# Patient Record
Sex: Female | Born: 1985 | Race: Black or African American | Hispanic: No | Marital: Married | Smoking: Current every day smoker
Health system: Southern US, Community
[De-identification: ages and names within clinical notes are randomized; demographics above are authoritative.]

## PROBLEM LIST (undated history)

## (undated) ENCOUNTER — Inpatient Hospital Stay (HOSPITAL_COMMUNITY): Payer: Self-pay

## (undated) ENCOUNTER — Emergency Department (HOSPITAL_COMMUNITY): Discharge status: Absent without leave | Payer: Medicaid Other

## (undated) DIAGNOSIS — F32A Depression, unspecified: Secondary | ICD-10-CM

## (undated) DIAGNOSIS — A64 Unspecified sexually transmitted disease: Secondary | ICD-10-CM

## (undated) DIAGNOSIS — F329 Major depressive disorder, single episode, unspecified: Secondary | ICD-10-CM

## (undated) DIAGNOSIS — Z8619 Personal history of other infectious and parasitic diseases: Secondary | ICD-10-CM

## (undated) DIAGNOSIS — N926 Irregular menstruation, unspecified: Principal | ICD-10-CM

## (undated) DIAGNOSIS — F419 Anxiety disorder, unspecified: Secondary | ICD-10-CM

## (undated) DIAGNOSIS — B009 Herpesviral infection, unspecified: Secondary | ICD-10-CM

## (undated) DIAGNOSIS — I10 Essential (primary) hypertension: Secondary | ICD-10-CM

## (undated) DIAGNOSIS — R350 Frequency of micturition: Secondary | ICD-10-CM

## (undated) DIAGNOSIS — Z3490 Encounter for supervision of normal pregnancy, unspecified, unspecified trimester: Secondary | ICD-10-CM

## (undated) HISTORY — DX: Unspecified sexually transmitted disease: A64

## (undated) HISTORY — PX: NO PAST SURGERIES: SHX2092

## (undated) HISTORY — DX: Irregular menstruation, unspecified: N92.6

## (undated) HISTORY — DX: Frequency of micturition: R35.0

## (undated) HISTORY — DX: Herpesviral infection, unspecified: B00.9

## (undated) HISTORY — DX: Personal history of other infectious and parasitic diseases: Z86.19

---
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
## 2007-08-23 ENCOUNTER — Other Ambulatory Visit: Admission: RE | Admit: 2007-08-23 | Discharge: 2007-08-23 | Payer: Self-pay | Admitting: Obstetrics and Gynecology

## 2008-01-22 ENCOUNTER — Inpatient Hospital Stay (HOSPITAL_COMMUNITY): Admission: AD | Admit: 2008-01-22 | Discharge: 2008-01-22 | Payer: Self-pay | Admitting: Obstetrics & Gynecology

## 2008-01-22 ENCOUNTER — Other Ambulatory Visit: Payer: Self-pay | Admitting: Emergency Medicine

## 2008-01-22 ENCOUNTER — Ambulatory Visit: Payer: Self-pay | Admitting: *Deleted

## 2008-02-01 ENCOUNTER — Inpatient Hospital Stay (HOSPITAL_COMMUNITY): Admission: AD | Admit: 2008-02-01 | Discharge: 2008-02-03 | Payer: Self-pay | Admitting: Obstetrics and Gynecology

## 2008-02-01 ENCOUNTER — Ambulatory Visit: Payer: Self-pay | Admitting: *Deleted

## 2008-08-31 ENCOUNTER — Other Ambulatory Visit: Admission: RE | Admit: 2008-08-31 | Discharge: 2008-08-31 | Payer: Self-pay | Admitting: Obstetrics and Gynecology

## 2009-07-25 ENCOUNTER — Inpatient Hospital Stay (HOSPITAL_COMMUNITY): Admission: AD | Admit: 2009-07-25 | Discharge: 2009-07-27 | Payer: Self-pay | Admitting: Obstetrics and Gynecology

## 2009-12-15 ENCOUNTER — Other Ambulatory Visit: Admission: RE | Admit: 2009-12-15 | Discharge: 2009-12-15 | Payer: Self-pay | Admitting: Obstetrics and Gynecology

## 2010-11-07 ENCOUNTER — Emergency Department (HOSPITAL_COMMUNITY): Admission: EM | Admit: 2010-11-07 | Discharge: 2010-11-07 | Payer: Self-pay | Admitting: Emergency Medicine

## 2011-01-07 ENCOUNTER — Encounter: Payer: Self-pay | Admitting: *Deleted

## 2011-02-28 LAB — URINALYSIS, ROUTINE W REFLEX MICROSCOPIC
Bilirubin Urine: NEGATIVE
Glucose, UA: NEGATIVE mg/dL
Hgb urine dipstick: NEGATIVE
Protein, ur: 30 mg/dL — AB
Urobilinogen, UA: 1 mg/dL (ref 0.0–1.0)

## 2011-02-28 LAB — URINE MICROSCOPIC-ADD ON

## 2011-03-14 ENCOUNTER — Emergency Department (HOSPITAL_COMMUNITY)
Admission: EM | Admit: 2011-03-14 | Discharge: 2011-03-14 | Disposition: A | Discharge status: Home / Self Care | Payer: Medicaid Other | Attending: Emergency Medicine | Admitting: Emergency Medicine

## 2011-03-14 DIAGNOSIS — N946 Dysmenorrhea, unspecified: Secondary | ICD-10-CM | POA: Insufficient documentation

## 2011-03-14 LAB — URINE MICROSCOPIC-ADD ON

## 2011-03-14 LAB — URINALYSIS, ROUTINE W REFLEX MICROSCOPIC
Bilirubin Urine: NEGATIVE
Nitrite: NEGATIVE
Specific Gravity, Urine: 1.025 (ref 1.005–1.030)
Urobilinogen, UA: 0.2 mg/dL (ref 0.0–1.0)
pH: 7 (ref 5.0–8.0)

## 2011-03-25 LAB — CBC
MCHC: 32.5 g/dL (ref 30.0–36.0)
MCV: 80.8 fL (ref 78.0–100.0)
MCV: 81.6 fL (ref 78.0–100.0)
Platelets: 196 10*3/uL (ref 150–400)
Platelets: 230 10*3/uL (ref 150–400)
RDW: 14.8 % (ref 11.5–15.5)
WBC: 17.2 10*3/uL — ABNORMAL HIGH (ref 4.0–10.5)

## 2011-03-25 LAB — COMPREHENSIVE METABOLIC PANEL
ALT: 10 U/L (ref 0–35)
AST: 27 U/L (ref 0–37)
Albumin: 2.1 g/dL — ABNORMAL LOW (ref 3.5–5.2)
Albumin: 2.6 g/dL — ABNORMAL LOW (ref 3.5–5.2)
Alkaline Phosphatase: 159 U/L — ABNORMAL HIGH (ref 39–117)
Alkaline Phosphatase: 195 U/L — ABNORMAL HIGH (ref 39–117)
BUN: 1 mg/dL — ABNORMAL LOW (ref 6–23)
CO2: 19 mEq/L (ref 19–32)
CO2: 20 mEq/L (ref 19–32)
Calcium: 8.5 mg/dL (ref 8.4–10.5)
Chloride: 109 mEq/L (ref 96–112)
Chloride: 110 mEq/L (ref 96–112)
Creatinine, Ser: 0.41 mg/dL (ref 0.4–1.2)
Creatinine, Ser: 0.54 mg/dL (ref 0.4–1.2)
GFR calc Af Amer: >60 mL/min (ref >60)
GFR calc Af Amer: >60 mL/min (ref >60)
GFR calc non Af Amer: >60 mL/min (ref >60)
GFR calc non Af Amer: >60 mL/min (ref >60)
Glucose, Bld: 109 mg/dL — ABNORMAL HIGH (ref 70–99)
Potassium: 3.3 mEq/L — ABNORMAL LOW (ref 3.5–5.1)
Sodium: 138 mEq/L (ref 135–145)
Total Bilirubin: 0.6 mg/dL (ref 0.3–1.2)

## 2011-03-25 LAB — URINALYSIS, DIPSTICK ONLY
Nitrite: NEGATIVE
Protein, ur: NEGATIVE mg/dL
Specific Gravity, Urine: 1.01 (ref 1.005–1.030)
Urobilinogen, UA: 0.2 mg/dL (ref 0.0–1.0)

## 2011-03-25 LAB — RPR: RPR Ser Ql: NONREACTIVE

## 2011-03-25 LAB — URIC ACID: Uric Acid, Serum: 6.7 mg/dL (ref 2.4–7.0)

## 2011-03-25 LAB — LACTATE DEHYDROGENASE: LDH: 168 U/L (ref 94–250)

## 2011-05-30 ENCOUNTER — Emergency Department (HOSPITAL_COMMUNITY)
Admission: EM | Admit: 2011-05-30 | Discharge: 2011-05-30 | Disposition: A | Discharge status: Home / Self Care | Payer: Medicaid Other | Attending: Emergency Medicine | Admitting: Emergency Medicine

## 2011-05-30 ENCOUNTER — Emergency Department (HOSPITAL_COMMUNITY): Payer: Medicaid Other

## 2011-05-30 DIAGNOSIS — M79609 Pain in unspecified limb: Secondary | ICD-10-CM | POA: Insufficient documentation

## 2011-05-30 DIAGNOSIS — Z79899 Other long term (current) drug therapy: Secondary | ICD-10-CM | POA: Insufficient documentation

## 2011-05-30 DIAGNOSIS — I10 Essential (primary) hypertension: Secondary | ICD-10-CM | POA: Insufficient documentation

## 2011-09-08 LAB — CBC
HCT: 26.9 — ABNORMAL LOW
Hemoglobin: 9 — ABNORMAL LOW
MCHC: 33.8
MCV: 81
Platelets: 251
Platelets: 260
RDW: 13.8
WBC: 10.3
WBC: 11.9 — ABNORMAL HIGH

## 2011-09-08 LAB — COMPREHENSIVE METABOLIC PANEL
AST: 17
Albumin: 2.3 — ABNORMAL LOW
Albumin: 2.5 — ABNORMAL LOW
Alkaline Phosphatase: 99
BUN: 5 — ABNORMAL LOW
Calcium: 8.5
Chloride: 103
Creatinine, Ser: 0.46
GFR calc Af Amer: >60
Potassium: 3.6
Total Bilirubin: 0.7
Total Protein: 6.7

## 2011-09-08 LAB — URINE CULTURE

## 2011-09-08 LAB — URINALYSIS, ROUTINE W REFLEX MICROSCOPIC
Bilirubin Urine: NEGATIVE
Bilirubin Urine: NEGATIVE
Glucose, UA: NEGATIVE
Ketones, ur: NEGATIVE
Protein, ur: 100 — AB
Specific Gravity, Urine: 1.02
pH: 7

## 2011-09-08 LAB — URINE MICROSCOPIC-ADD ON

## 2011-09-08 LAB — RAPID URINE DRUG SCREEN, HOSP PERFORMED
Amphetamines: NOT DETECTED
Barbiturates: NOT DETECTED
Benzodiazepines: NOT DETECTED
Opiates: NOT DETECTED

## 2011-11-13 ENCOUNTER — Other Ambulatory Visit (HOSPITAL_COMMUNITY)
Admission: RE | Admit: 2011-11-13 | Discharge: 2011-11-13 | Disposition: A | Discharge status: Home / Self Care | Payer: Medicaid Other | Source: Ambulatory Visit | Attending: Obstetrics and Gynecology | Admitting: Obstetrics and Gynecology

## 2011-11-13 ENCOUNTER — Other Ambulatory Visit: Payer: Self-pay | Admitting: Adult Health

## 2011-11-13 DIAGNOSIS — Z113 Encounter for screening for infections with a predominantly sexual mode of transmission: Secondary | ICD-10-CM | POA: Insufficient documentation

## 2011-11-13 DIAGNOSIS — Z01419 Encounter for gynecological examination (general) (routine) without abnormal findings: Secondary | ICD-10-CM | POA: Insufficient documentation

## 2011-11-13 LAB — OB RESULTS CONSOLE GC/CHLAMYDIA: Gonorrhea: NEGATIVE

## 2011-11-13 LAB — OB RESULTS CONSOLE RUBELLA ANTIBODY, IGM: Rubella: IMMUNE

## 2011-11-13 LAB — OB RESULTS CONSOLE ANTIBODY SCREEN: Antibody Screen: NEGATIVE

## 2011-11-13 LAB — OB RESULTS CONSOLE ABO/RH

## 2012-02-15 ENCOUNTER — Emergency Department (HOSPITAL_COMMUNITY): Payer: Medicaid Other

## 2012-02-15 ENCOUNTER — Encounter (HOSPITAL_COMMUNITY): Payer: Self-pay

## 2012-02-15 ENCOUNTER — Emergency Department (HOSPITAL_COMMUNITY)
Admission: EM | Admit: 2012-02-15 | Discharge: 2012-02-15 | Disposition: A | Discharge status: Home / Self Care | Payer: Medicaid Other | Attending: Emergency Medicine | Admitting: Emergency Medicine

## 2012-02-15 DIAGNOSIS — F17200 Nicotine dependence, unspecified, uncomplicated: Secondary | ICD-10-CM | POA: Insufficient documentation

## 2012-02-15 DIAGNOSIS — O99891 Other specified diseases and conditions complicating pregnancy: Secondary | ICD-10-CM | POA: Insufficient documentation

## 2012-02-15 DIAGNOSIS — R109 Unspecified abdominal pain: Secondary | ICD-10-CM | POA: Insufficient documentation

## 2012-02-15 LAB — DIFFERENTIAL
Basophils Absolute: 0 10*3/uL (ref 0.0–0.1)
Basophils Relative: 0 % (ref 0–1)
Eosinophils Absolute: 0.2 10*3/uL (ref 0.0–0.7)
Eosinophils Relative: 2 % (ref 0–5)
Monocytes Absolute: 0.6 10*3/uL (ref 0.1–1.0)
Monocytes Relative: 5 % (ref 3–12)

## 2012-02-15 LAB — URINE MICROSCOPIC-ADD ON

## 2012-02-15 LAB — URINALYSIS, ROUTINE W REFLEX MICROSCOPIC
Bilirubin Urine: NEGATIVE
Hgb urine dipstick: NEGATIVE
Protein, ur: 30 mg/dL — AB
Urobilinogen, UA: 1 mg/dL (ref 0.0–1.0)

## 2012-02-15 LAB — CBC
HCT: 32.9 % — ABNORMAL LOW (ref 36.0–46.0)
Hemoglobin: 11 g/dL — ABNORMAL LOW (ref 12.0–15.0)
MCH: 28.4 pg (ref 26.0–34.0)
MCHC: 33.4 g/dL (ref 30.0–36.0)
RDW: 13.4 % (ref 11.5–15.5)

## 2012-02-15 LAB — COMPREHENSIVE METABOLIC PANEL
Albumin: 2.8 g/dL — ABNORMAL LOW (ref 3.5–5.2)
BUN: 8 mg/dL (ref 6–23)
Calcium: 9.4 mg/dL (ref 8.4–10.5)
Creatinine, Ser: 0.43 mg/dL — ABNORMAL LOW (ref 0.50–1.10)
Total Bilirubin: 0.3 mg/dL (ref 0.3–1.2)
Total Protein: 7.1 g/dL (ref 6.0–8.3)

## 2012-02-15 NOTE — ED Notes (Signed)
Pt reports lower abd pain that began this a.m.  Pt denies any vaginal bleeding or discharge.  Pt reports the pains are sharp and her lower abd.  This is pt's 3rd pregnancy.

## 2012-02-15 NOTE — ED Notes (Signed)
[facility], they requested for [place] to put her on the toco monitor to see if she is contracting.

## 2012-02-15 NOTE — ED Provider Notes (Signed)
History     CSN: [id number]  Arrival date & time 02/15/12  1453   First MD Initiated Contact with Patient 02/15/12 [phone]      Chief Complaint  Patient presents with  . Abdominal Pain    (Consider location/radiation/quality/duration/timing/severity/associated sxs/prior treatment) HPI Comments: Patient is 5 months pregnant, started with sharp pains in the lower abd this morning.  Went away, them came back this afternoon.  No bowel or bladder complaints.    Patient is a 26 y.o. female presenting with cramps. The history is provided by the patient.  Abdominal Cramping The primary symptoms of the illness include abdominal pain. The primary symptoms of the illness do not include fever, nausea, vomiting, dysuria, vaginal discharge or vaginal bleeding. The problem has been gradually worsening.  The patient states that she believes she is currently pregnant. The patient has not had a change in bowel habit. Symptoms associated with the illness do not include chills.    History reviewed. No pertinent past medical history.  History reviewed. No pertinent past surgical history.  History reviewed. No pertinent family history.  History  Substance Use Topics  . Smoking status: Current Everyday Smoker    Types: Cigarettes  . Smokeless tobacco: Not on file  . Alcohol Use: No    OB History    Grav Para Term Preterm Abortions TAB SAB Ect Mult Living   3 2 2  0 0 0 0 0 0 2      Review of Systems  Constitutional: Negative for fever and chills.  Gastrointestinal: Positive for abdominal pain. Negative for nausea and vomiting.  Genitourinary: Negative for dysuria, vaginal bleeding and vaginal discharge.  All other systems reviewed and are negative.    Allergies  Motrin  Home Medications  No current outpatient prescriptions on file.  BP 115/78  Pulse 102  Temp(Src) 98.1 F (36.7 C) (Oral)  Resp 17  Ht 5\' 2"  (1.575 m)  Wt 171 lb 8 oz (77.792 kg)  BMI 31.37 kg/m2  SpO2  100%  Physical Exam  Nursing note and vitals reviewed. Constitutional: She is oriented to person, place, and time. She appears well-developed and well-nourished. No distress.  HENT:  Head: Normocephalic and atraumatic.  Neck: Normal range of motion. Neck supple.  Cardiovascular: Normal rate and regular rhythm.  Exam reveals no gallop and no friction rub.   No murmur heard. Pulmonary/Chest: Effort normal and breath sounds normal. No respiratory distress. She has no wheezes.  Abdominal: Soft. Bowel sounds are normal.       There is mild ttp suprapubically.  No rebound or guarding.  The fundal height is consistent with gestational age.  Musculoskeletal: Normal range of motion.  Neurological: She is alert and oriented to person, place, and time.  Skin: Skin is warm and dry. She is not diaphoretic.    ED Course  Procedures (including critical care time)   Labs Reviewed  URINALYSIS, ROUTINE W REFLEX MICROSCOPIC   No results found.   No diagnosis found.    MDM  The patient was monitored and was not contracting.  The urine showed slight protein but was okay otherwise.  The blood pressure is normal.  The ultrasound showed no complications.  Will discharge to home, to follow up prn.        Geoffery Lyons, MD 02/15/12 1758

## 2012-02-15 NOTE — Discharge Instructions (Signed)
Abdominal Pain During Pregnancy Abdominal discomfort is common in pregnancy. Most of the time, it does not cause harm. There are many causes of abdominal pain. Some causes are more serious than others. Some of the causes of abdominal pain in pregnancy are easily diagnosed. Occasionally, the diagnosis takes time to understand. Other times, the cause is not determined. Abdominal pain can be a sign that something is very wrong with the pregnancy, or the pain may have nothing to do with the pregnancy at all. For this reason, always tell your caregiver if you have any abdominal discomfort. CAUSES Common and harmless causes of abdominal pain include:  Constipation.   Excess gas and bloating.   Round ligament pain. This is pain that is felt in the folds of the groin.   The position the baby or placenta is in.   Baby kicks.   Braxton-Hicks contractions. These are mild contractions that do not cause cervical dilation.  Serious causes of abdominal pain include:  Ectopic pregnancy. This happens when a fertilized egg implants outside of the uterus.   Miscarriage.   Preterm labor. This is when labor starts at less than 37 weeks of pregnancy.   Placental abruption. This is when the placenta partially or completely separates from the uterus.   Preeclampsia. This is often associated with high blood pressure and has been referred to as "toxemia in pregnancy."   Uterine or amniotic fluid infections.  Causes unrelated to pregnancy include:  Urinary tract infection.   Gallbladder stones or inflammation.   Hepatitis or other liver illness.   Intestinal problems, stomach flu, food poisoning, or ulcer.   Appendicitis.   Kidney (renal) stones.   Kidney infection (pylonephritis).  HOME CARE INSTRUCTIONS  For mild pain:  Do not have sexual intercourse or put anything in your vagina until your symptoms go away completely.   Get plenty of rest until your pain improves. If your pain does not  improve in 1 hour, call your caregiver.   Drink clear fluids if you feel nauseous. Avoid solid food as long as you are uncomfortable or nauseous.   Only take medicine as directed by your caregiver.   Keep all follow-up appointments with your caregiver.  SEEK IMMEDIATE MEDICAL CARE IF:  You are bleeding, leaking fluid, or passing tissue from the vagina.   You have increasing pain or cramping.   You have persistent vomiting.   You have painful or bloody urination.   You have a fever.   You notice a decrease in your baby's movements.   You have extreme weakness or feel faint.   You have shortness of breath, with or without abdominal pain.   You develop a severe headache with abdominal pain.   You have abnormal vaginal discharge with abdominal pain.   You have persistent diarrhea.   You have abdominal pain that continues even after rest, or gets worse.  MAKE SURE YOU:   Understand these instructions.   Will watch your condition.   Will get help right away if you are not doing well or get worse.  Document Released: 12/04/2005 Document Revised: 08/16/2011 Document Reviewed: 06/30/2011 Exit[employer] Patient Information 2012 [employer]. 

## 2012-02-15 NOTE — Progress Notes (Addendum)
Called by [facility] to advise of Stephanie Cook arrival.  Upon apprisel of [facility], informed [facility] RN that Doppler tones should be assessed but EFM will likely not be an effective means of monitoring.  Requested that toco be applied, as the pt's presenting complaint is abdominal pain.  Will monitor remotely.

## 2012-02-15 NOTE — ED Notes (Signed)
Spoke with Joy at [place].  She states that she doesn't see any reason to keep pt on the monitor.  Notified edp

## 2012-02-15 NOTE — ED Notes (Signed)
Complain of pain in pelvic area that started around 1000 this morning. Denies other symptoms

## 2012-06-21 ENCOUNTER — Telehealth (HOSPITAL_COMMUNITY): Payer: Self-pay | Admitting: *Deleted

## 2012-06-21 ENCOUNTER — Encounter (HOSPITAL_COMMUNITY): Payer: Self-pay | Admitting: *Deleted

## 2012-06-21 NOTE — Telephone Encounter (Signed)
Preadmission screen  

## 2012-12-18 NOTE — L&D Delivery Note (Signed)
Delivery Note At 7:49 AM a viable female was delivered via Vaginal, Spontaneous Delivery (Presentation: Occiput Anterior).  APGAR: 9, 9; weight pending .  Placenta status: spontaneous, intact.  Cord: 3 vessels with the following complications: None.  Cord pH: not sent.  Parents bonding with infant.  Anesthesia: Epidural  Episiotomy: None Lacerations: None Suture Repair: N/A Est. Blood Loss (mL):   Mom to postpartum.  Baby to Couplet care / Skin to Skin.  [name] 10/26/2013, 8:33 AM  I attended this delivery with student No difficulty with shoulders. Baby vigorous. Agree with note. Aviva Signs, CNM

## 2013-01-04 ENCOUNTER — Encounter (HOSPITAL_COMMUNITY): Payer: Self-pay

## 2013-01-04 ENCOUNTER — Emergency Department (HOSPITAL_COMMUNITY)
Admission: EM | Admit: 2013-01-04 | Discharge: 2013-01-04 | Disposition: A | Discharge status: Home / Self Care | Payer: Medicaid Other | Attending: Emergency Medicine | Admitting: Emergency Medicine

## 2013-01-04 DIAGNOSIS — R197 Diarrhea, unspecified: Secondary | ICD-10-CM | POA: Insufficient documentation

## 2013-01-04 DIAGNOSIS — I10 Essential (primary) hypertension: Secondary | ICD-10-CM | POA: Insufficient documentation

## 2013-01-04 DIAGNOSIS — Z331 Pregnant state, incidental: Secondary | ICD-10-CM

## 2013-01-04 DIAGNOSIS — F17200 Nicotine dependence, unspecified, uncomplicated: Secondary | ICD-10-CM | POA: Insufficient documentation

## 2013-01-04 DIAGNOSIS — R112 Nausea with vomiting, unspecified: Secondary | ICD-10-CM

## 2013-01-04 HISTORY — DX: Essential (primary) hypertension: I10

## 2013-01-04 LAB — URINALYSIS, ROUTINE W REFLEX MICROSCOPIC
Bilirubin Urine: NEGATIVE
Ketones, ur: NEGATIVE mg/dL
Nitrite: NEGATIVE
Specific Gravity, Urine: >1.03 — ABNORMAL HIGH (ref 1.005–1.030)
Urobilinogen, UA: 0.2 mg/dL (ref 0.0–1.0)

## 2013-01-04 MED ORDER — ONDANSETRON HCL 4 MG PO TABS: 4.0000 mg | ORAL_TABLET | Freq: Three times a day (TID) | ORAL | Status: DC | PRN

## 2013-01-04 MED ORDER — PRENATAL COMPLETE 14-0.4 MG PO TABS: 1.0000 | ORAL_TABLET | Freq: Every day | ORAL | Status: DC

## 2013-01-04 MED ORDER — ONDANSETRON HCL 4 MG/2ML IJ SOLN: 4.0000 mg | INTRAMUSCULAR | Status: DC | PRN

## 2013-01-04 MED ORDER — SODIUM CHLORIDE 0.9 % IV BOLUS (SEPSIS): 500.0000 mL | Freq: Once | INTRAVENOUS | Status: DC

## 2013-01-04 MED ORDER — SODIUM CHLORIDE 0.9 % IV SOLN: INTRAVENOUS | Status: DC

## 2013-01-04 NOTE — ED Notes (Signed)
Sprite given for oral trial.  

## 2013-01-04 NOTE — ED Notes (Signed)
Pt stated prior to d/c that she took a pregnancy test this am and came here to make sure. Nad. Held sprite down fine

## 2013-01-04 NOTE — ED Notes (Signed)
abdominal pain and vomiting

## 2013-01-04 NOTE — ED Provider Notes (Signed)
History     CSN: [id number]  Arrival date & time 01/04/13  1433   First MD Initiated Contact with Patient 01/04/13 1444      Chief Complaint  Patient presents with  . Abdominal Pain    HPI Pt was seen at 1445.  Per pt, c/o gradual onset and persistence of multiple intermittent episodes of N/V/D that began this morning.   Describes the 3 diarrhea stools she had today as "watery."  Has been associated with generalized "cramping" abd pain. Denies CP/SOB, no back pain, no fevers, no black or blood in stools or emesis, no vaginal bleeding/discharge, no dysuria.     Past Medical History  Diagnosis Date  . Hypertension     History reviewed. No pertinent past surgical history.   History  Substance Use Topics  . Smoking status: Current Every Day Smoker    Types: Cigarettes  . Smokeless tobacco: Not on file  . Alcohol Use: No    OB History    Grav Para Term Preterm Abortions TAB SAB Ect Mult Living   3 2 2  0 0 0 0 0 0 2      Review of Systems ROS: Statement: All systems negative except as marked or noted in the HPI; Constitutional: Negative for fever and chills. ; ; Eyes: Negative for eye pain, redness and discharge. ; ; ENMT: Negative for ear pain, hoarseness, nasal congestion, sinus pressure and sore throat. ; ; Cardiovascular: Negative for chest pain, palpitations, diaphoresis, dyspnea and peripheral edema. ; ; Respiratory: Negative for cough, wheezing and stridor. ; ; Gastrointestinal: +N/V/D, abd pain. Negative for blood in stool, hematemesis, jaundice and rectal bleeding. . ; ; Genitourinary: Negative for dysuria, flank pain and hematuria. ; ; GYN:  No vaginal bleeding, no vaginal discharge, no vulvar pain.;; Musculoskeletal: Negative for back pain and neck pain. Negative for swelling and trauma.; ; Skin: Negative for pruritus, rash, abrasions, blisters, bruising and skin lesion.; ; Neuro: Negative for headache, lightheadedness and neck stiffness. Negative for weakness, altered  level of consciousness , altered mental status, extremity weakness, paresthesias, involuntary movement, seizure and syncope.       Allergies  Motrin  Home Medications  No current outpatient prescriptions on file.  BP 129/92  Pulse 88  Temp 97.4 F (36.3 C) (Oral)  Resp 20  Ht 5\' 2"  (1.575 m)  Wt 149 lb (67.586 kg)  BMI 27.25 kg/m2  SpO2 100%  Physical Exam 1450: Physical examination:  Nursing notes reviewed; Vital signs and O2 SAT reviewed;  Constitutional: Well developed, Well nourished, Well hydrated, In no acute distress; Head:  Normocephalic, atraumatic; Eyes: EOMI, PERRL, No scleral icterus; ENMT: Mouth and pharynx normal, Mucous membranes moist; Neck: Supple, Full range of motion, No lymphadenopathy; Cardiovascular: Regular rate and rhythm, No murmur, rub, or gallop; Respiratory: Breath sounds clear & equal bilaterally, No rales, rhonchi, wheezes.  Speaking full sentences with ease, Normal respiratory effort/excursion; Chest: Nontender, Movement normal; Abdomen: Soft. Nontender. Nondistended, Normal bowel sounds; Genitourinary: No CVA tenderness; Extremities: Pulses normal, No tenderness, No edema, No calf edema or asymmetry.; Neuro: AA&Ox3, Major CN grossly intact.  Speech clear. Climbs on and off stretcher by herself without difficulty. Gait steady. No gross focal motor or sensory deficits in extremities.; Skin: Color normal, Warm, Dry.   ED Course  Procedures      MDM  MDM Reviewed: nursing note, vitals and previous chart Interpretation: labs     Results for orders placed during the hospital encounter of 01/04/13  URINALYSIS,  ROUTINE W REFLEX MICROSCOPIC      Component Value Range   Color, Urine YELLOW  YELLOW   APPearance CLEAR  CLEAR   Specific Gravity, Urine >1.030 (*) 1.005 - 1.030   pH 6.0  5.0 - 8.0   Glucose, UA NEGATIVE  NEGATIVE mg/dL   Hgb urine dipstick NEGATIVE  NEGATIVE   Bilirubin Urine NEGATIVE  NEGATIVE   Ketones, ur NEGATIVE  NEGATIVE mg/dL     Protein, ur NEGATIVE  NEGATIVE mg/dL   Urobilinogen, UA 0.2  0.0 - 1.0 mg/dL   Nitrite NEGATIVE  NEGATIVE   Leukocytes, UA NEGATIVE  NEGATIVE  PREGNANCY, URINE      Component Value Range   Preg Test, Ur POSITIVE (*) NEGATIVE     1520:  Pt refuses blood work, IVF, zofran. Now states to myself and ED RN that she came to the ED today because she "just wanted a pregnancy test."  States her LMP was the 1st week of December (last month), and she took a home pregnancy test this morning that "was positive." Came to the ED today only to confirm her findings. Denies abd and pelvic pain as her reason for coming to the ED. Continues to refuse labs, IVF, anti-emetic, stating to myself and ED RN that she was just here for a pregnancy test.  Abd continues benign on re-exam.  Has been playing on her handheld electronic device, watching TV and laughing/talking with significant other at bedside without distress.  Has tol PO well while in the ED without N/V.  Wants to go home now.  Dx and testing d/w pt and significant other.  Questions answered.  Verb understanding, agreeable to d/c home with outpt f/u.            Laray Anger, DO 01/06/13 1302

## 2013-01-09 ENCOUNTER — Encounter (HOSPITAL_COMMUNITY): Payer: Self-pay | Admitting: Emergency Medicine

## 2013-01-09 ENCOUNTER — Emergency Department (HOSPITAL_COMMUNITY)
Admission: EM | Admit: 2013-01-09 | Discharge: 2013-01-09 | Disposition: A | Discharge status: Home / Self Care | Payer: Medicaid Other | Attending: Emergency Medicine | Admitting: Emergency Medicine

## 2013-01-09 DIAGNOSIS — O209 Hemorrhage in early pregnancy, unspecified: Secondary | ICD-10-CM | POA: Insufficient documentation

## 2013-01-09 DIAGNOSIS — R109 Unspecified abdominal pain: Secondary | ICD-10-CM | POA: Insufficient documentation

## 2013-01-09 DIAGNOSIS — O9989 Other specified diseases and conditions complicating pregnancy, childbirth and the puerperium: Secondary | ICD-10-CM | POA: Insufficient documentation

## 2013-01-09 DIAGNOSIS — H9209 Otalgia, unspecified ear: Secondary | ICD-10-CM | POA: Insufficient documentation

## 2013-01-09 DIAGNOSIS — I10 Essential (primary) hypertension: Secondary | ICD-10-CM | POA: Insufficient documentation

## 2013-01-09 DIAGNOSIS — R350 Frequency of micturition: Secondary | ICD-10-CM | POA: Insufficient documentation

## 2013-01-09 DIAGNOSIS — N949 Unspecified condition associated with female genital organs and menstrual cycle: Secondary | ICD-10-CM | POA: Insufficient documentation

## 2013-01-09 DIAGNOSIS — R197 Diarrhea, unspecified: Secondary | ICD-10-CM | POA: Insufficient documentation

## 2013-01-09 DIAGNOSIS — O99330 Smoking (tobacco) complicating pregnancy, unspecified trimester: Secondary | ICD-10-CM | POA: Insufficient documentation

## 2013-01-09 LAB — WET PREP, GENITAL: WBC, Wet Prep HPF POC: NONE SEEN

## 2013-01-09 LAB — HCG, QUANTITATIVE, PREGNANCY: hCG, Beta Chain, Quant, S: 755 m[IU]/mL — ABNORMAL HIGH (ref <5)

## 2013-01-09 NOTE — ED Provider Notes (Signed)
History     CSN: [id number]  Arrival date & time 01/09/13  2136   None     Chief Complaint  Patient presents with  . Vaginal Bleeding  . Abdominal Pain   HPI Stephanie Cook is a 27 y.o. G4 P3 @ approximately 7 weeks 3 days gestation who presents to the ED with vaginal bleeding. The bleeding started today. She describes the bleeding as lighter than a period. LMP 11/17/12. Last pap smear more than one year ago with Dr. Emelda Fear and was normal. Vaginal deliver x 3. Two full term and one delivery at [redacted] weeks gestation. Current sex partner x 8 months. Hx of trichomonas. No birth control. Last sexual intercourse 2 hours prior to arrival to the ED and bleeding started after that. Mild cramping after intercourse.The history was provided by the patient.   Past Medical History  Diagnosis Date  . Hypertension     History reviewed. No pertinent past surgical history.  History reviewed. No pertinent family history.  History  Substance Use Topics  . Smoking status: Current Every Day Smoker    Types: Cigarettes  . Smokeless tobacco: Not on file  . Alcohol Use: No    OB History    Grav Para Term Preterm Abortions TAB SAB Ect Mult Living   4 2 2  0 0 0 0 0 0 2      Review of Systems  Constitutional: Negative for fever, chills, diaphoresis and fatigue.  HENT: Positive for ear pain. Negative for congestion, sore throat, facial swelling, neck pain, neck stiffness, dental problem and sinus pressure.   Eyes: Negative for photophobia, pain and discharge.  Respiratory: Negative for cough, chest tightness and wheezing.   Gastrointestinal: Positive for abdominal pain (cramping) and diarrhea. Negative for nausea, vomiting, constipation and abdominal distention.  Genitourinary: Positive for frequency, vaginal bleeding and pelvic pain. Negative for dysuria, flank pain, vaginal discharge, difficulty urinating and vaginal pain.  Musculoskeletal: Negative for myalgias, back pain and gait problem.    Skin: Negative for color change and rash.  Neurological: Negative for dizziness, speech difficulty, weakness, light-headedness, numbness and headaches.  Psychiatric/Behavioral: Negative for confusion and agitation. The patient is not nervous/anxious.     Allergies  Motrin  Home Medications   Current Outpatient Rx  Name  Route  Sig  Dispense  Refill  . ONDANSETRON HCL 4 MG PO TABS   Oral   Take 1 tablet (4 mg total) by mouth every 8 (eight) hours as needed for nausea.   6 tablet   0   . PRENATAL COMPLETE 14-0.4 MG PO TABS   Oral   Take 1 tablet by mouth daily.   30 each   0     BP 150/105  Pulse 102  Temp 98.4 F (36.9 C) (Oral)  Resp 20  Ht 5\' 2"  (1.575 m)  Wt 164 lb (74.39 kg)  BMI 30.00 kg/m2  SpO2 100%  LMP 11/17/2012  Physical Exam  Nursing note and vitals reviewed. Constitutional: She is oriented to person, place, and time. She appears well-developed and well-nourished.  HENT:  Head: Normocephalic and atraumatic.  Eyes: EOM are normal. Pupils are equal, round, and reactive to light.  Neck: Neck supple.  Cardiovascular: Normal rate and regular rhythm.   Pulmonary/Chest: No respiratory distress. She has no wheezes.  Abdominal: Soft. There is no tenderness.  Genitourinary:       External genitalia without lesions. Small blood vaginal vault. Cervix long, closed, no CMT, no adnexal tenderness. Uterus  approximately 8 week size.  Musculoskeletal: Normal range of motion. She exhibits no edema.  Neurological: She is alert and oriented to person, place, and time. No cranial nerve deficit.  Skin: Skin is warm and dry.  Psychiatric: She has a normal mood and affect. Her behavior is normal. Judgment and thought content normal.   Results for orders placed during the hospital encounter of 01/09/13 (from the past 24 hour(s))  HCG, QUANTITATIVE, PREGNANCY     Status: Abnormal   Collection Time   01/09/13  9:52 PM      Component Value Range   hCG, Beta Chain, Quant, S  755 (*) <5 mIU/mL  ABO/RH     Status: Normal   Collection Time   01/09/13  9:52 PM      Component Value Range   ABO/RH(D) B POS    WET PREP, GENITAL     Status: Abnormal   Collection Time   01/09/13 10:45 PM      Component Value Range   Yeast Wet Prep HPF POC NONE SEEN  NONE SEEN   Trich, Wet Prep NONE SEEN  NONE SEEN   Clue Cells Wet Prep HPF POC FEW (*) NONE SEEN   WBC, Wet Prep HPF POC NONE SEEN  NONE SEEN    Procedures  Assessment: 27 y.o. female with vaginal bleeding in early pregnancy   Postcoital bleeding  Plan:  Consult with Dr. Debroah Loop @ [facility]    Follow up with Dr. Emelda Fear in the office Friday (call for appointment) will need repeat    Bhcg   Return here sooner for heavy bleeding, severe pain or other problems.    [place], [place] 01/09/13 [phone]

## 2013-01-09 NOTE — ED Notes (Signed)
Pt alert & oriented x4, stable gait. Patient given discharge instructions, paperwork & prescription(s). Patient  instructed to stop at the registration desk to finish any additional paperwork. Patient verbalized understanding. Pt left department w/ no further questions. 

## 2013-01-09 NOTE — ED Notes (Signed)
Pt c/o abd pain with vaginal bleeding that started tonight. Pt states she is pregnant and her last cycle was 11/17/2013

## 2013-01-10 NOTE — ED Provider Notes (Signed)
Medical screening examination/treatment/procedure(s) were performed by non-physician practitioner and as supervising physician I was immediately available for consultation/collaboration.   Dione Booze, MD 01/10/13 [phone]

## 2013-01-11 LAB — GC/CHLAMYDIA PROBE AMP: GC Probe RNA: NEGATIVE

## 2013-01-31 ENCOUNTER — Encounter (HOSPITAL_COMMUNITY): Payer: Self-pay | Admitting: *Deleted

## 2013-01-31 ENCOUNTER — Emergency Department (HOSPITAL_COMMUNITY)
Admission: EM | Admit: 2013-01-31 | Discharge: 2013-01-31 | Disposition: A | Discharge status: Home / Self Care | Payer: Medicaid Other | Attending: Emergency Medicine | Admitting: Emergency Medicine

## 2013-01-31 DIAGNOSIS — Z8742 Personal history of other diseases of the female genital tract: Secondary | ICD-10-CM | POA: Insufficient documentation

## 2013-01-31 DIAGNOSIS — R197 Diarrhea, unspecified: Secondary | ICD-10-CM | POA: Insufficient documentation

## 2013-01-31 DIAGNOSIS — R109 Unspecified abdominal pain: Secondary | ICD-10-CM | POA: Insufficient documentation

## 2013-01-31 DIAGNOSIS — N949 Unspecified condition associated with female genital organs and menstrual cycle: Secondary | ICD-10-CM | POA: Insufficient documentation

## 2013-01-31 DIAGNOSIS — R102 Pelvic and perineal pain: Secondary | ICD-10-CM

## 2013-01-31 DIAGNOSIS — I10 Essential (primary) hypertension: Secondary | ICD-10-CM | POA: Insufficient documentation

## 2013-01-31 DIAGNOSIS — R112 Nausea with vomiting, unspecified: Secondary | ICD-10-CM

## 2013-01-31 DIAGNOSIS — F17200 Nicotine dependence, unspecified, uncomplicated: Secondary | ICD-10-CM | POA: Insufficient documentation

## 2013-01-31 LAB — URINALYSIS, ROUTINE W REFLEX MICROSCOPIC
Ketones, ur: NEGATIVE mg/dL
Leukocytes, UA: NEGATIVE
Nitrite: NEGATIVE
Protein, ur: NEGATIVE mg/dL

## 2013-01-31 MED ORDER — HYDROCODONE-ACETAMINOPHEN 5-325 MG PO TABS
2.0000 | ORAL_TABLET | Freq: Once | ORAL | Status: AC
  Administered 2013-01-31: 2 via ORAL
  Filled 2013-01-31: qty 2

## 2013-01-31 MED ORDER — ONDANSETRON 8 MG PO TBDP
8.0000 mg | ORAL_TABLET | Freq: Once | ORAL | Status: AC
  Administered 2013-01-31: 8 mg via ORAL
  Filled 2013-01-31: qty 1

## 2013-01-31 MED ORDER — PROMETHAZINE HCL 25 MG PO TABS: 25.0000 mg | ORAL_TABLET | Freq: Four times a day (QID) | ORAL | Status: DC | PRN

## 2013-01-31 NOTE — ED Provider Notes (Signed)
History     CSN: [id number]  Arrival date & time 01/31/13  1630   First MD Initiated Contact with Patient 01/31/13 1825      Chief Complaint  Patient presents with  . Pelvic Pain  . Abdominal Pain    (Consider location/radiation/quality/duration/timing/severity/associated sxs/prior treatment) HPI Comments: 27 year old female who presents with lower abdominal pain, nausea vomiting and diarrhea. She states that her boyfriend has similar symptoms. She has had 3 episodes of vomiting today, 2 bowel movements which she states were voluminous and watery. There has been no blood in her stools, at this time her pain has improved significantly spontaneously and is currently 6/10 in intensity. The pain is located in the suprapubic region, it does not radiate and is not associated with dysuria, swelling, rashes, cough, fever. According to the medical record the patient had a spontaneous miscarriage in the last 4 weeks. She states that she saw her OB/GYN before and after the miscarriage and states that everything was fine for the last 3 weeks without pain, vaginal bleeding or any other complaints.  Patient is a 27 y.o. female presenting with pelvic pain and abdominal pain. The history is provided by the patient.  Pelvic Pain Associated symptoms include abdominal pain.  Abdominal Pain   Past Medical History  Diagnosis Date  . Hypertension     History reviewed. No pertinent past surgical history.  History reviewed. No pertinent family history.  History  Substance Use Topics  . Smoking status: Current Every Day Smoker    Types: Cigarettes  . Smokeless tobacco: Not on file  . Alcohol Use: No    OB History   Grav Para Term Preterm Abortions TAB SAB Ect Mult Living   4 2 2  0 0 0 0 0 0 2      Review of Systems  Gastrointestinal: Positive for abdominal pain.  Genitourinary: Positive for pelvic pain.  All other systems reviewed and are negative.    Allergies  Motrin  Home  Medications   Current Outpatient Rx  Name  Route  Sig  Dispense  Refill  . promethazine (PHENERGAN) 25 MG tablet   Oral   Take 1 tablet (25 mg total) by mouth every 6 (six) hours as needed for nausea.   12 tablet   0     BP 137/99  Pulse 74  Temp(Src) 98.2 F (36.8 C) (Oral)  Resp 18  Ht 5\' 2"  (1.575 m)  Wt 164 lb (74.39 kg)  BMI 29.99 kg/m2  SpO2 100%  LMP 12/22/2012  Breastfeeding? Unknown  Physical Exam  Nursing note and vitals reviewed. Constitutional: She appears well-developed and well-nourished. No distress.  HENT:  Head: Normocephalic and atraumatic.  Mouth/Throat: Oropharynx is clear and moist. No oropharyngeal exudate.  Eyes: Conjunctivae and EOM are normal. Pupils are equal, round, and reactive to light. Right eye exhibits no discharge. Left eye exhibits no discharge. No scleral icterus.  Neck: Normal range of motion. Neck supple. No JVD present. No thyromegaly present.  Cardiovascular: Normal rate, regular rhythm, normal heart sounds and intact distal pulses.  Exam reveals no gallop and no friction rub.   No murmur heard. Pulmonary/Chest: Effort normal and breath sounds normal. No respiratory distress. She has no wheezes. She has no rales.  Abdominal: Soft. Bowel sounds are normal. She exhibits no distension and no mass. There is tenderness ( Mild suprapubic tenderness, no guarding, no masses, no pain at McBurney's point, no right upper quadrant tenderness).  No peritoneal signs  Genitourinary:  No  CVA tenderness  Musculoskeletal: Normal range of motion. She exhibits no edema and no tenderness.  Lymphadenopathy:    She has no cervical adenopathy.  Neurological: She is alert. Coordination normal.  Skin: Skin is warm and dry. No rash noted. No erythema.  Psychiatric: She has a normal mood and affect. Her behavior is normal.    ED Course  Procedures (including critical care time)  Labs Reviewed  URINALYSIS, ROUTINE W REFLEX MICROSCOPIC   No results  found.   1. Pelvic pain   2. Nausea vomiting and diarrhea       MDM  The patient has symptoms consistent with a nausea vomiting and diarrhea syndrome. She has a significant other with similar symptoms. At this time the patient appears benign with vital signs which are reassuring. She will receive Zofran, hydrocodone for pain, urinalysis to rule out infection and I will perform a bedside ultrasound to evaluate for intrauterine pregnancy  I have performed a bedside US and there is no fluid in the uterus, bladder visualized.  No tenderness with [place] with deep palpation and pressure  UA clean, pt given meds for nausea / pain - appears well, well hydrated, will d/c home.     Vida Roller, MD 01/31/13 [phone]

## 2013-01-31 NOTE — ED Notes (Signed)
Pt had a miscarriage 3 weeks prior, woke up today around 10am with abdominal pain nausea, vomiting, diarrhea.

## 2013-01-31 NOTE — ED Notes (Signed)
Pt states she also feels like she may pass out.

## 2013-03-10 ENCOUNTER — Ambulatory Visit (INDEPENDENT_AMBULATORY_CARE_PROVIDER_SITE_OTHER): Payer: Medicaid Other | Admitting: Adult Health

## 2013-03-10 ENCOUNTER — Encounter: Payer: Self-pay | Admitting: Adult Health

## 2013-03-10 VITALS — BP 132/90 | Ht 63.0 in | Wt 159.0 lb

## 2013-03-10 DIAGNOSIS — Z3200 Encounter for pregnancy test, result unknown: Secondary | ICD-10-CM

## 2013-03-10 DIAGNOSIS — Z3202 Encounter for pregnancy test, result negative: Secondary | ICD-10-CM

## 2013-03-10 NOTE — Progress Notes (Signed)
Pt. Was here for pregnancy test only.

## 2013-03-12 ENCOUNTER — Other Ambulatory Visit: Payer: Self-pay | Admitting: Obstetrics & Gynecology

## 2013-03-12 DIAGNOSIS — O3680X0 Pregnancy with inconclusive fetal viability, not applicable or unspecified: Secondary | ICD-10-CM

## 2013-03-14 ENCOUNTER — Other Ambulatory Visit: Payer: Medicaid Other

## 2013-03-17 ENCOUNTER — Ambulatory Visit (INDEPENDENT_AMBULATORY_CARE_PROVIDER_SITE_OTHER): Payer: Medicaid Other

## 2013-03-17 DIAGNOSIS — O09299 Supervision of pregnancy with other poor reproductive or obstetric history, unspecified trimester: Secondary | ICD-10-CM

## 2013-03-17 DIAGNOSIS — O3680X0 Pregnancy with inconclusive fetal viability, not applicable or unspecified: Secondary | ICD-10-CM

## 2013-03-17 LAB — US OB TRANSVAGINAL

## 2013-03-17 NOTE — Progress Notes (Signed)
U/S-single Intrauterine Gest sac noted (no yolk sac or fetal pole noted yet) meas c/w 5+2wks,  cx long and closed, bilateral adnexa wnl no free fluid noted C.L. Noted on RT, would like to reck for viability in 7-10days

## 2013-03-18 ENCOUNTER — Encounter: Payer: Self-pay | Admitting: Advanced Practice Midwife

## 2013-03-18 ENCOUNTER — Other Ambulatory Visit: Payer: Self-pay | Admitting: Women's Health

## 2013-03-18 ENCOUNTER — Other Ambulatory Visit: Payer: Self-pay | Admitting: Family Medicine

## 2013-03-18 ENCOUNTER — Other Ambulatory Visit: Payer: Self-pay | Admitting: Obstetrics & Gynecology

## 2013-03-18 ENCOUNTER — Encounter: Payer: Medicaid Other | Admitting: Advanced Practice Midwife

## 2013-03-18 ENCOUNTER — Other Ambulatory Visit (HOSPITAL_COMMUNITY)
Admission: RE | Admit: 2013-03-18 | Discharge: 2013-03-18 | Disposition: A | Discharge status: Home / Self Care | Payer: Medicaid Other | Source: Ambulatory Visit | Attending: Obstetrics and Gynecology | Admitting: Obstetrics and Gynecology

## 2013-03-18 ENCOUNTER — Ambulatory Visit (INDEPENDENT_AMBULATORY_CARE_PROVIDER_SITE_OTHER): Payer: Medicaid Other | Admitting: Advanced Practice Midwife

## 2013-03-18 VITALS — BP 120/80 | Wt 151.8 lb

## 2013-03-18 DIAGNOSIS — Z3481 Encounter for supervision of other normal pregnancy, first trimester: Secondary | ICD-10-CM

## 2013-03-18 DIAGNOSIS — O09299 Supervision of pregnancy with other poor reproductive or obstetric history, unspecified trimester: Secondary | ICD-10-CM

## 2013-03-18 DIAGNOSIS — B009 Herpesviral infection, unspecified: Secondary | ICD-10-CM | POA: Insufficient documentation

## 2013-03-18 DIAGNOSIS — O0990 Supervision of high risk pregnancy, unspecified, unspecified trimester: Secondary | ICD-10-CM | POA: Insufficient documentation

## 2013-03-18 DIAGNOSIS — O09219 Supervision of pregnancy with history of pre-term labor, unspecified trimester: Secondary | ICD-10-CM

## 2013-03-18 DIAGNOSIS — Z331 Pregnant state, incidental: Secondary | ICD-10-CM

## 2013-03-18 DIAGNOSIS — O10019 Pre-existing essential hypertension complicating pregnancy, unspecified trimester: Secondary | ICD-10-CM

## 2013-03-18 DIAGNOSIS — Z1389 Encounter for screening for other disorder: Secondary | ICD-10-CM

## 2013-03-18 DIAGNOSIS — O10911 Unspecified pre-existing hypertension complicating pregnancy, first trimester: Secondary | ICD-10-CM

## 2013-03-18 DIAGNOSIS — O98519 Other viral diseases complicating pregnancy, unspecified trimester: Secondary | ICD-10-CM

## 2013-03-18 DIAGNOSIS — O99320 Drug use complicating pregnancy, unspecified trimester: Secondary | ICD-10-CM

## 2013-03-18 DIAGNOSIS — Z113 Encounter for screening for infections with a predominantly sexual mode of transmission: Secondary | ICD-10-CM | POA: Insufficient documentation

## 2013-03-18 DIAGNOSIS — Z01419 Encounter for gynecological examination (general) (routine) without abnormal findings: Secondary | ICD-10-CM | POA: Insufficient documentation

## 2013-03-18 LAB — CBC
Hemoglobin: 12.6 g/dL (ref 12.0–15.0)
MCHC: 33.3 g/dL (ref 30.0–36.0)
RDW: 13.3 % (ref 11.5–15.5)
WBC: 9.9 10*3/uL (ref 4.0–10.5)

## 2013-03-18 LAB — POCT URINALYSIS DIPSTICK
Blood, UA: NEGATIVE
Glucose, UA: NEGATIVE
Ketones, UA: NEGATIVE
Nitrite, UA: NEGATIVE

## 2013-03-18 LAB — HEPATITIS B SURFACE ANTIGEN: Hepatitis B Surface Ag: NEGATIVE

## 2013-03-18 LAB — HIV ANTIBODY (ROUTINE TESTING W REFLEX): HIV: NONREACTIVE

## 2013-03-18 MED ORDER — METHYLDOPA 250 MG PO TABS: 250.0000 mg | ORAL_TABLET | Freq: Two times a day (BID) | ORAL | Status: DC

## 2013-03-18 NOTE — Progress Notes (Signed)
  Subjective:    Stephanie Cook is a [id number] [redacted]w[redacted]d being seen today for her first obstetrical visit.  Her obstetrical history is significant for HX PTD @ 33wks; CHTN. Patient does intend to breast feed. Pregnancy history fully reviewed.  Patient reports no complaints.  Filed Vitals:   03/18/13 1144  BP: 120/80  Weight: 151 lb 12.8 oz (68.856 kg)    HISTORY: OB History   Grav Para Term Preterm Abortions TAB SAB Ect Mult Living   5 3 2 1 1  0 1 0 0 3     # Outc Date GA Lbr Len/2nd Wgt Sex Del Anes PTL Lv   1 TRM 2009 [redacted]w[redacted]d 11:00 6lb11oz(3.033kg) M SVD EPI  Yes   2 TRM 2010 [redacted]w[redacted]d 03:30 6lb7oz(2.92kg) F SVD EPI  Yes   3 PRE 5/13 [redacted]w[redacted]d  4lb14oz(2.211kg) F SVD None Yes Yes   Comments: System Generated. Please review and update pregnancy details.   4 SAB 1/14 [redacted]w[redacted]d          Comments: System Generated. Please review and update pregnancy details.   5 CUR              Past Medical History  Diagnosis Date  . Hypertension   . Preterm labor   . STD (female)     chlamydia, trichomonas, gonorrhea, HPV, HSV   Past Surgical History  Procedure Laterality Date  . No past surgeries     History reviewed. No pertinent family history.   Exam    Uterus:   normal   Pelvic Exam:    Perineum: Normal Perineum   Vulva: normal   Vagina:  normal mucosa, normal discharge, no palpable nodules       Cervix: normal   Adnexa: Not palpable      System: Breast:  normal appearance, no masses or tenderness   Skin: normal coloration and turgor, no rashes    Neurologic: oriented, normal, normal mood   Extremities: normal strength, tone, and muscle mass   HEENT PERRLA   Mouth/Teeth mucous membranes moist, pharynx normal without lesions   Neck supple and no masses   Cardiovascular: regular rate and rhythm   Respiratory:  appears well, vitals normal, no respiratory distress, acyanotic, normal RR   Abdomen: soft, non-tender; bowel sounds normal; no masses,  no organomegaly   Urinary: urethral  meatus normal      Assessment:    Pregnancy: [id number] Patient Active Problem List  Diagnosis  . Supervision of high-risk pregnancy  . H/O preterm delivery, currently pregnant  . Chronic hypertension complicating or reason for care during pregnancy  . HSV-2 infection        Plan:    Change HTN meds to Aldomet 250mg  BID Initial labs drawn. Prenatal vitamins. Problem list reviewed and updated. Genetic Screening discussed Integrated Screen: requested.  Ultrasound discussed; fetal survey: requested.  Follow up in 1 weeks for f/u US   CRESENZO-DISHMAN,Justinn Welter 03/18/2013

## 2013-03-18 NOTE — Telephone Encounter (Signed)
Changed from norvasc and hctz due to pregnancy Stephanie Cook

## 2013-03-18 NOTE — Progress Notes (Signed)
New OB packet given to pt.

## 2013-03-19 LAB — SICKLE CELL SCREEN: Sickle Cell Screen: NEGATIVE

## 2013-03-19 LAB — URINALYSIS
Glucose, UA: NEGATIVE mg/dL
Hgb urine dipstick: NEGATIVE
pH: 6 (ref 5.0–8.0)

## 2013-03-19 LAB — VARICELLA ZOSTER ANTIBODY, IGG: Varicella IgG: 958.5 Index — ABNORMAL HIGH (ref <135.00)

## 2013-03-19 LAB — DRUG SCREEN, URINE, NO CONFIRMATION
Amphetamine Screen, Ur: NEGATIVE
Barbiturate Quant, Ur: NEGATIVE
Cocaine Metabolites: NEGATIVE
Marijuana Metabolite: POSITIVE — AB
Opiate Screen, Urine: NEGATIVE

## 2013-03-19 LAB — ABO AND RH: Rh Type: POSITIVE

## 2013-03-20 LAB — CYSTIC FIBROSIS DIAGNOSTIC STUDY

## 2013-03-26 ENCOUNTER — Encounter: Payer: Self-pay | Admitting: Obstetrics & Gynecology

## 2013-03-26 ENCOUNTER — Ambulatory Visit (INDEPENDENT_AMBULATORY_CARE_PROVIDER_SITE_OTHER): Payer: Medicaid Other | Admitting: Obstetrics & Gynecology

## 2013-03-26 ENCOUNTER — Ambulatory Visit (INDEPENDENT_AMBULATORY_CARE_PROVIDER_SITE_OTHER): Payer: Medicaid Other

## 2013-03-26 ENCOUNTER — Other Ambulatory Visit: Payer: Self-pay | Admitting: Obstetrics & Gynecology

## 2013-03-26 VITALS — BP 100/60 | Wt 155.0 lb

## 2013-03-26 DIAGNOSIS — B009 Herpesviral infection, unspecified: Secondary | ICD-10-CM

## 2013-03-26 DIAGNOSIS — Z1389 Encounter for screening for other disorder: Secondary | ICD-10-CM

## 2013-03-26 DIAGNOSIS — Z3481 Encounter for supervision of other normal pregnancy, first trimester: Secondary | ICD-10-CM

## 2013-03-26 DIAGNOSIS — O09299 Supervision of pregnancy with other poor reproductive or obstetric history, unspecified trimester: Secondary | ICD-10-CM

## 2013-03-26 DIAGNOSIS — O0991 Supervision of high risk pregnancy, unspecified, first trimester: Secondary | ICD-10-CM

## 2013-03-26 DIAGNOSIS — O26849 Uterine size-date discrepancy, unspecified trimester: Secondary | ICD-10-CM

## 2013-03-26 DIAGNOSIS — O10019 Pre-existing essential hypertension complicating pregnancy, unspecified trimester: Secondary | ICD-10-CM

## 2013-03-26 DIAGNOSIS — O98519 Other viral diseases complicating pregnancy, unspecified trimester: Secondary | ICD-10-CM

## 2013-03-26 DIAGNOSIS — O10911 Unspecified pre-existing hypertension complicating pregnancy, first trimester: Secondary | ICD-10-CM

## 2013-03-26 DIAGNOSIS — Z331 Pregnant state, incidental: Secondary | ICD-10-CM

## 2013-03-26 DIAGNOSIS — O09219 Supervision of pregnancy with history of pre-term labor, unspecified trimester: Secondary | ICD-10-CM

## 2013-03-26 DIAGNOSIS — F1920 Other psychoactive substance dependence, uncomplicated: Secondary | ICD-10-CM

## 2013-03-26 LAB — POCT URINALYSIS DIPSTICK
Blood, UA: NEGATIVE
Nitrite, UA: NEGATIVE

## 2013-03-26 LAB — US OB COMP LESS 14 WKS

## 2013-03-26 NOTE — Progress Notes (Signed)
100/60

## 2013-03-26 NOTE — Progress Notes (Signed)
Sonogram reviewed, dates were a little off.  EDD 12.4.2014 Routine OB appt 4 weeks

## 2013-03-26 NOTE — Patient Instructions (Signed)
Pregnancy - First Trimester During sexual intercourse, millions of sperm go into the vagina. Only 1 sperm will penetrate and fertilize the female egg while it is in the Fallopian tube. One week later, the fertilized egg implants into the wall of the uterus. An embryo begins to develop into a baby. At 6 to 8 weeks, the eyes and face are formed and the heartbeat can be seen on ultrasound. At the end of 12 weeks (first trimester), all the baby's organs are formed. Now that you are pregnant, you will want to do everything you can to have a healthy baby. Two of the most important things are to get good prenatal care and follow your caregiver's instructions. Prenatal care is all the medical care you receive before the baby's birth. It is given to prevent, find, and treat problems during the pregnancy and childbirth. PRENATAL EXAMS  During prenatal visits, your weight, blood pressure and urine are checked. This is done to make sure you are healthy and progressing normally during the pregnancy.  A pregnant woman should gain 25 to 35 pounds during the pregnancy. However, if you are over weight or underweight, your caregiver will advise you regarding your weight.  Your caregiver will ask and answer questions for you.  Blood work, cervical cultures, other necessary tests and a Pap test are done during your prenatal exams. These tests are done to check on your health and the probable health of your baby. Tests are strongly recommended and done for HIV with your permission. This is the virus that causes AIDS. These tests are done because medications can be given to help prevent your baby from being born with this infection should you have been infected without knowing it. Blood work is also used to find out your blood type, previous infections and follow your blood levels (hemoglobin).  Low hemoglobin (anemia) is common during pregnancy. Iron and vitamins are given to help prevent this. Later in the pregnancy, blood  tests for diabetes will be done along with any other tests if any problems develop. You may need tests to make sure you and the baby are doing well.  You may need other tests to make sure you and the baby are doing well. CHANGES DURING THE FIRST TRIMESTER (THE FIRST 3 MONTHS OF PREGNANCY) Your body goes through many changes during pregnancy. They vary from person to person. Talk to your caregiver about changes you notice and are concerned about. Changes can include:  Your menstrual period stops.  The egg and sperm carry the genes that determine what you look like. Genes from you and your partner are forming a baby. The female genes determine whether the baby is a boy or a girl.  Your body increases in girth and you may feel bloated.  Feeling sick to your stomach (nauseous) and throwing up (vomiting). If the vomiting is uncontrollable, call your caregiver.  Your breasts will begin to enlarge and become tender.  Your nipples may stick out more and become darker.  The need to urinate more. Painful urination may mean you have a bladder infection.  Tiring easily.  Loss of appetite.  Cravings for certain kinds of food.  At first, you may gain or lose a couple of pounds.  You may have changes in your emotions from day to day (excited to be pregnant or concerned something may go wrong with the pregnancy and baby).  You may have more vivid and strange dreams. HOME CARE INSTRUCTIONS   It is very important   to avoid all smoking, alcohol and un-prescribed drugs during your pregnancy. These affect the formation and growth of the baby. Avoid chemicals while pregnant to ensure the delivery of a healthy infant.  Start your prenatal visits by the 12th week of pregnancy. They are usually scheduled monthly at first, then more often in the last 2 months before delivery. Keep your caregiver's appointments. Follow your caregiver's instructions regarding medication use, blood and lab tests, exercise, and  diet.  During pregnancy, you are providing food for you and your baby. Eat regular, well-balanced meals. Choose foods such as meat, fish, milk and other low fat dairy products, vegetables, fruits, and whole-grain breads and cereals. Your caregiver will tell you of the ideal weight gain.  You can help morning sickness by keeping soda crackers at the bedside. Eat a couple before arising in the morning. You may want to use the crackers without salt on them.  Eating 4 to 5 small meals rather than 3 large meals a day also may help the nausea and vomiting.  Drinking liquids between meals instead of during meals also seems to help nausea and vomiting.  A physical sexual relationship may be continued throughout pregnancy if there are no other problems. Problems may be early (premature) leaking of amniotic fluid from the membranes, vaginal bleeding, or belly (abdominal) pain.  Exercise regularly if there are no restrictions. Check with your caregiver or physical therapist if you are unsure of the safety of some of your exercises. Greater weight gain will occur in the last 2 trimesters of pregnancy. Exercising will help:  Control your weight.  Keep you in shape.  Prepare you for labor and delivery.  Help you lose your pregnancy weight after you deliver your baby.  Wear a good support or jogging bra for breast tenderness during pregnancy. This may help if worn during sleep too.  Ask when prenatal classes are available. Begin classes when they are offered.  Do not use hot tubs, steam rooms or saunas.  Wear your seat belt when driving. This protects you and your baby if you are in an accident.  Avoid raw meat, uncooked cheese, cat litter boxes and soil used by cats throughout the pregnancy. These carry germs that can cause birth defects in the baby.  The first trimester is a good time to visit your dentist for your dental health. Getting your teeth cleaned is OK. Use a softer toothbrush and brush  gently during pregnancy.  Ask for help if you have financial, counseling or nutritional needs during pregnancy. Your caregiver will be able to offer counseling for these needs as well as refer you for other special needs.  Do not take any medications or herbs unless told by your caregiver.  Inform your caregiver if there is any mental or physical domestic violence.  Make a list of emergency phone numbers of family, friends, hospital, and police and fire departments.  Write down your questions. Take them to your prenatal visit.  Do not douche.  Do not cross your legs.  If you have to stand for long periods of time, rotate you feet or take small steps in a circle.  You may have more vaginal secretions that may require a sanitary pad. Do not use tampons or scented sanitary pads. MEDICATIONS AND DRUG USE IN PREGNANCY  Take prenatal vitamins as directed. The vitamin should contain 1 milligram of folic acid. Keep all vitamins out of reach of children. Only a couple vitamins or tablets containing iron may be   fatal to a baby or young child when ingested.  Avoid use of all medications, including herbs, over-the-counter medications, not prescribed or suggested by your caregiver. Only take over-the-counter or prescription medicines for pain, discomfort, or fever as directed by your caregiver. Do not use aspirin, ibuprofen, or naproxen unless directed by your caregiver.  Let your caregiver also know about herbs you may be using.  Alcohol is related to a number of birth defects. This includes fetal alcohol syndrome. All alcohol, in any form, should be avoided completely. Smoking will cause low birth rate and premature babies.  Street or illegal drugs are very harmful to the baby. They are absolutely forbidden. A baby born to an addicted mother will be addicted at birth. The baby will go through the same withdrawal an adult does.  Let your caregiver know about any medications that you have to take  and for what reason you take them. MISCARRIAGE IS COMMON DURING PREGNANCY A miscarriage does not mean you did something wrong. It is not a reason to worry about getting pregnant again. Your caregiver will help you with questions you may have. If you have a miscarriage, you may need minor surgery. SEEK MEDICAL CARE IF:  You have any concerns or worries during your pregnancy. It is better to call with your questions if you feel they cannot wait, rather than worry about them. SEEK IMMEDIATE MEDICAL CARE IF:   An unexplained oral temperature above 102 F (38.9 C) develops, or as your caregiver suggests.  You have leaking of fluid from the vagina (birth canal). If leaking membranes are suspected, take your temperature and inform your caregiver of this when you call.  There is vaginal spotting or bleeding. Notify your caregiver of the amount and how many pads are used.  You develop a bad smelling vaginal discharge with a change in the color.  You continue to feel sick to your stomach (nauseated) and have no relief from remedies suggested. You vomit blood or coffee ground-like materials.  You lose more than 2 pounds of weight in 1 week.  You gain more than 2 pounds of weight in 1 week and you notice swelling of your face, hands, feet, or legs.  You gain 5 pounds or more in 1 week (even if you do not have swelling of your hands, face, legs, or feet).  You get exposed to German measles and have never had them.  You are exposed to fifth disease or chickenpox.  You develop belly (abdominal) pain. Round ligament discomfort is a common non-cancerous (benign) cause of abdominal pain in pregnancy. Your caregiver still must evaluate this.  You develop headache, fever, diarrhea, pain with urination, or shortness of breath.  You fall or are in a car accident or have any kind of trauma.  There is mental or physical violence in your home. Document Released: 11/28/2001 Document Revised: 02/26/2012  Document Reviewed: 06/01/2009 [employer] Patient Information 2013 [employer].  

## 2013-03-26 NOTE — Progress Notes (Signed)
U/S-Single IUP with + FCA noted FHR= 105BPM, CRL c/w 5+6wks, EDD-11/20/2013 bilateral adnexa wnl, cx long and closed

## 2013-04-10 ENCOUNTER — Encounter (HOSPITAL_COMMUNITY): Payer: Self-pay | Admitting: *Deleted

## 2013-04-10 ENCOUNTER — Emergency Department (HOSPITAL_COMMUNITY)
Admission: EM | Admit: 2013-04-10 | Discharge: 2013-04-10 | Disposition: A | Discharge status: Home / Self Care | Payer: Medicaid Other | Attending: Emergency Medicine | Admitting: Emergency Medicine

## 2013-04-10 DIAGNOSIS — Z3490 Encounter for supervision of normal pregnancy, unspecified, unspecified trimester: Secondary | ICD-10-CM

## 2013-04-10 DIAGNOSIS — N760 Acute vaginitis: Secondary | ICD-10-CM | POA: Insufficient documentation

## 2013-04-10 DIAGNOSIS — O219 Vomiting of pregnancy, unspecified: Secondary | ICD-10-CM | POA: Insufficient documentation

## 2013-04-10 DIAGNOSIS — O169 Unspecified maternal hypertension, unspecified trimester: Secondary | ICD-10-CM | POA: Insufficient documentation

## 2013-04-10 DIAGNOSIS — Z79899 Other long term (current) drug therapy: Secondary | ICD-10-CM | POA: Insufficient documentation

## 2013-04-10 DIAGNOSIS — B9689 Other specified bacterial agents as the cause of diseases classified elsewhere: Secondary | ICD-10-CM

## 2013-04-10 DIAGNOSIS — R102 Pelvic and perineal pain: Secondary | ICD-10-CM

## 2013-04-10 DIAGNOSIS — Z87891 Personal history of nicotine dependence: Secondary | ICD-10-CM | POA: Insufficient documentation

## 2013-04-10 DIAGNOSIS — N949 Unspecified condition associated with female genital organs and menstrual cycle: Secondary | ICD-10-CM | POA: Insufficient documentation

## 2013-04-10 DIAGNOSIS — Z8619 Personal history of other infectious and parasitic diseases: Secondary | ICD-10-CM | POA: Insufficient documentation

## 2013-04-10 DIAGNOSIS — O9989 Other specified diseases and conditions complicating pregnancy, childbirth and the puerperium: Secondary | ICD-10-CM | POA: Insufficient documentation

## 2013-04-10 DIAGNOSIS — K5900 Constipation, unspecified: Secondary | ICD-10-CM | POA: Insufficient documentation

## 2013-04-10 DIAGNOSIS — R109 Unspecified abdominal pain: Secondary | ICD-10-CM | POA: Insufficient documentation

## 2013-04-10 HISTORY — DX: Encounter for supervision of normal pregnancy, unspecified, unspecified trimester: Z34.90

## 2013-04-10 LAB — URINALYSIS, ROUTINE W REFLEX MICROSCOPIC
Bilirubin Urine: NEGATIVE
Glucose, UA: NEGATIVE mg/dL
Hgb urine dipstick: NEGATIVE
Ketones, ur: NEGATIVE mg/dL
Protein, ur: NEGATIVE mg/dL

## 2013-04-10 LAB — WET PREP, GENITAL: Yeast Wet Prep HPF POC: NONE SEEN

## 2013-04-10 NOTE — ED Notes (Addendum)
Pt states that she is [redacted] weeks pregnant, EDC is 11/20/2013.  States that she started having cramping for over 1 and 1/2 weeks, states that the cramping is worse today.  States that she has not seen Dr. Emelda Fear as of yet for this pregnancy.

## 2013-04-10 NOTE — ED Provider Notes (Signed)
History    This chart was scribed for [employer]. Rubin Payor, MD by Quintella Reichert, ED scribe.  This patient was seen in room APA07/[facility]07 and the patient's care was started at 1:44 PM.   CSN: [id number]  Arrival date & time 04/10/13  1302      Chief Complaint  Patient presents with  . Abdominal Pain     The history is provided by the patient. No language interpreter was used.   Stephanie Cook is a 27 y.o. female who is [redacted] weeks pregnant and presents to the Emergency Department complaining of intermittent lower abdominal pain that began 1.5 weeks ago but became more severe this morning.  Pt reports that episodes last 5 minutes before going away.  She describes the pain as cramps, but she states they do not feel similar to delivery cramps she has experienced with past pregnancies.  The episodes are accompanied by nausea, but not emesis.  Pt also reports constipation.  She denies diarrhea, fever, chills, vaginal discharge or bleeding, CP, cough, SOB, urinary symptoms, back pain, weakness, numbness or any other associated symptoms.   Past Medical History  Diagnosis Date  . Hypertension   . Preterm labor   . STD (female)     chlamydia, trichomonas, gonorrhea, HPV, HSV  . Pregnant     Past Surgical History  Procedure Laterality Date  . No past surgeries      History reviewed. No pertinent family history.  History  Substance Use Topics  . Smoking status: Former Smoker    Types: Cigarettes  . Smokeless tobacco: Not on file     Comment: Quit yesterday per patient  . Alcohol Use: No    OB History   Grav Para Term Preterm Abortions TAB SAB Ect Mult Living   5 3 2 1 1  0 1 0 0 3      Review of Systems  Constitutional: Negative for fever and chills.  HENT: Negative for sore throat, rhinorrhea and neck pain.   Respiratory: Negative for cough and shortness of breath.   Cardiovascular: Negative for chest pain and leg swelling.  Gastrointestinal: Positive for nausea,  abdominal pain and constipation. Negative for vomiting and diarrhea.  Genitourinary: Negative for dysuria, hematuria, vaginal bleeding, vaginal discharge and difficulty urinating.  Musculoskeletal: Negative for back pain and joint swelling.  Skin: Negative for color change and rash.  Neurological: Negative for weakness and numbness.  Psychiatric/Behavioral: Negative for agitation. The patient is not nervous/anxious.     Allergies  Motrin  Home Medications   Current Outpatient Rx  Name  Route  Sig  Dispense  Refill  . methyldopa (ALDOMET) 250 MG tablet   Oral   Take 1 tablet (250 mg total) by mouth 2 (two) times daily.   60 tablet   3   . Pediatric Multiple Vit-C-FA (FLINSTONES GUMMIES OMEGA-3 DHA PO)   Oral   Take by mouth 2 (two) times daily.           BP 120/84  Pulse 76  Temp(Src) 98.7 F (37.1 C) (Oral)  Resp 20  Ht 5\' 5"  (1.651 m)  Wt 155 lb (70.308 kg)  BMI 25.79 kg/m2  SpO2 100%  Breastfeeding? No  Physical Exam  Nursing note and vitals reviewed. Constitutional: She is oriented to person, place, and time. She appears well-developed and well-nourished. No distress.  HENT:  Head: Normocephalic and atraumatic.  Eyes: EOM are normal. Pupils are equal, round, and reactive to light.  Neck: Normal range of  motion. Neck supple.  Cardiovascular: Normal rate and regular rhythm.   Pulmonary/Chest: Effort normal and breath sounds normal. No respiratory distress. She has no wheezes. She has no rales.  Abdominal: Soft. Bowel sounds are normal. There is tenderness (Suprapubic tenderness without mass).  Musculoskeletal: Normal range of motion. She exhibits no edema and no tenderness.  Neurological: She is alert and oriented to person, place, and time. She exhibits normal muscle tone. Coordination normal.  Skin: Skin is warm and dry.  Psychiatric: She has a normal mood and affect. Her behavior is normal. Thought content normal.    ED Course  Procedures (including  critical care time)  DIAGNOSTIC STUDIES: Oxygen Saturation is 100% on room air.  COORDINATION OF CARE: 1:47 PM-Discussed possibility of UTI, and explained treatment plan which includes pelvic exam and urinalysis with pt at bedside and pt agreed to plan.   2:49 PM: Performed pelvic exam:  Os is closed, thick white vaginal discharge noted, no cervical motion tenderness, no vaginal tenderness.   Labs Reviewed  WET PREP, GENITAL - Abnormal; Notable for the following:    Clue Cells Wet Prep HPF POC FEW (*)    WBC, Wet Prep HPF POC FEW (*)    All other components within normal limits  URINALYSIS, ROUTINE W REFLEX MICROSCOPIC   No results found.   1. Pelvic pain   2. Bacterial vaginosis   3. Pregnant       MDM  Patient is a weeks pregnant and has some cramping. No UTI. Pelvic exam showed possible bacterial vaginosis. After discussion with gynecology the patient will not be treated for a bacterial vaginosis. He'll follow her in the office. She has minimal other tenderness. Doubt severe findings, however appendicitis is not completely ruled out.      I personally performed the services described in this documentation, which was scribed in my presence. The recorded information has been reviewed and is accurate.    Juliet Rude. Rubin Payor, MD 04/10/13 2136

## 2013-04-10 NOTE — ED Notes (Signed)
abd "cramping " no bleeding, Nausea, no vomiting, Pt is pregnant.  8 weeks.

## 2013-04-17 ENCOUNTER — Encounter: Payer: Medicaid Other | Admitting: Obstetrics and Gynecology

## 2013-04-22 ENCOUNTER — Encounter: Payer: Self-pay | Admitting: *Deleted

## 2013-04-22 DIAGNOSIS — B009 Herpesviral infection, unspecified: Secondary | ICD-10-CM

## 2013-04-22 DIAGNOSIS — O10911 Unspecified pre-existing hypertension complicating pregnancy, first trimester: Secondary | ICD-10-CM

## 2013-04-23 ENCOUNTER — Ambulatory Visit (INDEPENDENT_AMBULATORY_CARE_PROVIDER_SITE_OTHER): Payer: Medicaid Other | Admitting: Advanced Practice Midwife

## 2013-04-23 ENCOUNTER — Encounter: Payer: Self-pay | Admitting: Advanced Practice Midwife

## 2013-04-23 VITALS — BP 112/84 | Wt 156.0 lb

## 2013-04-23 DIAGNOSIS — O10911 Unspecified pre-existing hypertension complicating pregnancy, first trimester: Secondary | ICD-10-CM

## 2013-04-23 DIAGNOSIS — Z1389 Encounter for screening for other disorder: Secondary | ICD-10-CM

## 2013-04-23 DIAGNOSIS — O10019 Pre-existing essential hypertension complicating pregnancy, unspecified trimester: Secondary | ICD-10-CM

## 2013-04-23 DIAGNOSIS — Z3490 Encounter for supervision of normal pregnancy, unspecified, unspecified trimester: Secondary | ICD-10-CM

## 2013-04-23 DIAGNOSIS — Z331 Pregnant state, incidental: Secondary | ICD-10-CM

## 2013-04-23 LAB — POCT URINALYSIS DIPSTICK
Glucose, UA: NEGATIVE
Leukocytes, UA: NEGATIVE

## 2013-04-23 LAB — COMPREHENSIVE METABOLIC PANEL
ALT: <8 U/L (ref 0–35)
Alkaline Phosphatase: 43 U/L (ref 39–117)
Sodium: 135 mEq/L (ref 135–145)
Total Bilirubin: 0.8 mg/dL (ref 0.3–1.2)
Total Protein: 6.9 g/dL (ref 6.0–8.3)

## 2013-04-23 NOTE — Progress Notes (Signed)
Looks live poison ivy rash (drying up now).  Has woods behind house.  Encouraged to wash outerwear/clothes, etc. And get covered up when walking through the woods.  Routine questions about pregnancy answered. Baseline 24 hour urine/CMP ordered.   F/U in 3 weeks for NT/IT.

## 2013-04-23 NOTE — Progress Notes (Signed)
White, mucous discharge this am. No odor.

## 2013-05-01 ENCOUNTER — Other Ambulatory Visit: Payer: Self-pay

## 2013-05-01 DIAGNOSIS — O10011 Pre-existing essential hypertension complicating pregnancy, first trimester: Secondary | ICD-10-CM

## 2013-05-01 NOTE — Addendum Note (Signed)
Addended by: Richardson Chiquito on: 05/01/2013 04:49 PM   Modules accepted: Orders

## 2013-05-14 ENCOUNTER — Other Ambulatory Visit: Payer: Self-pay | Admitting: Obstetrics & Gynecology

## 2013-05-14 ENCOUNTER — Ambulatory Visit (INDEPENDENT_AMBULATORY_CARE_PROVIDER_SITE_OTHER): Payer: Medicaid Other

## 2013-05-14 ENCOUNTER — Encounter: Payer: Self-pay | Admitting: Obstetrics & Gynecology

## 2013-05-14 ENCOUNTER — Ambulatory Visit (INDEPENDENT_AMBULATORY_CARE_PROVIDER_SITE_OTHER): Payer: Medicaid Other | Admitting: Obstetrics & Gynecology

## 2013-05-14 VITALS — BP 110/80 | Wt 156.0 lb

## 2013-05-14 DIAGNOSIS — Z331 Pregnant state, incidental: Secondary | ICD-10-CM

## 2013-05-14 DIAGNOSIS — Z3490 Encounter for supervision of normal pregnancy, unspecified, unspecified trimester: Secondary | ICD-10-CM

## 2013-05-14 DIAGNOSIS — Z3482 Encounter for supervision of other normal pregnancy, second trimester: Secondary | ICD-10-CM

## 2013-05-14 DIAGNOSIS — O10019 Pre-existing essential hypertension complicating pregnancy, unspecified trimester: Secondary | ICD-10-CM

## 2013-05-14 DIAGNOSIS — Z36 Encounter for antenatal screening of mother: Secondary | ICD-10-CM

## 2013-05-14 DIAGNOSIS — Z1389 Encounter for screening for other disorder: Secondary | ICD-10-CM

## 2013-05-14 LAB — POCT URINALYSIS DIPSTICK
Blood, UA: NEGATIVE
Glucose, UA: NEGATIVE
Ketones, UA: NEGATIVE

## 2013-05-14 NOTE — Progress Notes (Signed)
U/S(12+6wks)-single IUP with +FCA noted, CRL c/w dates, cx long and closed, bilateral adnexa wnl, NB present , NT=1.29mm

## 2013-05-14 NOTE — Progress Notes (Signed)
No bleeding no complaints nausea better Sonogram noted and normal

## 2013-06-11 ENCOUNTER — Other Ambulatory Visit: Payer: Self-pay | Admitting: Obstetrics & Gynecology

## 2013-06-11 ENCOUNTER — Encounter: Payer: Self-pay | Admitting: Obstetrics & Gynecology

## 2013-06-11 ENCOUNTER — Ambulatory Visit (INDEPENDENT_AMBULATORY_CARE_PROVIDER_SITE_OTHER): Payer: Medicaid Other | Admitting: Obstetrics & Gynecology

## 2013-06-11 VITALS — BP 120/80 | Wt 158.0 lb

## 2013-06-11 DIAGNOSIS — Z1389 Encounter for screening for other disorder: Secondary | ICD-10-CM

## 2013-06-11 DIAGNOSIS — O10019 Pre-existing essential hypertension complicating pregnancy, unspecified trimester: Secondary | ICD-10-CM

## 2013-06-11 DIAGNOSIS — O10912 Unspecified pre-existing hypertension complicating pregnancy, second trimester: Secondary | ICD-10-CM

## 2013-06-11 DIAGNOSIS — O0992 Supervision of high risk pregnancy, unspecified, second trimester: Secondary | ICD-10-CM

## 2013-06-11 DIAGNOSIS — O10012 Pre-existing essential hypertension complicating pregnancy, second trimester: Secondary | ICD-10-CM

## 2013-06-11 DIAGNOSIS — Z331 Pregnant state, incidental: Secondary | ICD-10-CM

## 2013-06-11 LAB — POCT URINALYSIS DIPSTICK
Ketones, UA: NEGATIVE
Leukocytes, UA: NEGATIVE

## 2013-06-11 NOTE — Progress Notes (Signed)
2nd IT today.  Detailed sonogram in 3 weeks BP weight and urine results all reviewed and noted. Patient reports good fetal movement, denies any bleeding and no rupture of membranes symptoms or regular contractions. Patient is without complaints. All questions were answered.

## 2013-06-11 NOTE — Progress Notes (Signed)
2nd IT today. 

## 2013-06-11 NOTE — Patient Instructions (Addendum)
Pregnancy - Second Trimester The second trimester of pregnancy (3 to 6 months) is a period of rapid growth for you and your baby. At the end of the sixth month, your baby is about 9 inches long and weighs 1 1/2 pounds. You will begin to feel the baby move between 18 and 20 weeks of the pregnancy. This is called quickening. Weight gain is faster. A clear fluid (colostrum) may leak out of your breasts. You may feel small contractions of the womb (uterus). This is known as false labor or Braxton-Hicks contractions. This is like a practice for labor when the baby is ready to be born. Usually, the problems with morning sickness have usually passed by the end of your first trimester. Some women develop small dark blotches (called cholasma, mask of pregnancy) on their face that usually goes away after the baby is born. Exposure to the sun makes the blotches worse. Acne may also develop in some pregnant women and pregnant women who have acne, may find that it goes away. PRENATAL EXAMS  Blood work may continue to be done during prenatal exams. These tests are done to check on your health and the probable health of your baby. Blood work is used to follow your blood levels (hemoglobin). Anemia (low hemoglobin) is common during pregnancy. Iron and vitamins are given to help prevent this. You will also be checked for diabetes between 24 and 28 weeks of the pregnancy. Some of the previous blood tests may be repeated.  The size of the uterus is measured during each visit. This is to make sure that the baby is continuing to grow properly according to the dates of the pregnancy.  Your blood pressure is checked every prenatal visit. This is to make sure you are not getting toxemia.  Your urine is checked to make sure you do not have an infection, diabetes or protein in the urine.  Your weight is checked often to make sure gains are happening at the suggested rate. This is to ensure that both you and your baby are  growing normally.  Sometimes, an ultrasound is performed to confirm the proper growth and development of the baby. This is a test which bounces harmless sound waves off the baby so your caregiver can more accurately determine due dates. Sometimes, a test is done on the amniotic fluid surrounding the baby. This test is called an amniocentesis. The amniotic fluid is obtained by sticking a needle into the belly (abdomen). This is done to check the chromosomes in instances where there is a concern about possible genetic problems with the baby. It is also sometimes done near the end of pregnancy if an early delivery is required. In this case, it is done to help make sure the baby's lungs are mature enough for the baby to live outside of the womb. CHANGES OCCURING IN THE SECOND TRIMESTER OF PREGNANCY Your body goes through many changes during pregnancy. They vary from person to person. Talk to your caregiver about changes you notice that you are concerned about.  During the second trimester, you will likely have an increase in your appetite. It is normal to have cravings for certain foods. This varies from person to person and pregnancy to pregnancy.  Your lower abdomen will begin to bulge.  You may have to urinate more often because the uterus and baby are pressing on your bladder. It is also common to get more bladder infections during pregnancy. You can help this by drinking lots of fluids   and emptying your bladder before and after intercourse.  You may begin to get stretch marks on your hips, abdomen, and breasts. These are normal changes in the body during pregnancy. There are no exercises or medicines to take that prevent this change.  You may begin to develop swollen and bulging veins (varicose veins) in your legs. Wearing support hose, elevating your feet for 15 minutes, 3 to 4 times a day and limiting salt in your diet helps lessen the problem.  Heartburn may develop as the uterus grows and  pushes up against the stomach. Antacids recommended by your caregiver helps with this problem. Also, eating smaller meals 4 to 5 times a day helps.  Constipation can be treated with a stool softener or adding bulk to your diet. Drinking lots of fluids, and eating vegetables, fruits, and whole grains are helpful.  Exercising is also helpful. If you have been very active up until your pregnancy, most of these activities can be continued during your pregnancy. If you have been less active, it is helpful to start an exercise program such as walking.  Hemorrhoids may develop at the end of the second trimester. Warm sitz baths and hemorrhoid cream recommended by your caregiver helps hemorrhoid problems.  Backaches may develop during this time of your pregnancy. Avoid heavy lifting, wear low heal shoes, and practice good posture to help with backache problems.  Some pregnant women develop tingling and numbness of their hand and fingers because of swelling and tightening of ligaments in the wrist (carpel tunnel syndrome). This goes away after the baby is born.  As your breasts enlarge, you may have to get a bigger bra. Get a comfortable, cotton, support bra. Do not get a nursing bra until the last month of the pregnancy if you will be nursing the baby.  You may get a dark line from your belly button to the pubic area called the linea nigra.  You may develop rosy cheeks because of increase blood flow to the face.  You may develop spider looking lines of the face, neck, arms, and chest. These go away after the baby is born. HOME CARE INSTRUCTIONS   It is extremely important to avoid all smoking, herbs, alcohol, and unprescribed drugs during your pregnancy. These chemicals affect the formation and growth of the baby. Avoid these chemicals throughout the pregnancy to ensure the delivery of a healthy infant.  Most of your home care instructions are the same as suggested for the first trimester of your  pregnancy. Keep your caregiver's appointments. Follow your caregiver's instructions regarding medicine use, exercise, and diet.  During pregnancy, you are providing food for you and your baby. Continue to eat regular, well-balanced meals. Choose foods such as meat, fish, milk and other low fat dairy products, vegetables, fruits, and whole-grain breads and cereals. Your caregiver will tell you of the ideal weight gain.  A physical sexual relationship may be continued up until near the end of pregnancy if there are no other problems. Problems could include early (premature) leaking of amniotic fluid from the membranes, vaginal bleeding, abdominal pain, or other medical or pregnancy problems.  Exercise regularly if there are no restrictions. Check with your caregiver if you are unsure of the safety of some of your exercises. The greatest weight gain will occur in the last 2 trimesters of pregnancy. Exercise will help you:  Control your weight.  Get you in shape for labor and delivery.  Lose weight after you have the baby.  Wear   a good support or jogging bra for breast tenderness during pregnancy. This may help if worn during sleep. Pads or tissues may be used in the bra if you are leaking colostrum.  Do not use hot tubs, steam rooms or saunas throughout the pregnancy.  Wear your seat belt at all times when driving. This protects you and your baby if you are in an accident.  Avoid raw meat, uncooked cheese, cat litter boxes, and soil used by cats. These carry germs that can cause birth defects in the baby.  The second trimester is also a good time to visit your dentist for your dental health if this has not been done yet. Getting your teeth cleaned is okay. Use a soft toothbrush. Brush gently during pregnancy.  It is easier to leak urine during pregnancy. Tightening up and strengthening the pelvic muscles will help with this problem. Practice stopping your urination while you are going to the  bathroom. These are the same muscles you need to strengthen. It is also the muscles you would use as if you were trying to stop from passing gas. You can practice tightening these muscles up 10 times a set and repeating this about 3 times per day. Once you know what muscles to tighten up, do not perform these exercises during urination. It is more likely to contribute to an infection by backing up the urine.  Ask for help if you have financial, counseling, or nutritional needs during pregnancy. Your caregiver will be able to offer counseling for these needs as well as refer you for other special needs.  Your skin may become oily. If so, wash your face with mild soap, use non-greasy moisturizer and oil or cream based makeup. MEDICINES AND DRUG USE IN PREGNANCY  Take prenatal vitamins as directed. The vitamin should contain 1 milligram of folic acid. Keep all vitamins out of reach of children. Only a couple vitamins or tablets containing iron may be fatal to a baby or young child when ingested.  Avoid use of all medicines, including herbs, over-the-counter medicines, not prescribed or suggested by your caregiver. Only take over-the-counter or prescription medicines for pain, discomfort, or fever as directed by your caregiver. Do not use aspirin.  Let your caregiver also know about herbs you may be using.  Alcohol is related to a number of birth defects. This includes fetal alcohol syndrome. All alcohol, in any form, should be avoided completely. Smoking will cause low birth rate and premature babies.  Street or illegal drugs are very harmful to the baby. They are absolutely forbidden. A baby born to an addicted mother will be addicted at birth. The baby will go through the same withdrawal an adult does. SEEK MEDICAL CARE IF:  You have any concerns or worries during your pregnancy. It is better to call with your questions if you feel they cannot wait, rather than worry about them. SEEK IMMEDIATE  MEDICAL CARE IF:   An unexplained oral temperature above 102 F (38.9 C) develops, or as your caregiver suggests.  You have leaking of fluid from the vagina (birth canal). If leaking membranes are suspected, take your temperature and tell your caregiver of this when you call.  There is vaginal spotting, bleeding, or passing clots. Tell your caregiver of the amount and how many pads are used. Light spotting in pregnancy is common, especially following intercourse.  You develop a bad smelling vaginal discharge with a change in the color from clear to white.  You continue to feel   sick to your stomach (nauseated) and have no relief from remedies suggested. You vomit blood or coffee ground-like materials.  You lose more than 2 pounds of weight or gain more than 2 pounds of weight over 1 week, or as suggested by your caregiver.  You notice swelling of your face, hands, feet, or legs.  You get exposed to German measles and have never had them.  You are exposed to fifth disease or chickenpox.  You develop belly (abdominal) pain. Round ligament discomfort is a common non-cancerous (benign) cause of abdominal pain in pregnancy. Your caregiver still must evaluate you.  You develop a bad headache that does not go away.  You develop fever, diarrhea, pain with urination, or shortness of breath.  You develop visual problems, blurry, or double vision.  You fall or are in a car accident or any kind of trauma.  There is mental or physical violence at home. Document Released: 11/28/2001 Document Revised: 08/28/2012 Document Reviewed: 06/02/2009 [employer] Patient Information 2014 [employer].  

## 2013-06-15 LAB — MATERNAL SCREEN, INTEGRATED #2
AFP MoM: 0.86
Age risk Down Syndrome: 1:1000 {titer}
Crown Rump Length: 69.8 mm
Estriol Mom: 1.28
Estriol, Free: 0.85 ng/mL
MSS Down Syndrome: <1:5000 {titer}
Nuchal Translucency: 1.49 mm
Number of fetuses: 1
PAPP-A: 2523 ng/mL

## 2013-06-20 ENCOUNTER — Encounter (HOSPITAL_COMMUNITY): Payer: Self-pay | Admitting: *Deleted

## 2013-06-20 ENCOUNTER — Emergency Department (HOSPITAL_COMMUNITY)
Admission: EM | Admit: 2013-06-20 | Discharge: 2013-06-20 | Disposition: A | Discharge status: Home / Self Care | Payer: Medicaid Other | Attending: Emergency Medicine | Admitting: Emergency Medicine

## 2013-06-20 DIAGNOSIS — I10 Essential (primary) hypertension: Secondary | ICD-10-CM | POA: Insufficient documentation

## 2013-06-20 DIAGNOSIS — Z87891 Personal history of nicotine dependence: Secondary | ICD-10-CM | POA: Insufficient documentation

## 2013-06-20 DIAGNOSIS — Z8751 Personal history of pre-term labor: Secondary | ICD-10-CM | POA: Insufficient documentation

## 2013-06-20 DIAGNOSIS — Z79899 Other long term (current) drug therapy: Secondary | ICD-10-CM | POA: Insufficient documentation

## 2013-06-20 DIAGNOSIS — O9989 Other specified diseases and conditions complicating pregnancy, childbirth and the puerperium: Secondary | ICD-10-CM | POA: Insufficient documentation

## 2013-06-20 DIAGNOSIS — Z8619 Personal history of other infectious and parasitic diseases: Secondary | ICD-10-CM | POA: Insufficient documentation

## 2013-06-20 DIAGNOSIS — R109 Unspecified abdominal pain: Secondary | ICD-10-CM | POA: Insufficient documentation

## 2013-06-20 LAB — URINALYSIS, ROUTINE W REFLEX MICROSCOPIC
Bilirubin Urine: NEGATIVE
Glucose, UA: NEGATIVE mg/dL
pH: 7 (ref 5.0–8.0)

## 2013-06-20 LAB — WET PREP, GENITAL

## 2013-06-20 MED ORDER — OXYCODONE-ACETAMINOPHEN 5-325 MG PO TABS
1.0000 | ORAL_TABLET | Freq: Once | ORAL | Status: AC
  Administered 2013-06-20: 1 via ORAL
  Filled 2013-06-20: qty 1

## 2013-06-20 MED ORDER — HYDROCODONE-ACETAMINOPHEN 5-325 MG PO TABS: 1.0000 | ORAL_TABLET | ORAL | Status: DC | PRN

## 2013-06-20 NOTE — ED Notes (Signed)
L pelvic cramping in [redacted] week pregnant patient w/history of 1 miscarriage w/in first trimester and 1 premature delivery.  Two other normal deliveries.  No spotting, n/v.

## 2013-06-20 NOTE — ED Provider Notes (Signed)
History  This chart was scribed for Lyanne Co, MD by Bennett Scrape, ED Scribe. This patient was seen in room [facility] and the patient's care was started at 12:20 PM.  CSN: [id number]  Arrival date & time 06/20/13  1216   First MD Initiated Contact with Patient 06/20/13 1220     Chief Complaint  Patient presents with  . Abdominal Cramping    The history is provided by the patient. No language interpreter was used.    HPI Comments: Stephanie Cook is a 27 y.o. female who is [redacted] weeks pregnant presents to the Emergency Department complaining of intermittent suprapubic abdominal pain described as cramping that radiates bilaterally to the lower back that started this morning. She reports that her due date is Dec 4th, 2014. She is G5P3A1 and reports one child was born at 60 weeks and another she miscarried within the first trimester. She denies any complications with this pregnancy until today. She admits that she still feels fetal movements and has had a normal [place] with her OB-GYN since finding out she was pregnant. She denies vaginal bleeding or loss of fluid, nausea, emesis, diarrhea, dsyuria, frequency and urgency as associated symptoms.  OB-GYN is Dr. Emelda Fear.  Past Medical History  Diagnosis Date  . Hypertension   . Preterm labor   . STD (female)     chlamydia, trichomonas, gonorrhea, HPV, HSV  . Pregnant   . Hx of trichomoniasis     recurrent, 4 times  . Hx of chlamydia infection     multiple times  . Hx of gonorrhea   . HSV-2 (herpes simplex virus 2) infection    Past Surgical History  Procedure Laterality Date  . No past surgeries     Family History  Problem Relation Age of Onset  . Mental illness Other   . Hypertension Other    History  Substance Use Topics  . Smoking status: Former Smoker    Types: Cigarettes  . Smokeless tobacco: Never Used     Comment: Quit yesterday per patient  . Alcohol Use: No   OB History   Grav Para Term Preterm  Abortions TAB SAB Ect Mult Living   5 3 2 1 1  0 1 0 0 3     Review of Systems  A complete 10 system review of systems was obtained and all systems are negative except as noted in the HPI and PMH.   Allergies  Motrin  Home Medications   Current Outpatient Rx  Name  Route  Sig  Dispense  Refill  . methyldopa (ALDOMET) 250 MG tablet   Oral   Take 1 tablet (250 mg total) by mouth 2 (two) times daily.   60 tablet   3   . Pediatric Multiple Vit-C-FA (FLINSTONES GUMMIES OMEGA-3 DHA PO)   Oral   Take by mouth 2 (two) times daily.          Triage Vitals: BP 119/78  Pulse 93  Temp(Src) 99.2 F (37.3 C) (Oral)  Resp 20  Ht 5\' 2"  (1.575 m)  Wt 158 lb (71.668 kg)  BMI 28.89 kg/m2  SpO2 99%  LMP 01/22/2013  Physical Exam  Nursing note and vitals reviewed. Constitutional: She is oriented to person, place, and time. She appears well-developed and well-nourished. No distress.  HENT:  Head: Normocephalic and atraumatic.  Eyes: EOM are normal.  Neck: Normal range of motion.  Cardiovascular: Normal rate, regular rhythm and normal heart sounds.   Pulmonary/Chest: Effort normal and  breath sounds normal.  Abdominal: Soft. She exhibits no distension. There is tenderness (mild suprapubic tenderness).  Genitourinary:  Normal external genitalia.  Normal-appearing cervix.  Cervical os is closed.  No vaginal bleeding noted.  No vaginal discharge.  no vaginal odor.  Musculoskeletal: Normal range of motion.  Neurological: She is alert and oriented to person, place, and time.  Skin: Skin is warm and dry.  Psychiatric: She has a normal mood and affect. Judgment normal.    ED Course  Procedures (including critical care time)  Medications  oxyCODONE-acetaminophen (PERCOCET/ROXICET) 5-325 MG per tablet 1 tablet (1 tablet Oral Given 06/20/13 1240)    DIAGNOSTIC STUDIES: Oxygen Saturation is 99% on room air, normal by my interpretation.    COORDINATION OF CARE: 12:35 PM-Discussed  treatment plan which includes pain medication and UA with pt at bedside and pt agreed to plan.   Labs Reviewed  URINALYSIS, ROUTINE W REFLEX MICROSCOPIC - Abnormal; Notable for the following:    Ketones, ur TRACE (*)    All other components within normal limits  WET PREP, GENITAL  GC/CHLAMYDIA PROBE AMP   No results found.  1. Abdominal cramping     MDM  Patient feels much better after one Percocet.  Abdominal exam demonstrates no focal tenderness.  No indication for imaging.  The majority of her cramping is lower abdominal in nature.  Fetal heart rate 147.  This may represent threatened miscarriage.  Miscarriage precautions given.  She'll need OB/GYN followup on Monday.  Have encouraged to return to the ER for new or worsening symptoms.  Doubt appendicitis   I personally performed the services described in this documentation, which was scribed in my presence. The recorded information has been reviewed and is accurate.      Lyanne Co, MD 06/20/13 [id number]

## 2013-06-20 NOTE — Discharge Instructions (Signed)
Abdominal Pain During Pregnancy  Abdominal discomfort is common in pregnancy. Most of the time, it does not cause harm. There are many causes of abdominal pain. Some causes are more serious than others. Some of the causes of abdominal pain in pregnancy are easily diagnosed. Occasionally, the diagnosis takes time to understand. Other times, the cause is not determined. Abdominal pain can be a sign that something is very wrong with the pregnancy, or the pain may have nothing to do with the pregnancy at all. For this reason, always tell your caregiver if you have any abdominal discomfort.  CAUSES  Common and harmless causes of abdominal pain include:   Constipation.   Excess gas and bloating.   Round ligament pain. This is pain that is felt in the folds of the groin.   The position the baby or placenta is in.   Baby kicks.   Braxton-Hicks contractions. These are mild contractions that do not cause cervical dilation.  Serious causes of abdominal pain include:   Ectopic pregnancy. This happens when a fertilized egg implants outside of the uterus.   Miscarriage.   Preterm labor. This is when labor starts at less than 37 weeks of pregnancy.   Placental abruption. This is when the placenta partially or completely separates from the uterus.   Preeclampsia. This is often associated with high blood pressure and has been referred to as "toxemia in pregnancy."   Uterine or amniotic fluid infections.  Causes unrelated to pregnancy include:   Urinary tract infection.   Gallbladder stones or inflammation.   Hepatitis or other liver illness.   Intestinal problems, stomach flu, food poisoning, or ulcer.   Appendicitis.   Kidney (renal) stones.   Kidney infection (pylonephritis).  HOME CARE INSTRUCTIONS   For mild pain:   Do not have sexual intercourse or put anything in your vagina until your symptoms go away completely.   Get plenty of rest until your pain improves. If your pain does not improve in 1 hour, call  your caregiver.   Drink clear fluids if you feel nauseous. Avoid solid food as long as you are uncomfortable or nauseous.   Only take medicine as directed by your caregiver.   Keep all follow-up appointments with your caregiver.  SEEK IMMEDIATE MEDICAL CARE IF:   You are bleeding, leaking fluid, or passing tissue from the vagina.   You have increasing pain or cramping.   You have persistent vomiting.   You have painful or bloody urination.   You have a fever.   You notice a decrease in your baby's movements.   You have extreme weakness or feel faint.   You have shortness of breath, with or without abdominal pain.   You develop a severe headache with abdominal pain.   You have abnormal vaginal discharge with abdominal pain.   You have persistent diarrhea.   You have abdominal pain that continues even after rest, or gets worse.  MAKE SURE YOU:    Understand these instructions.   Will watch your condition.   Will get help right away if you are not doing well or get worse.  Document Released: 12/04/2005 Document Revised: 02/26/2012 Document Reviewed: 06/30/2011  [employer] Patient Information 2014 [employer].

## 2013-06-22 LAB — GC/CHLAMYDIA PROBE AMP
CT Probe RNA: NEGATIVE
GC Probe RNA: NEGATIVE

## 2013-06-23 ENCOUNTER — Ambulatory Visit (INDEPENDENT_AMBULATORY_CARE_PROVIDER_SITE_OTHER): Payer: Medicaid Other | Admitting: Obstetrics and Gynecology

## 2013-06-23 VITALS — BP 112/80 | Wt 159.2 lb

## 2013-06-23 DIAGNOSIS — O09299 Supervision of pregnancy with other poor reproductive or obstetric history, unspecified trimester: Secondary | ICD-10-CM

## 2013-06-23 DIAGNOSIS — Z1389 Encounter for screening for other disorder: Secondary | ICD-10-CM

## 2013-06-23 DIAGNOSIS — O09219 Supervision of pregnancy with history of pre-term labor, unspecified trimester: Secondary | ICD-10-CM

## 2013-06-23 DIAGNOSIS — O10019 Pre-existing essential hypertension complicating pregnancy, unspecified trimester: Secondary | ICD-10-CM

## 2013-06-23 DIAGNOSIS — Z331 Pregnant state, incidental: Secondary | ICD-10-CM

## 2013-06-23 DIAGNOSIS — O98519 Other viral diseases complicating pregnancy, unspecified trimester: Secondary | ICD-10-CM

## 2013-06-23 LAB — POCT URINALYSIS DIPSTICK: Nitrite, UA: NEGATIVE

## 2013-06-23 NOTE — Progress Notes (Signed)
Pt went to [facility] on June 20, 2013 for abdominal pain, told to follow up here today. No vaginal bleeding.

## 2013-06-23 NOTE — Progress Notes (Signed)
Classes encouraged. BC implanon

## 2013-06-26 ENCOUNTER — Encounter (HOSPITAL_COMMUNITY): Payer: Self-pay | Admitting: Emergency Medicine

## 2013-06-26 DIAGNOSIS — Z79899 Other long term (current) drug therapy: Secondary | ICD-10-CM | POA: Insufficient documentation

## 2013-06-26 DIAGNOSIS — O209 Hemorrhage in early pregnancy, unspecified: Secondary | ICD-10-CM | POA: Insufficient documentation

## 2013-06-26 DIAGNOSIS — Z8742 Personal history of other diseases of the female genital tract: Secondary | ICD-10-CM | POA: Insufficient documentation

## 2013-06-26 DIAGNOSIS — Z87891 Personal history of nicotine dependence: Secondary | ICD-10-CM | POA: Insufficient documentation

## 2013-06-26 DIAGNOSIS — Z8619 Personal history of other infectious and parasitic diseases: Secondary | ICD-10-CM | POA: Insufficient documentation

## 2013-06-26 DIAGNOSIS — N760 Acute vaginitis: Secondary | ICD-10-CM | POA: Insufficient documentation

## 2013-06-26 DIAGNOSIS — O98919 Unspecified maternal infectious and parasitic disease complicating pregnancy, unspecified trimester: Secondary | ICD-10-CM | POA: Insufficient documentation

## 2013-06-26 DIAGNOSIS — I10 Essential (primary) hypertension: Secondary | ICD-10-CM | POA: Insufficient documentation

## 2013-06-26 NOTE — ED Notes (Signed)
Patient states she went to bathroom about 15 minutes and noticed blood on toilet paper.  Patient is [redacted] weeks pregnant.

## 2013-06-27 ENCOUNTER — Emergency Department (HOSPITAL_COMMUNITY)
Admission: EM | Admit: 2013-06-27 | Discharge: 2013-06-27 | Disposition: A | Discharge status: Home / Self Care | Payer: Medicaid Other | Attending: Emergency Medicine | Admitting: Emergency Medicine

## 2013-06-27 DIAGNOSIS — B9689 Other specified bacterial agents as the cause of diseases classified elsewhere: Secondary | ICD-10-CM

## 2013-06-27 DIAGNOSIS — O209 Hemorrhage in early pregnancy, unspecified: Secondary | ICD-10-CM

## 2013-06-27 LAB — URINALYSIS, ROUTINE W REFLEX MICROSCOPIC
Bilirubin Urine: NEGATIVE
Ketones, ur: NEGATIVE mg/dL
Nitrite: NEGATIVE
Urobilinogen, UA: 1 mg/dL (ref 0.0–1.0)
pH: 6.5 (ref 5.0–8.0)

## 2013-06-27 LAB — TYPE AND SCREEN: ABO/RH(D): B POS

## 2013-06-27 LAB — WET PREP, GENITAL
Trich, Wet Prep: NONE SEEN
Yeast Wet Prep HPF POC: NONE SEEN

## 2013-06-27 MED ORDER — METRONIDAZOLE 500 MG PO TABS: 500.0000 mg | ORAL_TABLET | Freq: Two times a day (BID) | ORAL | Status: DC

## 2013-06-27 NOTE — ED Notes (Signed)
Discharge instructions given and reviewed with patient.  Prescription given for Flagyl; effect and use explained.  Patient verbalized understanding to complete all antibiotic and follow up with OB.  Patient ambulatory; discharged home in good condition.

## 2013-06-27 NOTE — ED Provider Notes (Signed)
History    CSN: [id number] Arrival date & time 06/26/13  2241  First MD Initiated Contact with Patient 06/27/13 0008     Chief Complaint  Patient presents with  . Vaginal Bleeding    HPI Stephanie Cook is a 27 y.o. female presenting at 15 and [redacted] weeks pregnant, she is G5P3A1 w/ one child was born at 45 weeks and had a miscarriage within the first trimester, presenting with some blood on the toilet paper after wiping. Patient is had no rush of fluid, no vaginal bleeding that she knows of, she has increased frequency, denies dysuria, no nausea vomiting, she does note some abdominal contractions.  No chest pain, shortness of breath. Denies any vaginal itching or other vaginal discharge.   Past Medical History  Diagnosis Date  . Hypertension   . Preterm labor   . STD (female)     chlamydia, trichomonas, gonorrhea, HPV, HSV  . Pregnant   . Hx of trichomoniasis     recurrent, 4 times  . Hx of chlamydia infection     multiple times  . Hx of gonorrhea   . HSV-2 (herpes simplex virus 2) infection    Past Surgical History  Procedure Laterality Date  . No past surgeries     Family History  Problem Relation Age of Onset  . Mental illness Other   . Hypertension Other    History  Substance Use Topics  . Smoking status: Former Smoker    Types: Cigarettes  . Smokeless tobacco: Never Used     Comment: Quit yesterday per patient  . Alcohol Use: No   OB History   Grav Para Term Preterm Abortions TAB SAB Ect Mult Living   5 3 2 1 1  0 1 0 0 3     Review of Systems At least 10pt or greater review of systems completed and are negative except where specified in the HPI.  Allergies  Motrin  Home Medications   Current Outpatient Rx  Name  Route  Sig  Dispense  Refill  . HYDROcodone-acetaminophen (NORCO/VICODIN) 5-325 MG per tablet   Oral   Take 1 tablet by mouth every 4 (four) hours as needed for pain.   15 tablet   0   . methyldopa (ALDOMET) 250 MG tablet   Oral  Take 1 tablet (250 mg total) by mouth 2 (two) times daily.   60 tablet   3   . Pediatric Multiple Vit-C-FA (FLINSTONES GUMMIES OMEGA-3 DHA PO)   Oral   Take by mouth 2 (two) times daily.          BP 122/75  Pulse 88  Temp(Src) 98.3 F (36.8 C) (Oral)  Resp 20  Ht 5\' 2"  (1.575 m)  Wt 159 lb (72.122 kg)  BMI 29.07 kg/m2  SpO2 100%  LMP 01/22/2013 Physical Exam  Nursing notes reviewed.  Electronic medical record reviewed. VITAL SIGNS:   Filed Vitals:   06/26/13 2254  BP: 122/75  Pulse: 88  Temp: 98.3 F (36.8 C)  TempSrc: Oral  Resp: 20  Height: 5\' 2"  (1.575 m)  Weight: 159 lb (72.122 kg)  SpO2: 100%   CONSTITUTIONAL: Awake, oriented, appears non-toxic HENT: Atraumatic, normocephalic, oral mucosa pink and moist, airway patent. Nares patent without drainage. External ears normal. EYES: Conjunctiva clear, EOMI, PERRLA NECK: Trachea midline, non-tender, supple CARDIOVASCULAR: Normal heart rate, Normal rhythm, No murmurs, rubs, gallops PULMONARY/CHEST: Clear to auscultation, no rhonchi, wheezes, or rales. Symmetrical breath sounds. Non-tender. ABDOMINAL: Gravid abdomen, fundus  palpated slightly to the right of midline approximately 1 cm below the umbilicus, soft, non-tender - no rebound or guarding.  BS normal. PELVIC EXAM: normal external genitalia, vulva, vagina, multiparous os, cervix appears normal-no bleeding, white vaginal secretions, uterus gravid and nontender and adnexa not palpated. NEUROLOGIC: Non-focal, moving all four extremities, no gross sensory or motor deficits. EXTREMITIES: No clubbing, cyanosis, or edema SKIN: Warm, Dry, No erythema, No rash  ED Course  Procedures (including critical care time) Labs Reviewed  HEMOGLOBIN AND HEMATOCRIT, BLOOD - Abnormal; Notable for the following:    Hemoglobin 10.8 (*)    HCT 32.0 (*)    All other components within normal limits  GLUCOSE, CAPILLARY - Abnormal; Notable for the following:    Glucose-Capillary 104 (*)     All other components within normal limits  WET PREP, GENITAL  GC/CHLAMYDIA PROBE AMP  URINALYSIS, ROUTINE W REFLEX MICROSCOPIC  TYPE AND SCREEN   No results found. No diagnosis found.  MDM  Stephanie Cook is a 27 y.o. female G5 P3 A1 presenting at 67 and 0 with small amount of vaginal bleeding. The patient's description it sounds like this may be urethral blood, on physical exam I do not see any blood in the vaginal vault, cervix is closed, no blood coming from the os, fair amount of white secretions in the vagina-I. do not think this represents a sexually transmitted infection - patient has no cervical motion tenderness, doubt PID.  GC chlamydial samples obtained. Patient does have history of prior STDs including Chlamydia Trichomonas gonorrhea HPV and HSV - no lesions seen at the vulva. No trichomoniasis seen on wet prep-moderate amount of clue cells found. No urinary tract infection evident.  Patient is pain-free, not having any symptoms in the emergency department. Patient has followup with her OB/GYN Dr. Emelda Fear on Monday, but the patient on metronidazole for presumed bacterial vaginosis and patient has appropriate followup. Patient to return to emergency department for any new or concerning symptoms. No obstetrical emergency at this time, doubt appendicitis, patient's abdomen is benign, fetal heart rate 147.  Patient be discharged home stable and good condition, she's encouraged to drink more clear fluids and avoid drinking sodas.   Jones Skene, MD 06/27/13 [phone]

## 2013-06-28 LAB — GC/CHLAMYDIA PROBE AMP: CT Probe RNA: NEGATIVE

## 2013-07-01 ENCOUNTER — Other Ambulatory Visit: Payer: Self-pay | Admitting: Obstetrics & Gynecology

## 2013-07-01 ENCOUNTER — Ambulatory Visit (INDEPENDENT_AMBULATORY_CARE_PROVIDER_SITE_OTHER): Payer: Medicaid Other | Admitting: Obstetrics & Gynecology

## 2013-07-01 ENCOUNTER — Encounter: Payer: Self-pay | Admitting: Obstetrics & Gynecology

## 2013-07-01 ENCOUNTER — Ambulatory Visit (INDEPENDENT_AMBULATORY_CARE_PROVIDER_SITE_OTHER): Payer: Medicaid Other

## 2013-07-01 VITALS — BP 120/70 | Wt 159.0 lb

## 2013-07-01 DIAGNOSIS — Z1389 Encounter for screening for other disorder: Secondary | ICD-10-CM

## 2013-07-01 DIAGNOSIS — O10019 Pre-existing essential hypertension complicating pregnancy, unspecified trimester: Secondary | ICD-10-CM

## 2013-07-01 DIAGNOSIS — O0992 Supervision of high risk pregnancy, unspecified, second trimester: Secondary | ICD-10-CM

## 2013-07-01 DIAGNOSIS — O10012 Pre-existing essential hypertension complicating pregnancy, second trimester: Secondary | ICD-10-CM

## 2013-07-01 DIAGNOSIS — Z331 Pregnant state, incidental: Secondary | ICD-10-CM

## 2013-07-01 DIAGNOSIS — O09299 Supervision of pregnancy with other poor reproductive or obstetric history, unspecified trimester: Secondary | ICD-10-CM

## 2013-07-01 DIAGNOSIS — O09899 Supervision of other high risk pregnancies, unspecified trimester: Secondary | ICD-10-CM

## 2013-07-01 LAB — POCT URINALYSIS DIPSTICK
Ketones, UA: NEGATIVE
Protein, UA: NEGATIVE

## 2013-07-01 NOTE — Progress Notes (Signed)
HAD U/S TODAY. 

## 2013-07-01 NOTE — Progress Notes (Signed)
Anat screen complete, all anat appears nml.,  meas. C/W dates, EFW=13 oz., cx closed = 3.4cm, active, ant plac., bilat ovs seen, fluid nml,

## 2013-07-01 NOTE — Patient Instructions (Signed)
Pregnancy - Second Trimester The second trimester of pregnancy (3 to 6 months) is a period of rapid growth for you and your baby. At the end of the sixth month, your baby is about 9 inches long and weighs 1 1/2 pounds. You will begin to feel the baby move between 18 and 20 weeks of the pregnancy. This is called quickening. Weight gain is faster. A clear fluid (colostrum) may leak out of your breasts. You may feel small contractions of the womb (uterus). This is known as false labor or Braxton-Hicks contractions. This is like a practice for labor when the baby is ready to be born. Usually, the problems with morning sickness have usually passed by the end of your first trimester. Some women develop small dark blotches (called cholasma, mask of pregnancy) on their face that usually goes away after the baby is born. Exposure to the sun makes the blotches worse. Acne may also develop in some pregnant women and pregnant women who have acne, may find that it goes away. PRENATAL EXAMS  Blood work may continue to be done during prenatal exams. These tests are done to check on your health and the probable health of your baby. Blood work is used to follow your blood levels (hemoglobin). Anemia (low hemoglobin) is common during pregnancy. Iron and vitamins are given to help prevent this. You will also be checked for diabetes between 24 and 28 weeks of the pregnancy. Some of the previous blood tests may be repeated.  The size of the uterus is measured during each visit. This is to make sure that the baby is continuing to grow properly according to the dates of the pregnancy.  Your blood pressure is checked every prenatal visit. This is to make sure you are not getting toxemia.  Your urine is checked to make sure you do not have an infection, diabetes or protein in the urine.  Your weight is checked often to make sure gains are happening at the suggested rate. This is to ensure that both you and your baby are  growing normally.  Sometimes, an ultrasound is performed to confirm the proper growth and development of the baby. This is a test which bounces harmless sound waves off the baby so your caregiver can more accurately determine due dates. Sometimes, a test is done on the amniotic fluid surrounding the baby. This test is called an amniocentesis. The amniotic fluid is obtained by sticking a needle into the belly (abdomen). This is done to check the chromosomes in instances where there is a concern about possible genetic problems with the baby. It is also sometimes done near the end of pregnancy if an early delivery is required. In this case, it is done to help make sure the baby's lungs are mature enough for the baby to live outside of the womb. CHANGES OCCURING IN THE SECOND TRIMESTER OF PREGNANCY Your body goes through many changes during pregnancy. They vary from person to person. Talk to your caregiver about changes you notice that you are concerned about.  During the second trimester, you will likely have an increase in your appetite. It is normal to have cravings for certain foods. This varies from person to person and pregnancy to pregnancy.  Your lower abdomen will begin to bulge.  You may have to urinate more often because the uterus and baby are pressing on your bladder. It is also common to get more bladder infections during pregnancy. You can help this by drinking lots of fluids   and emptying your bladder before and after intercourse.  You may begin to get stretch marks on your hips, abdomen, and breasts. These are normal changes in the body during pregnancy. There are no exercises or medicines to take that prevent this change.  You may begin to develop swollen and bulging veins (varicose veins) in your legs. Wearing support hose, elevating your feet for 15 minutes, 3 to 4 times a day and limiting salt in your diet helps lessen the problem.  Heartburn may develop as the uterus grows and  pushes up against the stomach. Antacids recommended by your caregiver helps with this problem. Also, eating smaller meals 4 to 5 times a day helps.  Constipation can be treated with a stool softener or adding bulk to your diet. Drinking lots of fluids, and eating vegetables, fruits, and whole grains are helpful.  Exercising is also helpful. If you have been very active up until your pregnancy, most of these activities can be continued during your pregnancy. If you have been less active, it is helpful to start an exercise program such as walking.  Hemorrhoids may develop at the end of the second trimester. Warm sitz baths and hemorrhoid cream recommended by your caregiver helps hemorrhoid problems.  Backaches may develop during this time of your pregnancy. Avoid heavy lifting, wear low heal shoes, and practice good posture to help with backache problems.  Some pregnant women develop tingling and numbness of their hand and fingers because of swelling and tightening of ligaments in the wrist (carpel tunnel syndrome). This goes away after the baby is born.  As your breasts enlarge, you may have to get a bigger bra. Get a comfortable, cotton, support bra. Do not get a nursing bra until the last month of the pregnancy if you will be nursing the baby.  You may get a dark line from your belly button to the pubic area called the linea nigra.  You may develop rosy cheeks because of increase blood flow to the face.  You may develop spider looking lines of the face, neck, arms, and chest. These go away after the baby is born. HOME CARE INSTRUCTIONS   It is extremely important to avoid all smoking, herbs, alcohol, and unprescribed drugs during your pregnancy. These chemicals affect the formation and growth of the baby. Avoid these chemicals throughout the pregnancy to ensure the delivery of a healthy infant.  Most of your home care instructions are the same as suggested for the first trimester of your  pregnancy. Keep your caregiver's appointments. Follow your caregiver's instructions regarding medicine use, exercise, and diet.  During pregnancy, you are providing food for you and your baby. Continue to eat regular, well-balanced meals. Choose foods such as meat, fish, milk and other low fat dairy products, vegetables, fruits, and whole-grain breads and cereals. Your caregiver will tell you of the ideal weight gain.  A physical sexual relationship may be continued up until near the end of pregnancy if there are no other problems. Problems could include early (premature) leaking of amniotic fluid from the membranes, vaginal bleeding, abdominal pain, or other medical or pregnancy problems.  Exercise regularly if there are no restrictions. Check with your caregiver if you are unsure of the safety of some of your exercises. The greatest weight gain will occur in the last 2 trimesters of pregnancy. Exercise will help you:  Control your weight.  Get you in shape for labor and delivery.  Lose weight after you have the baby.  Wear   a good support or jogging bra for breast tenderness during pregnancy. This may help if worn during sleep. Pads or tissues may be used in the bra if you are leaking colostrum.  Do not use hot tubs, steam rooms or saunas throughout the pregnancy.  Wear your seat belt at all times when driving. This protects you and your baby if you are in an accident.  Avoid raw meat, uncooked cheese, cat litter boxes, and soil used by cats. These carry germs that can cause birth defects in the baby.  The second trimester is also a good time to visit your dentist for your dental health if this has not been done yet. Getting your teeth cleaned is okay. Use a soft toothbrush. Brush gently during pregnancy.  It is easier to leak urine during pregnancy. Tightening up and strengthening the pelvic muscles will help with this problem. Practice stopping your urination while you are going to the  bathroom. These are the same muscles you need to strengthen. It is also the muscles you would use as if you were trying to stop from passing gas. You can practice tightening these muscles up 10 times a set and repeating this about 3 times per day. Once you know what muscles to tighten up, do not perform these exercises during urination. It is more likely to contribute to an infection by backing up the urine.  Ask for help if you have financial, counseling, or nutritional needs during pregnancy. Your caregiver will be able to offer counseling for these needs as well as refer you for other special needs.  Your skin may become oily. If so, wash your face with mild soap, use non-greasy moisturizer and oil or cream based makeup. MEDICINES AND DRUG USE IN PREGNANCY  Take prenatal vitamins as directed. The vitamin should contain 1 milligram of folic acid. Keep all vitamins out of reach of children. Only a couple vitamins or tablets containing iron may be fatal to a baby or young child when ingested.  Avoid use of all medicines, including herbs, over-the-counter medicines, not prescribed or suggested by your caregiver. Only take over-the-counter or prescription medicines for pain, discomfort, or fever as directed by your caregiver. Do not use aspirin.  Let your caregiver also know about herbs you may be using.  Alcohol is related to a number of birth defects. This includes fetal alcohol syndrome. All alcohol, in any form, should be avoided completely. Smoking will cause low birth rate and premature babies.  Street or illegal drugs are very harmful to the baby. They are absolutely forbidden. A baby born to an addicted mother will be addicted at birth. The baby will go through the same withdrawal an adult does. SEEK MEDICAL CARE IF:  You have any concerns or worries during your pregnancy. It is better to call with your questions if you feel they cannot wait, rather than worry about them. SEEK IMMEDIATE  MEDICAL CARE IF:   An unexplained oral temperature above 102 F (38.9 C) develops, or as your caregiver suggests.  You have leaking of fluid from the vagina (birth canal). If leaking membranes are suspected, take your temperature and tell your caregiver of this when you call.  There is vaginal spotting, bleeding, or passing clots. Tell your caregiver of the amount and how many pads are used. Light spotting in pregnancy is common, especially following intercourse.  You develop a bad smelling vaginal discharge with a change in the color from clear to white.  You continue to feel   sick to your stomach (nauseated) and have no relief from remedies suggested. You vomit blood or coffee ground-like materials.  You lose more than 2 pounds of weight or gain more than 2 pounds of weight over 1 week, or as suggested by your caregiver.  You notice swelling of your face, hands, feet, or legs.  You get exposed to German measles and have never had them.  You are exposed to fifth disease or chickenpox.  You develop belly (abdominal) pain. Round ligament discomfort is a common non-cancerous (benign) cause of abdominal pain in pregnancy. Your caregiver still must evaluate you.  You develop a bad headache that does not go away.  You develop fever, diarrhea, pain with urination, or shortness of breath.  You develop visual problems, blurry, or double vision.  You fall or are in a car accident or any kind of trauma.  There is mental or physical violence at home. Document Released: 11/28/2001 Document Revised: 08/28/2012 Document Reviewed: 06/02/2009 [employer] Patient Information 2014 [employer]  

## 2013-07-01 NOTE — Progress Notes (Signed)
BP weight and urine results all reviewed and noted. Patient reports good fetal movement, denies any bleeding and no rupture of membranes symptoms or regular contractions. Patient is without complaints. All questions were answered.  

## 2013-07-03 ENCOUNTER — Other Ambulatory Visit: Payer: Medicaid Other

## 2013-07-30 ENCOUNTER — Encounter: Payer: Self-pay | Admitting: Obstetrics and Gynecology

## 2013-07-30 ENCOUNTER — Ambulatory Visit (INDEPENDENT_AMBULATORY_CARE_PROVIDER_SITE_OTHER): Payer: Medicaid Other | Admitting: Obstetrics and Gynecology

## 2013-07-30 VITALS — BP 110/74 | Wt 163.0 lb

## 2013-07-30 DIAGNOSIS — Z331 Pregnant state, incidental: Secondary | ICD-10-CM

## 2013-07-30 DIAGNOSIS — O10019 Pre-existing essential hypertension complicating pregnancy, unspecified trimester: Secondary | ICD-10-CM

## 2013-07-30 DIAGNOSIS — O09219 Supervision of pregnancy with history of pre-term labor, unspecified trimester: Secondary | ICD-10-CM

## 2013-07-30 DIAGNOSIS — Z1389 Encounter for screening for other disorder: Secondary | ICD-10-CM

## 2013-07-30 DIAGNOSIS — O0992 Supervision of high risk pregnancy, unspecified, second trimester: Secondary | ICD-10-CM

## 2013-07-30 LAB — POCT URINALYSIS DIPSTICK
Glucose, UA: NEGATIVE
Leukocytes, UA: NEGATIVE

## 2013-07-30 MED ORDER — HYDROXYPROGESTERONE CAPROATE 250 MG/ML IM OIL
250.0000 mg | TOPICAL_OIL | Freq: Once | INTRAMUSCULAR | Status: AC
  Administered 2013-07-30: 250 mg via INTRAMUSCULAR

## 2013-07-30 NOTE — Patient Instructions (Addendum)
Ethinyl Estradiol; Norethindrone tablets What is this medicine? ETHINYL ESTRADIOL; NORETHINDRONE (ETH in il es tra DYE ole; nor eth IN drone) is an oral contraceptive. The products combine two types of female hormones, an estrogen and a progestin. They are used to prevent ovulation and pregnancy. This medicine may be used for other purposes; ask your health care provider or pharmacist if you have questions. What should I tell my health care provider before I take this medicine? They need to know if you have or ever had any of these conditions: -abnormal vaginal bleeding -blood vessel disease or blood clots -breast, cervical, endometrial, ovarian, liver, or uterine cancer -diabetes -gallbladder disease -heart disease or recent heart attack -high blood pressure -high cholesterol -kidney disease -liver disease -migraine headaches -stroke -systemic lupus erythematosus (SLE) -tobacco smoker -an unusual or allergic reaction to estrogens, progestins, other medicines, foods, dyes, or preservatives -pregnant or trying to get pregnant -breast-feeding How should I use this medicine? Take this medicine by mouth. To reduce nausea, this medicine may be taken with food. Follow the directions on the prescription label. Take this medicine at the same time each day and in the order directed on the package. Do not take your medicine more often than directed. A patient package insert for the product will be given with each prescription and refill. Read this sheet carefully each time. The sheet may change frequently. Contact your pediatrician regarding the use of this medicine in children. Special care may be needed. This medicine has been used in female children who have started having menstrual periods. Overdosage: If you think you have taken too much of this medicine contact a poison control center or emergency room at once. NOTE: This medicine is only for you. Do not share this medicine with others. What  if I miss a dose? If you miss a dose, refer to the patient information sheet you received with your medicine for direction. If you miss more than one pill, this medicine may not be as effective and you may need to use another form of birth control. What may interact with this medicine? -acetaminophen -antibiotics or medicines for infections, especially rifampin, rifabutin, rifapentine, and griseofulvin, and possibly penicillins or tetracyclines -aprepitant -ascorbic acid (vitamin C) -atorvastatin -barbiturate medicines, such as phenobarbital -bosentan -carbamazepine -caffeine -clofibrate -cyclosporine -dantrolene -doxercalciferol -felbamate -grapefruit juice -hydrocortisone -medicines for anxiety or sleeping problems, such as diazepam or temazepam -medicines for diabetes, including pioglitazone -mineral oil -modafinil -mycophenolate -nefazodone -oxcarbazepine -phenytoin -prednisolone -ritonavir or other medicines for HIV infection or AIDS -rosuvastatin -selegiline -soy isoflavones supplements -St. Aalayah Riles's wort -tamoxifen or raloxifene -theophylline -thyroid hormones -topiramate -warfarin This list may not describe all possible interactions. Give your health care provider a list of all the medicines, herbs, non-prescription drugs, or dietary supplements you use. Also tell them if you smoke, drink alcohol, or use illegal drugs. Some items may interact with your medicine. What should I watch for while using this medicine? Visit your doctor or health care professional for regular checks on your progress. You will need a regular breast and pelvic exam and Pap smear while on this medicine. Use an additional method of contraception during the first cycle that you take these tablets. If you have any reason to think you are pregnant, stop taking this medicine right away and contact your doctor or health care professional. If you are taking this medicine for hormone related problems,  it may take several cycles of use to see improvement in your condition. Smoking increases the risk of   getting a blood clot or having a stroke while you are taking birth control pills, especially if you are more than 25 years old. You are strongly advised not to smoke. This medicine can make your body retain fluid, making your fingers, hands, or ankles swell. Your blood pressure can go up. Contact your doctor or health care professional if you feel you are retaining fluid. This medicine can make you more sensitive to the sun. Keep out of the sun. If you cannot avoid being in the sun, wear protective clothing and use sunscreen. Do not use sun lamps or tanning beds/booths. If you wear contact lenses and notice visual changes, or if the lenses begin to feel uncomfortable, consult your eye care specialist. In some women, tenderness, swelling, or minor bleeding of the gums may occur. Notify your dentist if this happens. Brushing and flossing your teeth regularly may help limit this. See your dentist regularly and inform your dentist of the medicines you are taking. If you are going to have elective surgery, you may need to stop taking this medicine before the surgery. Consult your health care professional for advice. This medicine does not protect you against HIV infection (AIDS) or any other sexually transmitted diseases. What side effects may I notice from receiving this medicine? Side effects that you should report to your doctor or health care professional as soon as possible: -allergic reactions like skin rash, itching or hives, swelling of the face, lips, or tongue -breast tissue changes or discharge -changes in vaginal bleeding during your period or between your periods -chest pain -coughing up blood -dizziness or fainting spells -headaches or migraines -leg, arm or groin pain -problems with balance, talking, walking -severe or sudden headaches -severe stomach pain -sudden shortness of  breath -symptoms of vaginal infection like itching, irritation or unusual discharge -tenderness in the upper abdomen -vomiting -weakness or numbness in the arms or legs, especially on one side of the body -yellowing of the eyes or skin Side effects that usually do not require medical attention (report to your doctor or health care professional if they continue or are bothersome): -breakthrough bleeding and spotting that continues beyond the 3 initial cycles of pills -breast tenderness -mood changes, anxiety, depression, frustration, anger, or emotional outbursts -increased sensitivity to sun or ultraviolet light -nausea -skin rash, acne, or brown spots on the skin -slight weight gain This list may not describe all possible side effects. Call your doctor for medical advice about side effects. You may report side effects to FDA at 1-800-FDA-1088. Where should I keep my medicine? Keep out of the reach of children. Store at room temperature between 15 and 30 degrees C (59 and 86 degrees F). Throw away any unused medicine after the expiration date. NOTE: This sheet is a summary. It may not cover all possible information. If you have questions about this medicine, talk to your doctor, pharmacist, or health care provider.  2013, [employer]. (11/19/2008 1:29:07 PM)

## 2013-07-30 NOTE — Progress Notes (Signed)
Pt here today for routine visit. Pt denies any problems or concerns at this time.  

## 2013-07-30 NOTE — Progress Notes (Signed)
FOB smokes in house, strong malodor both parties.  Discussed Smoking outside house, cleaning house prior to baby's birth. Consider home visit [facility] if persists . Pt walks to appts. Need to evaluate transportation.____ 17P to be started.

## 2013-07-31 ENCOUNTER — Emergency Department (HOSPITAL_COMMUNITY): Payer: Medicaid Other

## 2013-07-31 ENCOUNTER — Encounter (HOSPITAL_COMMUNITY): Payer: Self-pay | Admitting: Emergency Medicine

## 2013-07-31 ENCOUNTER — Emergency Department (HOSPITAL_COMMUNITY)
Admission: EM | Admit: 2013-07-31 | Discharge: 2013-07-31 | Disposition: A | Discharge status: Home / Self Care | Payer: Medicaid Other | Attending: Emergency Medicine | Admitting: Emergency Medicine

## 2013-07-31 DIAGNOSIS — R296 Repeated falls: Secondary | ICD-10-CM | POA: Insufficient documentation

## 2013-07-31 DIAGNOSIS — O169 Unspecified maternal hypertension, unspecified trimester: Secondary | ICD-10-CM | POA: Insufficient documentation

## 2013-07-31 DIAGNOSIS — O9989 Other specified diseases and conditions complicating pregnancy, childbirth and the puerperium: Secondary | ICD-10-CM | POA: Insufficient documentation

## 2013-07-31 DIAGNOSIS — Z8619 Personal history of other infectious and parasitic diseases: Secondary | ICD-10-CM | POA: Insufficient documentation

## 2013-07-31 DIAGNOSIS — S93401A Sprain of unspecified ligament of right ankle, initial encounter: Secondary | ICD-10-CM

## 2013-07-31 DIAGNOSIS — S93409A Sprain of unspecified ligament of unspecified ankle, initial encounter: Secondary | ICD-10-CM | POA: Insufficient documentation

## 2013-07-31 DIAGNOSIS — Z8751 Personal history of pre-term labor: Secondary | ICD-10-CM | POA: Insufficient documentation

## 2013-07-31 DIAGNOSIS — Z87891 Personal history of nicotine dependence: Secondary | ICD-10-CM | POA: Insufficient documentation

## 2013-07-31 DIAGNOSIS — Y929 Unspecified place or not applicable: Secondary | ICD-10-CM | POA: Insufficient documentation

## 2013-07-31 DIAGNOSIS — R55 Syncope and collapse: Secondary | ICD-10-CM | POA: Insufficient documentation

## 2013-07-31 DIAGNOSIS — Y9389 Activity, other specified: Secondary | ICD-10-CM | POA: Insufficient documentation

## 2013-07-31 LAB — URINALYSIS, ROUTINE W REFLEX MICROSCOPIC
Glucose, UA: NEGATIVE mg/dL
Leukocytes, UA: NEGATIVE
Nitrite: NEGATIVE
Specific Gravity, Urine: 1.025 (ref 1.005–1.030)
pH: 6.5 (ref 5.0–8.0)

## 2013-07-31 LAB — CBC
HCT: 31.8 % — ABNORMAL LOW (ref 36.0–46.0)
Hemoglobin: 10.6 g/dL — ABNORMAL LOW (ref 12.0–15.0)
RDW: 13.4 % (ref 11.5–15.5)
WBC: 11.7 10*3/uL — ABNORMAL HIGH (ref 4.0–10.5)

## 2013-07-31 LAB — BASIC METABOLIC PANEL
CO2: 24 mEq/L (ref 19–32)
Chloride: 102 mEq/L (ref 96–112)
Potassium: 3.6 mEq/L (ref 3.5–5.1)
Sodium: 135 mEq/L (ref 135–145)

## 2013-07-31 MED ORDER — ACETAMINOPHEN 500 MG PO TABS
1000.0000 mg | ORAL_TABLET | Freq: Once | ORAL | Status: AC
  Administered 2013-07-31: 1000 mg via ORAL
  Filled 2013-07-31: qty 2

## 2013-07-31 MED ORDER — SODIUM CHLORIDE 0.9 % IV BOLUS (SEPSIS)
1000.0000 mL | Freq: Once | INTRAVENOUS | Status: AC
  Administered 2013-07-31: 1000 mL via INTRAVENOUS

## 2013-07-31 NOTE — Progress Notes (Signed)
Received call from [facility] RN, Neysa Bonito. Pt is 24 weeks and had a syncopel episode

## 2013-07-31 NOTE — Progress Notes (Signed)
Taiked with Radio broadcast assistant. Dr. Hyacinth Meeker has not talked with Dr. Darci Current yet about pt.

## 2013-07-31 NOTE — ED Notes (Signed)
Patient brought in via EMS. Alert and oriented. Airway patent. Per patient felt hot and weak suddenly last night while standing, then fell. Patient states "All I know is I woke up on the floor." Patient c/o right ankle pain. Patient 6 months pregnant. Per patient unsure if she hit her abd but reports waking on floor on her back. Patient denies any abd pain, contractions, or vaginal bleeding. Per patient blood glucose 108.

## 2013-07-31 NOTE — ED Notes (Signed)
Requests pain medications.

## 2013-07-31 NOTE — ED Provider Notes (Signed)
CSN: [id number]     Arrival date & time 07/31/13  1224 History  This chart was scribed for Vida Roller, MD by Bennett Scrape, ED Scribe. This patient was seen in room [facility] and the patient's care was started at 12:50 PM.   Chief Complaint  Patient presents with  . Loss of Consciousness  . Fall  . Ankle Pain    The history is provided by the patient. No language interpreter was used.    HPI Comments: Stephanie Cook is a 27 y.o. female who is 6 months pregnant presents to the Emergency Department brought in by ambulance complaining of one episode of syncope that occurred around 1 AM this morning.  Pt states that she felt hot, became dizzy and woke up on the floor staring at the ceiling after standing. Pt's partner states that he heard the fall and found the pt awake on the floor on her back. He deneis seizure activity or blood loss. Pt denies having prior episodes of similar symptoms. She denies any increased strenuous activity or activities different from her daily routine. She refuses to answer about illegal drug use, specifically marijuana. She states that she got up and went to bed after the episode but was not able to rest due to right ankle pain that started after the fall.  Pt denies HA, CP, SOB, emesis and palpitations as preceding symptoms. She states that she has been eating and drinking normally within the past few days. She reports that she has also been moving her bowels normally. She is G5P3A1 and denies any complications with this current pregnancy or previous ones.  Past Medical History  Diagnosis Date  . Hypertension   . Preterm labor   . STD (female)     chlamydia, trichomonas, gonorrhea, HPV, HSV  . Pregnant   . Hx of trichomoniasis     recurrent, 4 times  . Hx of chlamydia infection     multiple times  . Hx of gonorrhea   . HSV-2 (herpes simplex virus 2) infection    Past Surgical History  Procedure Laterality Date  . No past surgeries     Family  History  Problem Relation Age of Onset  . Mental illness Other   . Hypertension Other    History  Substance Use Topics  . Smoking status: Former Smoker    Types: Cigarettes  . Smokeless tobacco: Never Used     Comment: Quit yesterday per patient  . Alcohol Use: No   OB History   Grav Para Term Preterm Abortions TAB SAB Ect Mult Living   5 3 2 1 1  0 1 0 0 3     Review of Systems  A complete 10 system review of systems was obtained and all systems are negative except as noted in the HPI and PMH.   Allergies  Motrin  Home Medications   No current outpatient prescriptions on file. Triage Vitals: BP 126/84  Pulse 85  Temp(Src) 98.1 F (36.7 C)  Resp 18  Ht 5\' 3"  (1.6 m)  Wt 163 lb (73.936 kg)  BMI 28.88 kg/m2  SpO2 100%  LMP 01/22/2013  Physical Exam  Nursing note and vitals reviewed. Constitutional: She is oriented to person, place, and time. She appears well-developed and well-nourished. No distress.  HENT:  Head: Normocephalic and atraumatic.  Mouth/Throat: Oropharynx is clear and moist.  Eyes: Conjunctivae and EOM are normal. Pupils are equal, round, and reactive to light.  Neck: Neck supple. No tracheal deviation  present.  Cardiovascular: Normal rate, regular rhythm and intact distal pulses.   Pulmonary/Chest: Effort normal and breath sounds normal. No respiratory distress.  Abdominal: Soft. There is no tenderness.  gravid abdomen   Musculoskeletal: Normal range of motion. She exhibits tenderness ( Tenderness to the right ankle with range of motion).  Neurological: She is alert and oriented to person, place, and time.  Sensation is intact  Skin: Skin is warm and dry.  Psychiatric: She has a normal mood and affect. Her behavior is normal.    ED Course   DIAGNOSTIC STUDIES: Oxygen Saturation is 100% on room air, normal by my interpretation.    COORDINATION OF CARE: 1:01 PM-US performed at bedside. Positive fetal heart tones. Discussed treatment plan which  includes CBC panel, BMP and UA with pt at bedside and pt agreed to plan.   Procedures (including critical care time)  Labs Reviewed  BASIC METABOLIC PANEL - Abnormal; Notable for the following:    Creatinine, Ser 0.46 (*)    All other components within normal limits  URINALYSIS, ROUTINE W REFLEX MICROSCOPIC - Abnormal; Notable for the following:    Ketones, ur 15 (*)    All other components within normal limits  CBC - Abnormal; Notable for the following:    WBC 11.7 (*)    RBC 3.67 (*)    Hemoglobin 10.6 (*)    HCT 31.8 (*)    All other components within normal limits   Dg Ankle Complete Right  07/31/2013   *RADIOLOGY REPORT*  Clinical Data: Pain post trauma  RIGHT ANKLE - COMPLETE 3+ VIEW  Comparison: None.  Findings:  Frontal, oblique, and lateral views were obtained. There is no fracture or effusion.  Ankle mortise appears intact. No erosive change.  IMPRESSION: No abnormality noted.   Original Report Authenticated By: Bretta Bang, M.D.   1. Ankle sprain, right, initial encounter   2. Syncope     MDM  ED ECG REPORT  I personally interpreted this EKG   Date: 07/31/2013   Rate: 85  Rhythm: normal sinus rhythm  QRS Axis: normal  Intervals: normal  ST/T Wave abnormalities: normal  Conduction Disutrbances:none  Narrative Interpretation:   Old EKG Reviewed: none available   The patient is well-appearing, she has no abdominal tenderness, she has no signs of ongoing symptoms, no palpitations, no orthostasis, has been eating and drinking normally. She will receive a workup including a urinalysis, laboratory evaluation and fetal monitoring. There has been no vaginal bleeding or loss of fluid, she does have tenderness around her right ankle which will require imaging for further evaluation.  Labs are unremarkable.  There is no signs of irritability or contractions on the monitor - xrays without fractures - stable for d/c with ace wrap, tylenol  Meds given in ED:  Medications   sodium chloride 0.9 % bolus 1,000 mL (not administered)  acetaminophen (TYLENOL) tablet 1,000 mg (not administered)    New Prescriptions   No medications on file       Vida Roller, MD 07/31/13 [phone]

## 2013-07-31 NOTE — Progress Notes (Signed)
Taiked with Neysa Bonito RN fetal monitor adjusted.

## 2013-08-08 ENCOUNTER — Encounter (HOSPITAL_COMMUNITY): Payer: Self-pay | Admitting: *Deleted

## 2013-08-08 ENCOUNTER — Emergency Department (HOSPITAL_COMMUNITY)
Admission: EM | Admit: 2013-08-08 | Discharge: 2013-08-08 | Disposition: A | Discharge status: Home / Self Care | Payer: Medicaid Other | Attending: Emergency Medicine | Admitting: Emergency Medicine

## 2013-08-08 DIAGNOSIS — Z3490 Encounter for supervision of normal pregnancy, unspecified, unspecified trimester: Secondary | ICD-10-CM

## 2013-08-08 DIAGNOSIS — R109 Unspecified abdominal pain: Secondary | ICD-10-CM | POA: Insufficient documentation

## 2013-08-08 DIAGNOSIS — O219 Vomiting of pregnancy, unspecified: Secondary | ICD-10-CM | POA: Insufficient documentation

## 2013-08-08 DIAGNOSIS — O169 Unspecified maternal hypertension, unspecified trimester: Secondary | ICD-10-CM | POA: Insufficient documentation

## 2013-08-08 DIAGNOSIS — Z8619 Personal history of other infectious and parasitic diseases: Secondary | ICD-10-CM | POA: Insufficient documentation

## 2013-08-08 DIAGNOSIS — R42 Dizziness and giddiness: Secondary | ICD-10-CM | POA: Insufficient documentation

## 2013-08-08 DIAGNOSIS — Z87891 Personal history of nicotine dependence: Secondary | ICD-10-CM | POA: Insufficient documentation

## 2013-08-08 DIAGNOSIS — E860 Dehydration: Secondary | ICD-10-CM

## 2013-08-08 DIAGNOSIS — O9989 Other specified diseases and conditions complicating pregnancy, childbirth and the puerperium: Secondary | ICD-10-CM | POA: Insufficient documentation

## 2013-08-08 LAB — CBC WITH DIFFERENTIAL/PLATELET
Lymphocytes Relative: 21 % (ref 12–46)
Lymphs Abs: 2.4 10*3/uL (ref 0.7–4.0)
Neutro Abs: 8.4 10*3/uL — ABNORMAL HIGH (ref 1.7–7.7)
Neutrophils Relative %: 74 % (ref 43–77)
Platelets: 256 10*3/uL (ref 150–400)
RBC: 3.87 MIL/uL (ref 3.87–5.11)
WBC: 11.5 10*3/uL — ABNORMAL HIGH (ref 4.0–10.5)

## 2013-08-08 LAB — BASIC METABOLIC PANEL
CO2: 24 mEq/L (ref 19–32)
Chloride: 99 mEq/L (ref 96–112)
GFR calc non Af Amer: >90 mL/min (ref >90)
Glucose, Bld: 90 mg/dL (ref 70–99)
Potassium: 3.7 mEq/L (ref 3.5–5.1)
Sodium: 133 mEq/L — ABNORMAL LOW (ref 135–145)

## 2013-08-08 LAB — URINALYSIS, ROUTINE W REFLEX MICROSCOPIC
Glucose, UA: NEGATIVE mg/dL
Hgb urine dipstick: NEGATIVE
Specific Gravity, Urine: 1.02 (ref 1.005–1.030)
Urobilinogen, UA: 0.2 mg/dL (ref 0.0–1.0)
pH: 7 (ref 5.0–8.0)

## 2013-08-08 LAB — URINE MICROSCOPIC-ADD ON

## 2013-08-08 MED ORDER — SODIUM CHLORIDE 0.9 % IV BOLUS (SEPSIS)
1000.0000 mL | Freq: Once | INTRAVENOUS | Status: AC
  Administered 2013-08-08: 1000 mL via INTRAVENOUS

## 2013-08-08 NOTE — ED Notes (Signed)
Pt approx [redacted] wks pregnant - reports was at work today when suddenly became hot and dizzy.  States felt like needles were poking in her abd.  Still c/o pain intermittently.

## 2013-08-08 NOTE — ED Notes (Signed)
Pt [redacted] weeks pregnant, c/o lower abd pain today, c/o nausea, denies vomiting or diarrhea

## 2013-08-08 NOTE — ED Notes (Signed)
MD at bedside. 

## 2013-08-08 NOTE — ED Provider Notes (Signed)
CSN: [id number]     Arrival date & time 08/08/13  1508 History     First MD Initiated Contact with Patient 08/08/13 1709     Chief Complaint  Patient presents with  . Dizziness  . Abdominal Pain   (Consider location/radiation/quality/duration/timing/severity/associated sxs/prior Treatment) Patient is a 27 y.o. female presenting with vomiting. The history is provided by the patient (the pt complains of vomiting).  Emesis Severity:  Mild Timing:  Intermittent Quality:  Undigested food Able to tolerate:  Liquids Progression:  Unchanged Chronicity:  New Associated symptoms: no abdominal pain, no diarrhea and no headaches     Past Medical History  Diagnosis Date  . Hypertension   . Preterm labor   . STD (female)     chlamydia, trichomonas, gonorrhea, HPV, HSV  . Pregnant   . Hx of trichomoniasis     recurrent, 4 times  . Hx of chlamydia infection     multiple times  . Hx of gonorrhea   . HSV-2 (herpes simplex virus 2) infection    Past Surgical History  Procedure Laterality Date  . No past surgeries     Family History  Problem Relation Age of Onset  . Mental illness Other   . Hypertension Other    History  Substance Use Topics  . Smoking status: Former Smoker    Types: Cigarettes  . Smokeless tobacco: Never Used     Comment: Quit yesterday per patient  . Alcohol Use: No   OB History   Grav Para Term Preterm Abortions TAB SAB Ect Mult Living   5 3 2 1 1  0 1 0 0 3     Review of Systems  Constitutional: Negative for appetite change and fatigue.  HENT: Negative for congestion, sinus pressure and ear discharge.   Eyes: Negative for discharge.  Respiratory: Negative for cough.   Cardiovascular: Negative for chest pain.  Gastrointestinal: Positive for vomiting. Negative for abdominal pain and diarrhea.  Genitourinary: Negative for frequency and hematuria.  Musculoskeletal: Negative for back pain.  Skin: Negative for rash.  Neurological: Negative for seizures  and headaches.  Psychiatric/Behavioral: Negative for hallucinations.    Allergies  Motrin  Home Medications  No current outpatient prescriptions on file. BP 121/78  Pulse 88  Temp(Src) 98.3 F (36.8 C) (Oral)  Resp 18  Ht 5\' 3"  (1.6 m)  Wt 163 lb (73.936 kg)  BMI 28.88 kg/m2  SpO2 100%  LMP 01/22/2013 Physical Exam  Constitutional: She is oriented to person, place, and time. She appears well-developed.  HENT:  Head: Normocephalic.  Eyes: Conjunctivae and EOM are normal. No scleral icterus.  Neck: Neck supple. No thyromegaly present.  Cardiovascular: Normal rate and regular rhythm.  Exam reveals no gallop and no friction rub.   No murmur heard. Pulmonary/Chest: No stridor. She has no wheezes. She has no rales. She exhibits no tenderness.  Abdominal: She exhibits no distension. There is no tenderness. There is no rebound.  Musculoskeletal: Normal range of motion. She exhibits no edema.  Lymphadenopathy:    She has no cervical adenopathy.  Neurological: She is oriented to person, place, and time. Coordination normal.  Skin: No rash noted. No erythema.  Psychiatric: She has a normal mood and affect. Her behavior is normal.    ED Course   Procedures (including critical care time)  Labs Reviewed  CBC WITH DIFFERENTIAL - Abnormal; Notable for the following:    WBC 11.5 (*)    Hemoglobin 11.3 (*)    HCT 33.7 (*)  Neutro Abs 8.4 (*)    All other components within normal limits  BASIC METABOLIC PANEL - Abnormal; Notable for the following:    Sodium 133 (*)    Creatinine, Ser 0.41 (*)    All other components within normal limits  URINALYSIS, ROUTINE W REFLEX MICROSCOPIC - Abnormal; Notable for the following:    APPearance HAZY (*)    Ketones, ur >80 (*)    Leukocytes, UA TRACE (*)    All other components within normal limits  URINE MICROSCOPIC-ADD ON - Abnormal; Notable for the following:    Squamous Epithelial / LPF FEW (*)    Bacteria, UA FEW (*)    All other  components within normal limits  URINE CULTURE   No results found. 1. Pregnancy   2. Dehydration     MDM    Benny Lennert, MD 08/08/13 2159

## 2013-08-10 LAB — URINE CULTURE: Colony Count: NO GROWTH

## 2013-08-11 ENCOUNTER — Ambulatory Visit (INDEPENDENT_AMBULATORY_CARE_PROVIDER_SITE_OTHER): Payer: Medicaid Other | Admitting: Women's Health

## 2013-08-11 VITALS — BP 110/70 | Wt 166.0 lb

## 2013-08-11 DIAGNOSIS — O09892 Supervision of other high risk pregnancies, second trimester: Secondary | ICD-10-CM

## 2013-08-11 DIAGNOSIS — O09219 Supervision of pregnancy with history of pre-term labor, unspecified trimester: Secondary | ICD-10-CM

## 2013-08-11 DIAGNOSIS — Z1389 Encounter for screening for other disorder: Secondary | ICD-10-CM

## 2013-08-11 DIAGNOSIS — Z331 Pregnant state, incidental: Secondary | ICD-10-CM

## 2013-08-11 DIAGNOSIS — O10012 Pre-existing essential hypertension complicating pregnancy, second trimester: Secondary | ICD-10-CM

## 2013-08-11 DIAGNOSIS — O0992 Supervision of high risk pregnancy, unspecified, second trimester: Secondary | ICD-10-CM

## 2013-08-11 DIAGNOSIS — O10019 Pre-existing essential hypertension complicating pregnancy, unspecified trimester: Secondary | ICD-10-CM

## 2013-08-11 LAB — POCT URINALYSIS DIPSTICK
Blood, UA: NEGATIVE
Glucose, UA: NEGATIVE
Leukocytes, UA: NEGATIVE
Nitrite, UA: NEGATIVE

## 2013-08-11 NOTE — Patient Instructions (Addendum)
You will have your sugar test next visit.  Please do not eat or drink anything after midnight the night before you come, not even water.  You will be here for at least two hours.    Pregnancy - Second Trimester The second trimester of pregnancy (3 to 6 months) is a period of rapid growth for you and your baby. At the end of the sixth month, your baby is about 9 inches long and weighs 1 1/2 pounds. You will begin to feel the baby move between 18 and 20 weeks of the pregnancy. This is called quickening. Weight gain is faster. A clear fluid (colostrum) may leak out of your breasts. You may feel small contractions of the womb (uterus). This is known as false labor or Braxton-Hicks contractions. This is like a practice for labor when the baby is ready to be born. Usually, the problems with morning sickness have usually passed by the end of your first trimester. Some women develop small dark blotches (called cholasma, mask of pregnancy) on their face that usually goes away after the baby is born. Exposure to the sun makes the blotches worse. Acne may also develop in some pregnant women and pregnant women who have acne, may find that it goes away. PRENATAL EXAMS  Blood work may continue to be done during prenatal exams. These tests are done to check on your health and the probable health of your baby. Blood work is used to follow your blood levels (hemoglobin). Anemia (low hemoglobin) is common during pregnancy. Iron and vitamins are given to help prevent this. You will also be checked for diabetes between 24 and 28 weeks of the pregnancy. Some of the previous blood tests may be repeated.  The size of the uterus is measured during each visit. This is to make sure that the baby is continuing to grow properly according to the dates of the pregnancy.  Your blood pressure is checked every prenatal visit. This is to make sure you are not getting toxemia.  Your urine is checked to make sure you do not have an  infection, diabetes or protein in the urine.  Your weight is checked often to make sure gains are happening at the suggested rate. This is to ensure that both you and your baby are growing normally.  Sometimes, an ultrasound is performed to confirm the proper growth and development of the baby. This is a test which bounces harmless sound waves off the baby so your caregiver can more accurately determine due dates. Sometimes, a test is done on the amniotic fluid surrounding the baby. This test is called an amniocentesis. The amniotic fluid is obtained by sticking a needle into the belly (abdomen). This is done to check the chromosomes in instances where there is a concern about possible genetic problems with the baby. It is also sometimes done near the end of pregnancy if an early delivery is required. In this case, it is done to help make sure the baby's lungs are mature enough for the baby to live outside of the womb. CHANGES OCCURING IN THE SECOND TRIMESTER OF PREGNANCY Your body goes through many changes during pregnancy. They vary from person to person. Talk to your caregiver about changes you notice that you are concerned about.  During the second trimester, you will likely have an increase in your appetite. It is normal to have cravings for certain foods. This varies from person to person and pregnancy to pregnancy.  Your lower abdomen will begin to   bulge.  You may have to urinate more often because the uterus and baby are pressing on your bladder. It is also common to get more bladder infections during pregnancy. You can help this by drinking lots of fluids and emptying your bladder before and after intercourse.  You may begin to get stretch marks on your hips, abdomen, and breasts. These are normal changes in the body during pregnancy. There are no exercises or medicines to take that prevent this change.  You may begin to develop swollen and bulging veins (varicose veins) in your legs.  Wearing support hose, elevating your feet for 15 minutes, 3 to 4 times a day and limiting salt in your diet helps lessen the problem.  Heartburn may develop as the uterus grows and pushes up against the stomach. Antacids recommended by your caregiver helps with this problem. Also, eating smaller meals 4 to 5 times a day helps.  Constipation can be treated with a stool softener or adding bulk to your diet. Drinking lots of fluids, and eating vegetables, fruits, and whole grains are helpful.  Exercising is also helpful. If you have been very active up until your pregnancy, most of these activities can be continued during your pregnancy. If you have been less active, it is helpful to start an exercise program such as walking.  Hemorrhoids may develop at the end of the second trimester. Warm sitz baths and hemorrhoid cream recommended by your caregiver helps hemorrhoid problems.  Backaches may develop during this time of your pregnancy. Avoid heavy lifting, wear low heal shoes, and practice good posture to help with backache problems.  Some pregnant women develop tingling and numbness of their hand and fingers because of swelling and tightening of ligaments in the wrist (carpel tunnel syndrome). This goes away after the baby is born.  As your breasts enlarge, you may have to get a bigger bra. Get a comfortable, cotton, support bra. Do not get a nursing bra until the last month of the pregnancy if you will be nursing the baby.  You may get a dark line from your belly button to the pubic area called the linea nigra.  You may develop rosy cheeks because of increase blood flow to the face.  You may develop spider looking lines of the face, neck, arms, and chest. These go away after the baby is born. HOME CARE INSTRUCTIONS   It is extremely important to avoid all smoking, herbs, alcohol, and unprescribed drugs during your pregnancy. These chemicals affect the formation and growth of the baby. Avoid  these chemicals throughout the pregnancy to ensure the delivery of a healthy infant.  Most of your home care instructions are the same as suggested for the first trimester of your pregnancy. Keep your caregiver's appointments. Follow your caregiver's instructions regarding medicine use, exercise, and diet.  During pregnancy, you are providing food for you and your baby. Continue to eat regular, well-balanced meals. Choose foods such as meat, fish, milk and other low fat dairy products, vegetables, fruits, and whole-grain breads and cereals. Your caregiver will tell you of the ideal weight gain.  A physical sexual relationship may be continued up until near the end of pregnancy if there are no other problems. Problems could include early (premature) leaking of amniotic fluid from the membranes, vaginal bleeding, abdominal pain, or other medical or pregnancy problems.  Exercise regularly if there are no restrictions. Check with your caregiver if you are unsure of the safety of some of your exercises. The   greatest weight gain will occur in the last 2 trimesters of pregnancy. Exercise will help you:  Control your weight.  Get you in shape for labor and delivery.  Lose weight after you have the baby.  Wear a good support or jogging bra for breast tenderness during pregnancy. This may help if worn during sleep. Pads or tissues may be used in the bra if you are leaking colostrum.  Do not use hot tubs, steam rooms or saunas throughout the pregnancy.  Wear your seat belt at all times when driving. This protects you and your baby if you are in an accident.  Avoid raw meat, uncooked cheese, cat litter boxes, and soil used by cats. These carry germs that can cause birth defects in the baby.  The second trimester is also a good time to visit your dentist for your dental health if this has not been done yet. Getting your teeth cleaned is okay. Use a soft toothbrush. Brush gently during pregnancy.  It is  easier to leak urine during pregnancy. Tightening up and strengthening the pelvic muscles will help with this problem. Practice stopping your urination while you are going to the bathroom. These are the same muscles you need to strengthen. It is also the muscles you would use as if you were trying to stop from passing gas. You can practice tightening these muscles up 10 times a set and repeating this about 3 times per day. Once you know what muscles to tighten up, do not perform these exercises during urination. It is more likely to contribute to an infection by backing up the urine.  Ask for help if you have financial, counseling, or nutritional needs during pregnancy. Your caregiver will be able to offer counseling for these needs as well as refer you for other special needs.  Your skin may become oily. If so, wash your face with mild soap, use non-greasy moisturizer and oil or cream based makeup. MEDICINES AND DRUG USE IN PREGNANCY  Take prenatal vitamins as directed. The vitamin should contain 1 milligram of folic acid. Keep all vitamins out of reach of children. Only a couple vitamins or tablets containing iron may be fatal to a baby or young child when ingested.  Avoid use of all medicines, including herbs, over-the-counter medicines, not prescribed or suggested by your caregiver. Only take over-the-counter or prescription medicines for pain, discomfort, or fever as directed by your caregiver. Do not use aspirin.  Let your caregiver also know about herbs you may be using.  Alcohol is related to a number of birth defects. This includes fetal alcohol syndrome. All alcohol, in any form, should be avoided completely. Smoking will cause low birth rate and premature babies.  Street or illegal drugs are very harmful to the baby. They are absolutely forbidden. A baby born to an addicted mother will be addicted at birth. The baby will go through the same withdrawal an adult does. SEEK MEDICAL CARE IF:    You have any concerns or worries during your pregnancy. It is better to call with your questions if you feel they cannot wait, rather than worry about them. SEEK IMMEDIATE MEDICAL CARE IF:   An unexplained oral temperature above 102 F (38.9 C) develops, or as your caregiver suggests.  You have leaking of fluid from the vagina (birth canal). If leaking membranes are suspected, take your temperature and tell your caregiver of this when you call.  There is vaginal spotting, bleeding, or passing clots. Tell your caregiver of   the amount and how many pads are used. Light spotting in pregnancy is common, especially following intercourse.  You develop a bad smelling vaginal discharge with a change in the color from clear to white.  You continue to feel sick to your stomach (nauseated) and have no relief from remedies suggested. You vomit blood or coffee ground-like materials.  You lose more than 2 pounds of weight or gain more than 2 pounds of weight over 1 week, or as suggested by your caregiver.  You notice swelling of your face, hands, feet, or legs.  You get exposed to German measles and have never had them.  You are exposed to fifth disease or chickenpox.  You develop belly (abdominal) pain. Round ligament discomfort is a common non-cancerous (benign) cause of abdominal pain in pregnancy. Your caregiver still must evaluate you.  You develop a bad headache that does not go away.  You develop fever, diarrhea, pain with urination, or shortness of breath.  You develop visual problems, blurry, or double vision.  You fall or are in a car accident or any kind of trauma.  There is mental or physical violence at home. Document Released: 11/28/2001 Document Revised: 08/28/2012 Document Reviewed: 06/02/2009 [employer] Patient Information 2014 [employer].  

## 2013-08-11 NOTE — Progress Notes (Signed)
Pt here today for routine visit. Pt states she is still having the same pressure as before, but now also having fainting spells. Pt denies any other problems or concerns at this time.

## 2013-08-11 NOTE — Progress Notes (Signed)
Reports good fm. Denies uc's, lof, vb, urinary frequency, urgency, hesitancy, or dysuria.  2 episodes of feeling hot and dizzy, fainted the first time, went to ED- told she was dehydrated.  Sounds like hypotension vs. Hypoglycemia. Admits to not taking Aldomet in about 3 months d/t dog chewing up her rx bottle and being unable to refill it. BPs wnl right now, told her OK to remain off of aldomet for now.  Recommended staying hydrated, eating small frequent meals/snacks. If feels faint, lay down and drink juice/eat snack.  Missed 17P last week d/t being in ED, discussed importance of weekly administration. Reviewed ptl s/s, fm.  All questions answered. F/U in 3wks for pn2, growth u/s, and visit.

## 2013-09-01 ENCOUNTER — Ambulatory Visit (INDEPENDENT_AMBULATORY_CARE_PROVIDER_SITE_OTHER): Payer: Medicaid Other | Admitting: Women's Health

## 2013-09-01 ENCOUNTER — Other Ambulatory Visit: Payer: Medicaid Other

## 2013-09-01 ENCOUNTER — Encounter: Payer: Self-pay | Admitting: Women's Health

## 2013-09-01 ENCOUNTER — Ambulatory Visit (INDEPENDENT_AMBULATORY_CARE_PROVIDER_SITE_OTHER): Payer: Medicaid Other

## 2013-09-01 VITALS — BP 130/80 | Wt 166.0 lb

## 2013-09-01 DIAGNOSIS — O99019 Anemia complicating pregnancy, unspecified trimester: Secondary | ICD-10-CM

## 2013-09-01 DIAGNOSIS — Z331 Pregnant state, incidental: Secondary | ICD-10-CM

## 2013-09-01 DIAGNOSIS — Z3483 Encounter for supervision of other normal pregnancy, third trimester: Secondary | ICD-10-CM

## 2013-09-01 DIAGNOSIS — O10019 Pre-existing essential hypertension complicating pregnancy, unspecified trimester: Secondary | ICD-10-CM

## 2013-09-01 DIAGNOSIS — O10012 Pre-existing essential hypertension complicating pregnancy, second trimester: Secondary | ICD-10-CM

## 2013-09-01 DIAGNOSIS — O10013 Pre-existing essential hypertension complicating pregnancy, third trimester: Secondary | ICD-10-CM

## 2013-09-01 DIAGNOSIS — O09219 Supervision of pregnancy with history of pre-term labor, unspecified trimester: Secondary | ICD-10-CM

## 2013-09-01 DIAGNOSIS — O0993 Supervision of high risk pregnancy, unspecified, third trimester: Secondary | ICD-10-CM

## 2013-09-01 DIAGNOSIS — Z1389 Encounter for screening for other disorder: Secondary | ICD-10-CM

## 2013-09-01 LAB — HIV ANTIBODY (ROUTINE TESTING W REFLEX): HIV: NONREACTIVE

## 2013-09-01 LAB — CBC
Hemoglobin: 10.3 g/dL — ABNORMAL LOW (ref 12.0–15.0)
RBC: 3.56 MIL/uL — ABNORMAL LOW (ref 3.87–5.11)

## 2013-09-01 MED ORDER — HYDROXYPROGESTERONE CAPROATE 250 MG/ML IM OIL
250.0000 mg | TOPICAL_OIL | Freq: Once | INTRAMUSCULAR | Status: AC
  Administered 2013-09-01: 250 mg via INTRAMUSCULAR

## 2013-09-01 NOTE — Progress Notes (Signed)
Reports good fm. Denies lof, vb, urinary frequency, urgency, hesitancy, or dysuria.  Reports cramping/uc's q x 1 week. Hasn't has 17P since last visit- states she can't remember to come. Sex last night. Spec exam: cx visually closed. SVE: ft/th/high. To increase po fluids, empty bladder frequently, pelvic rest. Not taking BP meds, bp wnl right now, OK to remain off for now. For growth u/s today after visit w/ me.  PN 2 today. Reviewed importance of 17P weekly, ptl s/s, fkc.  All questions answered. F/U in 4wks to begin 2x/wk testing u/s alternating w/ nst, and visit.

## 2013-09-01 NOTE — Patient Instructions (Addendum)

## 2013-09-01 NOTE — Progress Notes (Signed)
U/S(28+4wks)-vtx active fetus, approp growth EFW 3 lb, (61st%tile), fluid wnl, ant grade 1 plac, cx appears closed, female fetus, FHR=140bpm

## 2013-09-01 NOTE — Progress Notes (Signed)
For PN2 Today. 

## 2013-09-02 ENCOUNTER — Encounter: Payer: Self-pay | Admitting: Women's Health

## 2013-09-02 DIAGNOSIS — O99019 Anemia complicating pregnancy, unspecified trimester: Secondary | ICD-10-CM | POA: Insufficient documentation

## 2013-09-02 LAB — GLUCOSE TOLERANCE, 2 HOURS W/ 1HR
Glucose, 1 hour: 178 mg/dL — ABNORMAL HIGH (ref 70–170)
Glucose, 2 hour: 114 mg/dL (ref 70–139)
Glucose, Fasting: 84 mg/dL (ref 70–99)

## 2013-09-02 LAB — ANTIBODY SCREEN: Antibody Screen: NEGATIVE

## 2013-09-05 ENCOUNTER — Telehealth: Payer: Self-pay | Admitting: Obstetrics & Gynecology

## 2013-09-05 ENCOUNTER — Ambulatory Visit (INDEPENDENT_AMBULATORY_CARE_PROVIDER_SITE_OTHER): Payer: Medicaid Other | Admitting: Obstetrics & Gynecology

## 2013-09-05 ENCOUNTER — Encounter: Payer: Self-pay | Admitting: Obstetrics & Gynecology

## 2013-09-05 VITALS — BP 138/90 | Wt 166.5 lb

## 2013-09-05 DIAGNOSIS — O2390 Unspecified genitourinary tract infection in pregnancy, unspecified trimester: Secondary | ICD-10-CM

## 2013-09-05 DIAGNOSIS — Z331 Pregnant state, incidental: Secondary | ICD-10-CM

## 2013-09-05 DIAGNOSIS — O10019 Pre-existing essential hypertension complicating pregnancy, unspecified trimester: Secondary | ICD-10-CM

## 2013-09-05 DIAGNOSIS — Z1389 Encounter for screening for other disorder: Secondary | ICD-10-CM

## 2013-09-05 DIAGNOSIS — O99019 Anemia complicating pregnancy, unspecified trimester: Secondary | ICD-10-CM

## 2013-09-05 DIAGNOSIS — O09219 Supervision of pregnancy with history of pre-term labor, unspecified trimester: Secondary | ICD-10-CM

## 2013-09-05 DIAGNOSIS — O98519 Other viral diseases complicating pregnancy, unspecified trimester: Secondary | ICD-10-CM

## 2013-09-05 DIAGNOSIS — A5901 Trichomonal vulvovaginitis: Secondary | ICD-10-CM

## 2013-09-05 LAB — POCT URINALYSIS DIPSTICK
Blood, UA: NEGATIVE
Ketones, UA: NEGATIVE
Protein, UA: NEGATIVE

## 2013-09-05 MED ORDER — METRONIDAZOLE 500 MG PO TABS: 500.0000 mg | ORAL_TABLET | Freq: Two times a day (BID) | ORAL | Status: DC

## 2013-09-05 NOTE — Progress Notes (Signed)
Wet pre  Trichomonas  Flagyl 500 BID x 7days Cervix LTC BP weight and urine results all reviewed and noted. Patient reports good fetal movement, denies any bleeding and no rupture of membranes symptoms or regular contractions. Patient is without complaints. All questions were answered.

## 2013-09-05 NOTE — Telephone Encounter (Signed)
Pt c/o vomiting today ever since having intense abdominal pain. Pt told to come in now to be evaluated. Pt verbalized understanding.

## 2013-09-15 ENCOUNTER — Telehealth: Payer: Self-pay | Admitting: Women's Health

## 2013-09-15 NOTE — Telephone Encounter (Signed)
Pt states lost mucus plug yesterday, irregular contractions, no bleeding, +FM. Pt informed for contractions 5-10 min apart, gush fluids, vaginal bleeding, decrease FM go to [facility] or call our office. Pt verbalized understanding.

## 2013-09-23 ENCOUNTER — Other Ambulatory Visit: Payer: Self-pay | Admitting: Obstetrics & Gynecology

## 2013-09-23 DIAGNOSIS — O10013 Pre-existing essential hypertension complicating pregnancy, third trimester: Secondary | ICD-10-CM

## 2013-09-28 ENCOUNTER — Inpatient Hospital Stay (HOSPITAL_COMMUNITY)
Admission: EM | Admit: 2013-09-28 | Discharge: 2013-09-29 | Disposition: A | Discharge status: Home / Self Care | Payer: Medicaid Other | Attending: Obstetrics & Gynecology | Admitting: Obstetrics & Gynecology

## 2013-09-28 ENCOUNTER — Encounter (HOSPITAL_COMMUNITY): Payer: Self-pay | Admitting: Emergency Medicine

## 2013-09-28 DIAGNOSIS — O26899 Other specified pregnancy related conditions, unspecified trimester: Secondary | ICD-10-CM

## 2013-09-28 DIAGNOSIS — B009 Herpesviral infection, unspecified: Secondary | ICD-10-CM

## 2013-09-28 DIAGNOSIS — O10013 Pre-existing essential hypertension complicating pregnancy, third trimester: Secondary | ICD-10-CM

## 2013-09-28 DIAGNOSIS — N898 Other specified noninflammatory disorders of vagina: Secondary | ICD-10-CM

## 2013-09-28 DIAGNOSIS — O99891 Other specified diseases and conditions complicating pregnancy: Secondary | ICD-10-CM | POA: Insufficient documentation

## 2013-09-28 DIAGNOSIS — R109 Unspecified abdominal pain: Secondary | ICD-10-CM | POA: Insufficient documentation

## 2013-09-28 DIAGNOSIS — O10019 Pre-existing essential hypertension complicating pregnancy, unspecified trimester: Secondary | ICD-10-CM | POA: Insufficient documentation

## 2013-09-28 HISTORY — DX: Major depressive disorder, single episode, unspecified: F32.9

## 2013-09-28 HISTORY — DX: Depression, unspecified: F32.A

## 2013-09-28 LAB — URINALYSIS, ROUTINE W REFLEX MICROSCOPIC
Glucose, UA: NEGATIVE mg/dL
Ketones, ur: NEGATIVE mg/dL
Leukocytes, UA: NEGATIVE
Nitrite: NEGATIVE
Protein, ur: NEGATIVE mg/dL
Urobilinogen, UA: 1 mg/dL (ref 0.0–1.0)

## 2013-09-28 LAB — CBC WITH DIFFERENTIAL/PLATELET
Basophils Absolute: 0 10*3/uL (ref 0.0–0.1)
Basophils Relative: 0 % (ref 0–1)
Eosinophils Absolute: 0.1 10*3/uL (ref 0.0–0.7)
Eosinophils Relative: 1 % (ref 0–5)
HCT: 30 % — ABNORMAL LOW (ref 36.0–46.0)
Hemoglobin: 10.1 g/dL — ABNORMAL LOW (ref 12.0–15.0)
Lymphocytes Relative: 23 % (ref 12–46)
Lymphs Abs: 2.2 10*3/uL (ref 0.7–4.0)
MCH: 28.6 pg (ref 26.0–34.0)
MCHC: 33.7 g/dL (ref 30.0–36.0)
MCV: 85 fL (ref 78.0–100.0)
Monocytes Absolute: 0.7 10*3/uL (ref 0.1–1.0)
Monocytes Relative: 7 % (ref 3–12)
Neutro Abs: 6.8 10*3/uL (ref 1.7–7.7)
Neutrophils Relative %: 69 % (ref 43–77)
Platelets: 247 10*3/uL (ref 150–400)
RBC: 3.53 MIL/uL — ABNORMAL LOW (ref 3.87–5.11)
RDW: 13.3 % (ref 11.5–15.5)
WBC: 9.8 10*3/uL (ref 4.0–10.5)

## 2013-09-28 LAB — COMPREHENSIVE METABOLIC PANEL
ALT: 16 U/L (ref 0–35)
AST: 20 U/L (ref 0–37)
Albumin: 2.5 g/dL — ABNORMAL LOW (ref 3.5–5.2)
Alkaline Phosphatase: 88 U/L (ref 39–117)
BUN: 4 mg/dL — ABNORMAL LOW (ref 6–23)
CO2: 22 mEq/L (ref 19–32)
Calcium: 9.4 mg/dL (ref 8.4–10.5)
Chloride: 103 mEq/L (ref 96–112)
Creatinine, Ser: 0.43 mg/dL — ABNORMAL LOW (ref 0.50–1.10)
GFR calc Af Amer: >90 mL/min (ref >90)
GFR calc non Af Amer: >90 mL/min (ref >90)
Glucose, Bld: 112 mg/dL — ABNORMAL HIGH (ref 70–99)
Potassium: 3.2 mEq/L — ABNORMAL LOW (ref 3.5–5.1)
Sodium: 135 mEq/L (ref 135–145)
Total Bilirubin: 0.6 mg/dL (ref 0.3–1.2)
Total Protein: 6.5 g/dL (ref 6.0–8.3)

## 2013-09-28 MED ORDER — SODIUM CHLORIDE 0.9 % IV BOLUS (SEPSIS)
1000.0000 mL | Freq: Once | INTRAVENOUS | Status: AC
  Administered 2013-09-28: 1000 mL via INTRAVENOUS

## 2013-09-28 MED ORDER — MAGNESIUM SULFATE 50 % IJ SOLN: 6.0000 g | INTRAVENOUS | Status: DC

## 2013-09-28 NOTE — ED Notes (Signed)
[facility] cancelled. [facility] called and on the way.

## 2013-09-28 NOTE — ED Notes (Signed)
Urine not obtained

## 2013-09-28 NOTE — ED Notes (Signed)
[facility] called 

## 2013-09-28 NOTE — ED Provider Notes (Signed)
CSN: [id number]     Arrival date & time 09/28/13  2144 History   First MD Initiated Contact with Patient 09/28/13 2150     Chief Complaint  Patient presents with  . Abdominal Cramping   (Consider location/radiation/quality/duration/timing/severity/associated sxs/prior Treatment) HPI Comments: Pt is a 27 y/o G5P3 with hx of one miscarriage who has had 3 premature children in the past, 2 at 8 months and the last at 7 months who is currently at [redacted] weeks gestation by her report.  She has had acute onset of cramping pain in her lower abdomen that started in the last 2 hours - has been intermittent, comes in waves, feels like prior contractions and is not associated with vomiting, nausea, sob, cough.  She does have some back pressure with this and nothing seems to make it better or worse.  She walked here from the [employer] which is approximately 2 miles from the hospital.  She also felt as though her water had broken - had a splash of fluid when she stood up.  She has hx of needing blood pressure medications but states that she is not on them now as she took herself off of them.  She states that she has been drinking ETOH and smoking cigarrettes throughot the pregnancy - last drink was this week.  She denies cocaine use.    Patient is a 27 y.o. female presenting with cramps. The history is provided by the patient and medical records.  Abdominal Cramping    Past Medical History  Diagnosis Date  . Hypertension   . Preterm labor   . STD (female)     chlamydia, trichomonas, gonorrhea, HPV, HSV  . Pregnant   . Hx of trichomoniasis     recurrent, 4 times  . Hx of chlamydia infection     multiple times  . Hx of gonorrhea   . HSV-2 (herpes simplex virus 2) infection    Past Surgical History  Procedure Laterality Date  . No past surgeries     Family History  Problem Relation Age of Onset  . Mental illness Other   . Hypertension Other    History  Substance Use Topics  . Smoking status:  Current Every Day Smoker    Types: Cigarettes  . Smokeless tobacco: Never Used     Comment: Quit yesterday per patient  . Alcohol Use: No   OB History   Grav Para Term Preterm Abortions TAB SAB Ect Mult Living   5 3 2 1 1  0 1 0 0 3     Review of Systems  All other systems reviewed and are negative.    Allergies  Motrin  Home Medications   Current Outpatient Rx  Name  Route  Sig  Dispense  Refill  . metroNIDAZOLE (FLAGYL) 500 MG tablet   Oral   Take 1 tablet (500 mg total) by mouth 2 (two) times daily.   14 tablet   1   . Pediatric Multiple Vit-C-FA (FLINSTONES GUMMIES OMEGA-3 DHA PO)   Oral   Take by mouth. Take 2 daily          BP 141/89  Pulse 102  Temp(Src) 98.7 F (37.1 C) (Oral)  Resp 24  Ht 5\' 2"  (1.575 m)  Wt 166 lb (75.297 kg)  BMI 30.35 kg/m2  SpO2 98%  LMP 01/22/2013 Physical Exam  Nursing note and vitals reviewed. Constitutional: She appears well-developed and well-nourished. No distress.  HENT:  Head: Normocephalic and atraumatic.  Mouth/Throat: Oropharynx  is clear and moist. No oropharyngeal exudate.  Eyes: Conjunctivae and EOM are normal. Pupils are equal, round, and reactive to light. Right eye exhibits no discharge. Left eye exhibits no discharge. No scleral icterus.  Neck: Normal range of motion. Neck supple. No JVD present. No thyromegaly present.  Cardiovascular: Normal rate, regular rhythm, normal heart sounds and intact distal pulses.  Exam reveals no gallop and no friction rub.   No murmur heard. Pulmonary/Chest: Effort normal and breath sounds normal. No respiratory distress. She has no wheezes. She has no rales.  Abdominal: Soft. Bowel sounds are normal. She exhibits no distension and no mass. There is no tenderness.  Gravid non tender abdomen, normal fetal movements per mother and on exam.    Genitourinary:  Chaperon present for vaginal exam - bimanual with sterile gloves reveals station is high, cervix is closed, soft and long.  No  blood in vaginal vault - nitrazine test is neg per nursing.  Musculoskeletal: Normal range of motion. She exhibits no edema and no tenderness.  Lymphadenopathy:    She has no cervical adenopathy.  Neurological: She is alert. Coordination normal.  Skin: Skin is warm and dry. No rash noted. No erythema.  Psychiatric: She has a normal mood and affect. Her behavior is normal.    ED Course  Procedures (including critical care time) Labs Review Labs Reviewed  CBC WITH DIFFERENTIAL  COMPREHENSIVE METABOLIC PANEL  URINALYSIS, ROUTINE W REFLEX MICROSCOPIC   Imaging Review No results found.  EKG Interpretation   None       MDM   1. Anemia in pregnancy, second trimester   2. H/O preterm delivery, currently pregnant, second trimester   3. HSV-2 infection   4. Preterm labor without delivery in third trimester    The pt has ongoing pain which is c/w contractions - on monitroing it appears she is contracting every 4 minutes.    D/w Dr. Despina Hidden - requests no mag at this time.  Pt in agreement for transfer.    Vida Roller, MD 09/28/13 2218

## 2013-09-28 NOTE — ED Notes (Signed)
Spoke with Katie at [facility] to notify of patient.

## 2013-09-28 NOTE — MAU Note (Signed)
Patient transferred from [facility] for further evaluation of preterm contractions. Patient states she started contracting every 5-10 around 6pm. Starting leaking fluid around the same time. Denies vaginal bleeding

## 2013-09-28 NOTE — ED Notes (Addendum)
Pt is pregnant with her 4th child and is [redacted] wks pregnant. Pt c/o abdominal cramping, pressure, and questions whether her water broke. Pt reports that when she goes to the restroom, she feels like the babies head is coming out. Pts due date is December 4th, 2014. Pt walked about 2.5 miles to get here. Pt states all of her babies were born early. 2 were born at 8 months and the 3rd baby was born at 7 months.

## 2013-09-28 NOTE — Progress Notes (Signed)
[facility] spoke with Dr Lutricia Feil attending) and told of pt history, fhr, uc pattern-orders for pt to be transferred to [facility].  RN spoke with French Ana at [facility] and told of OB's orders, gave # so ED physician can call/consult Dr Despina Hidden

## 2013-09-29 ENCOUNTER — Encounter (HOSPITAL_COMMUNITY): Payer: Self-pay | Admitting: *Deleted

## 2013-09-29 ENCOUNTER — Other Ambulatory Visit: Payer: Medicaid Other

## 2013-09-29 ENCOUNTER — Encounter: Payer: Medicaid Other | Admitting: Women's Health

## 2013-09-29 DIAGNOSIS — O10019 Pre-existing essential hypertension complicating pregnancy, unspecified trimester: Secondary | ICD-10-CM

## 2013-09-29 DIAGNOSIS — O4700 False labor before 37 completed weeks of gestation, unspecified trimester: Secondary | ICD-10-CM

## 2013-09-29 DIAGNOSIS — N898 Other specified noninflammatory disorders of vagina: Secondary | ICD-10-CM

## 2013-09-29 LAB — WET PREP, GENITAL
Trich, Wet Prep: NONE SEEN
Yeast Wet Prep HPF POC: NONE SEEN

## 2013-09-29 LAB — GC/CHLAMYDIA PROBE AMP
CT Probe RNA: NEGATIVE
GC Probe RNA: NEGATIVE

## 2013-09-29 LAB — PROTEIN / CREATININE RATIO, URINE: Creatinine, Urine: 26.35 mg/dL

## 2013-09-29 NOTE — MAU Provider Note (Signed)
Chief Complaint:  Abdominal Cramping   Stephanie Cook is a 27 y.o.  [phone] with IUP at [redacted]w[redacted]d presenting for Abdominal Cramping  Pt states she was sitting at [facility] earlier this evening around 6 pm and stood up to go to the restroom and felt something trickle down her leg.  Immediately had a sharp pain in her lower abdomen that lasted for 5 minutes, would go away for a second and then return again for another 5 minutes.  She had good FM throughout and no vaginal bleeding. No further leakage of fluid after that initial episode.  She walked to [employer] from [employer].  There she was found to have elevated blood pressures, and given her story, they called to see if she could be transferred here for further evaluation.  Pt states that while there she had another episode of this sharp pain but none since then which was 3 hours ago. Of note, she does state that she had intercourse 2 hours prior to this initial episode and had not used the restroom since that time.   Pt receives her care at [facility] but has not been seen since 9/19 as she says she was beign seen every 4 weeks.  She has a hx of PTD at 33 weeks with spontaneous labor and is supposed to be on 17-OHP but forgets to come in for it so has not been getting it.  Pt also supposed to be on aldomet for cHTN but states she doesn't take it until the last few weeks because her BP is usually better during pregnancy until the end. She has a hx of trich recently treated and has had multiple STDs in the past.     Menstrual History: OB History   Grav Para Term Preterm Abortions TAB SAB Ect Mult Living   5 3 2 1 1  0 1 0 0 3    G1- term G2- term G3- 33 week, PT delivery.   G4- SAB  Patient's last menstrual period was 01/22/2013.      Past Medical History  Diagnosis Date  . Hypertension   . Preterm labor   . STD (female)     chlamydia, trichomonas, gonorrhea, HPV, HSV  . Pregnant   . Hx of trichomoniasis     recurrent, 4 times   . Hx of chlamydia infection     multiple times  . Hx of gonorrhea   . HSV-2 (herpes simplex virus 2) infection   . Depression     Past Surgical History  Procedure Laterality Date  . No past surgeries      Family History  Problem Relation Age of Onset  . Mental illness Other   . Hypertension Other     History  Substance Use Topics  . Smoking status: Current Every Day Smoker    Types: Cigarettes  . Smokeless tobacco: Never Used     Comment: Quit yesterday per patient  . Alcohol Use: No      Allergies  Allergen Reactions  . Motrin [Ibuprofen] Hives    Prescriptions prior to admission  Medication Sig Dispense Refill  . Pediatric Multiple Vit-C-FA (FLINSTONES GUMMIES OMEGA-3 DHA PO) Take 1 capsule by mouth daily.         Review of Systems - Negative except for what is mentioned in HPI.  Physical Exam  Blood pressure 136/90, pulse 88, temperature 97.5 F (36.4 C), temperature source Oral, resp. rate 20, height 5\' 2"  (1.575 m), weight 75.297 kg (166  lb), last menstrual period 01/22/2013, SpO2 100.00%. BP range 123-141/86-97 (one erroneous value of 127/113 that on immediate recheck was within range of above) GENERAL: Well-developed, well-nourished female in no acute distress.  LUNGS: Clear to auscultation bilaterally.  HEART: Regular rate and rhythm. ABDOMEN: Soft, nontender, nondistended, gravid.  EXTREMITIES: Nontender, no edema, 2+ distal pulses. Cervical Exam: Dilatation 0.5 cm   Effacement 0%   Station ballotable. Palpable stool in the vault.  SSE: normal labia, urethra, vagina, cervix.  Some watery discharge in vault with ?pool. Neg fern.   FHT:  Baseline rate 140 bpm   Variability moderate  Accelerations present   Decelerations none Contractions: occasional- not felt by pt.    Labs: Results for orders placed during the hospital encounter of 09/28/13 (from the past 24 hour(s))  CBC WITH DIFFERENTIAL   Collection Time    09/28/13 10:36 PM      Result Value  Range   WBC 9.8  4.0 - 10.5 K/uL   RBC 3.53 (*) 3.87 - 5.11 MIL/uL   Hemoglobin 10.1 (*) 12.0 - 15.0 g/dL   HCT 16.1 (*) 09.6 - 04.5 %   MCV 85.0  78.0 - 100.0 fL   MCH 28.6  26.0 - 34.0 pg   MCHC 33.7  30.0 - 36.0 g/dL   RDW 40.9  81.1 - 91.4 %   Platelets 247  150 - 400 K/uL   Neutrophils Relative % 69  43 - 77 %   Neutro Abs 6.8  1.7 - 7.7 K/uL   Lymphocytes Relative 23  12 - 46 %   Lymphs Abs 2.2  0.7 - 4.0 K/uL   Monocytes Relative 7  3 - 12 %   Monocytes Absolute 0.7  0.1 - 1.0 K/uL   Eosinophils Relative 1  0 - 5 %   Eosinophils Absolute 0.1  0.0 - 0.7 K/uL   Basophils Relative 0  0 - 1 %   Basophils Absolute 0.0  0.0 - 0.1 K/uL  COMPREHENSIVE METABOLIC PANEL   Collection Time    09/28/13 10:36 PM      Result Value Range   Sodium 135  135 - 145 mEq/L   Potassium 3.2 (*) 3.5 - 5.1 mEq/L   Chloride 103  96 - 112 mEq/L   CO2 22  19 - 32 mEq/L   Glucose, Bld 112 (*) 70 - 99 mg/dL   BUN 4 (*) 6 - 23 mg/dL   Creatinine, Ser 7.82 (*) 0.50 - 1.10 mg/dL   Calcium 9.4  8.4 - 95.6 mg/dL   Total Protein 6.5  6.0 - 8.3 g/dL   Albumin 2.5 (*) 3.5 - 5.2 g/dL   AST 20  0 - 37 U/L   ALT 16  0 - 35 U/L   Alkaline Phosphatase 88  39 - 117 U/L   Total Bilirubin 0.6  0.3 - 1.2 mg/dL   GFR calc non Af Amer >90  >90 mL/min   GFR calc Af Amer >90  >90 mL/min  URINALYSIS, ROUTINE W REFLEX MICROSCOPIC   Collection Time    09/28/13 11:20 PM      Result Value Range   Color, Urine YELLOW  YELLOW   APPearance CLEAR  CLEAR   Specific Gravity, Urine 1.010  1.005 - 1.030   pH 7.0  5.0 - 8.0   Glucose, UA NEGATIVE  NEGATIVE mg/dL   Hgb urine dipstick NEGATIVE  NEGATIVE   Bilirubin Urine NEGATIVE  NEGATIVE   Ketones, ur NEGATIVE  NEGATIVE  mg/dL   Protein, ur NEGATIVE  NEGATIVE mg/dL   Urobilinogen, UA 1.0  0.0 - 1.0 mg/dL   Nitrite NEGATIVE  NEGATIVE   Leukocytes, UA NEGATIVE  NEGATIVE    Imaging Studies:  US Ob Follow Up  09/01/2013   FOLLOW UP SONOGRAM   [place] is in  the office for a follow up sonogram for Repeat  growth u/s.  She is a 27 y.o. year old [phone] with Estimated Date of Delivery: 11/20/13  by early ultrasound now at  [redacted]w[redacted]d weeks gestation. Thus far the pregnancy  has been complicated by [facility] and h/o PTD.  GESTATION: SINGLETON  PRESENTATION: cephalic  FETAL ACTIVITY:          Heart rate         140 bpm          The fetus is active.  AMNIOTIC FLUID: The amniotic fluid volume is  normal,   PLACENTA LOCALIZATION:  anterior GRADE 1  CERVIX: Appears closed   ADNEXA: The ovaries are normal.   GESTATIONAL AGE AND  BIOMETRICS:  Gestational criteria: Estimated Date of Delivery: 11/20/13 by early  ultrasound now at [redacted]w[redacted]d  Previous Scans:  GESTATIONAL SAC            mm          weeks  CROWN RUMP LENGTH            mm          weeks  NUCHAL TRANSLUCENCY            mm           BIPARIETAL DIAMETER           7.27 cm         29+1 weeks  HEAD CIRCUMFERENCE           27.37 cm         29+6 weeks  ABDOMINAL CIRCUMFERENCE           25.01 cm         29+2 weeks  FEMUR LENGTH           5.38 cm         28+4 weeks                                                           AVERAGE EGA(BY THIS SCAN):   29+1 weeks                                                 ESTIMATED FETAL WEIGHT:        1347  grams, 61 % ANATOMICAL SURVEY                                                                             COMMENTS CEREBRAL VENTRICLES yes normal   CHOROID PLEXUS yes normal   CEREBELLUM  yes normal   CISTERNA MAGNA yes normal   NUCHAL REGION yes normal   ORBITS yes normal   NASAL BONE yes normal   NOSE/LIP yes normal   FACIAL PROFILE yes normal   4 CHAMBERED HEART yes normal   OUTFLOW TRACTS yes normal   DIAPHRAGM yes normal   STOMACH yes normal   RENAL REGION yes normal   BLADDER yes normal   CORD INSERTION yes normal   3 VESSEL CORD yes normal   SPINE yes normal   ARMS/HANDS yes normal   LEGS/FEET yes normal   GENITALIA yes normal female        SUSPECTED ABNORMALITIES:  no  QUALITY OF SCAN: satisfactory   TECHNICIAN COMMENTS:  U/S(28+4wks)-vtx active fetus, approp growth EFW 3 lb, (61st%tile), fluid  wnl, ant grade 1 plac, cx appears closed, female fetus, FHR=140bpm       A copy of this report including all images has been saved and backed up to  a second source for retrieval if needed. All measures and details of the  anatomical scan, placentation, fluid volume and pelvic anatomy are  contained in that report.  Chari Manning 09/01/2013 10:29 AM        Assessment: Stephanie Cook is  31 y.o. [phone] at [redacted]w[redacted]d presents with Abdominal Cramping .  Plan:  1) abdominal cramping - unclear etiology. Could be braxton hicks vs. Possible indigestion vs. Abdominal cramping from constipation. - SVE unchanged from previous exam in clinic and only occasional mild contractions on the monitor that aren't felt by pt (several in short succession after cervical exam and then one in the last hour of her being here) - gc/chl and wet prep sent - trich from previously cleared.   - labor precautions given - discussed routine bowel care also   2) ?ROM - pooling on exam but in the setting of very recent intercourse.  Sperm on slide without any ferning.  - amnisure neg - reassurance given   3)  Hx of HTN - BP at baseline for the pt in the last couple weeks (130s/80-90s) - PIH labs workup started at [facility] and negative - will send spot prot/cr also- negative- unable to calculate.  - no ha, vision changes, LEE, abd pain - recommend starting back on Aldomet should her bp remain elevated at visit on Wednesday  4) urged the importance of f/u at [facility] as should be doing twice weekly testing amongst routine f/u. Pt with appt on Wednesday and promises to keep that appointment. Will need BP check at that time also.   Kiyo Heal L 10/13/201412:31 AM

## 2013-10-01 ENCOUNTER — Ambulatory Visit (INDEPENDENT_AMBULATORY_CARE_PROVIDER_SITE_OTHER): Payer: Medicaid Other | Admitting: Obstetrics and Gynecology

## 2013-10-01 ENCOUNTER — Encounter: Payer: Self-pay | Admitting: Obstetrics and Gynecology

## 2013-10-01 ENCOUNTER — Other Ambulatory Visit: Payer: Self-pay | Admitting: Obstetrics and Gynecology

## 2013-10-01 ENCOUNTER — Ambulatory Visit (INDEPENDENT_AMBULATORY_CARE_PROVIDER_SITE_OTHER): Payer: Medicaid Other

## 2013-10-01 VITALS — BP 110/70 | Wt 164.0 lb

## 2013-10-01 DIAGNOSIS — O10019 Pre-existing essential hypertension complicating pregnancy, unspecified trimester: Secondary | ICD-10-CM

## 2013-10-01 DIAGNOSIS — Z1389 Encounter for screening for other disorder: Secondary | ICD-10-CM

## 2013-10-01 DIAGNOSIS — O10013 Pre-existing essential hypertension complicating pregnancy, third trimester: Secondary | ICD-10-CM

## 2013-10-01 DIAGNOSIS — O99019 Anemia complicating pregnancy, unspecified trimester: Secondary | ICD-10-CM

## 2013-10-01 DIAGNOSIS — Z3493 Encounter for supervision of normal pregnancy, unspecified, third trimester: Secondary | ICD-10-CM

## 2013-10-01 DIAGNOSIS — O09299 Supervision of pregnancy with other poor reproductive or obstetric history, unspecified trimester: Secondary | ICD-10-CM

## 2013-10-01 DIAGNOSIS — O09219 Supervision of pregnancy with history of pre-term labor, unspecified trimester: Secondary | ICD-10-CM

## 2013-10-01 DIAGNOSIS — Z331 Pregnant state, incidental: Secondary | ICD-10-CM

## 2013-10-01 LAB — POCT URINALYSIS DIPSTICK: Ketones, UA: 3

## 2013-10-01 NOTE — Progress Notes (Signed)
No concern, NOT taking pnv's didn't know they were free, was unwilling to buy, but still smoking 1/3 ppd. Pt agrees to begin pnv's, and begin biweekly visits due to hx chtn. Discussed BC ooptions, pt with lots of ambivalence on nexplanon, opposed to iud(had it x 6 wks,removed)

## 2013-10-01 NOTE — Progress Notes (Signed)
Pt here today for routine visit and [place]. Pt denies any problems or concerns at this time.

## 2013-10-01 NOTE — Progress Notes (Signed)
U/S(32+6wks)-vtx active fetus, BPP 8/8, fluid wnl AFI-10.5cm, ant gr 2 plac, EFW 4 lb 6 oz (39th%tile), UA Doppler RI-0.61 & 0.59

## 2013-10-02 ENCOUNTER — Other Ambulatory Visit: Payer: Medicaid Other | Admitting: Obstetrics & Gynecology

## 2013-10-02 ENCOUNTER — Other Ambulatory Visit: Payer: Medicaid Other

## 2013-10-06 ENCOUNTER — Ambulatory Visit (INDEPENDENT_AMBULATORY_CARE_PROVIDER_SITE_OTHER): Payer: Medicaid Other | Admitting: Obstetrics & Gynecology

## 2013-10-06 VITALS — BP 130/88 | Wt 166.0 lb

## 2013-10-06 DIAGNOSIS — O99019 Anemia complicating pregnancy, unspecified trimester: Secondary | ICD-10-CM

## 2013-10-06 DIAGNOSIS — Z331 Pregnant state, incidental: Secondary | ICD-10-CM

## 2013-10-06 DIAGNOSIS — O10019 Pre-existing essential hypertension complicating pregnancy, unspecified trimester: Secondary | ICD-10-CM

## 2013-10-06 DIAGNOSIS — Z1389 Encounter for screening for other disorder: Secondary | ICD-10-CM

## 2013-10-06 DIAGNOSIS — O09219 Supervision of pregnancy with history of pre-term labor, unspecified trimester: Secondary | ICD-10-CM

## 2013-10-06 DIAGNOSIS — Z8751 Personal history of pre-term labor: Secondary | ICD-10-CM

## 2013-10-06 LAB — POCT URINALYSIS DIPSTICK
Blood, UA: NEGATIVE
Glucose, UA: NEGATIVE
Leukocytes, UA: NEGATIVE
Protein, UA: NEGATIVE

## 2013-10-06 MED ORDER — HYDROXYPROGESTERONE CAPROATE 250 MG/ML IM OIL
250.0000 mg | TOPICAL_OIL | Freq: Once | INTRAMUSCULAR | Status: AC
  Administered 2013-10-06: 250 mg via INTRAMUSCULAR

## 2013-10-06 NOTE — Progress Notes (Signed)
Reactive NST BP weight and urine results all reviewed and noted. Patient reports good fetal movement, denies any bleeding and no rupture of membranes symptoms or regular contractions. Patient is without complaints. All questions were answered.  

## 2013-10-06 NOTE — Progress Notes (Signed)
Pt c/o sharp "needle like" pain on right side and abdominal area. Needs refill on PNV. NST today.

## 2013-10-06 NOTE — Addendum Note (Signed)
Addended by: Criss Alvine on: 10/06/2013 11:52 AM   Modules accepted: Orders

## 2013-10-09 ENCOUNTER — Ambulatory Visit (INDEPENDENT_AMBULATORY_CARE_PROVIDER_SITE_OTHER): Payer: Medicaid Other | Admitting: Obstetrics and Gynecology

## 2013-10-09 ENCOUNTER — Ambulatory Visit (INDEPENDENT_AMBULATORY_CARE_PROVIDER_SITE_OTHER): Payer: Medicaid Other

## 2013-10-09 ENCOUNTER — Other Ambulatory Visit: Payer: Self-pay | Admitting: Obstetrics & Gynecology

## 2013-10-09 VITALS — BP 130/70 | Wt 167.6 lb

## 2013-10-09 DIAGNOSIS — Z1389 Encounter for screening for other disorder: Secondary | ICD-10-CM

## 2013-10-09 DIAGNOSIS — O10019 Pre-existing essential hypertension complicating pregnancy, unspecified trimester: Secondary | ICD-10-CM

## 2013-10-09 DIAGNOSIS — O10013 Pre-existing essential hypertension complicating pregnancy, third trimester: Secondary | ICD-10-CM

## 2013-10-09 DIAGNOSIS — Z331 Pregnant state, incidental: Secondary | ICD-10-CM

## 2013-10-09 DIAGNOSIS — O09219 Supervision of pregnancy with history of pre-term labor, unspecified trimester: Secondary | ICD-10-CM

## 2013-10-09 DIAGNOSIS — O99019 Anemia complicating pregnancy, unspecified trimester: Secondary | ICD-10-CM

## 2013-10-09 DIAGNOSIS — O0993 Supervision of high risk pregnancy, unspecified, third trimester: Secondary | ICD-10-CM

## 2013-10-09 LAB — POCT URINALYSIS DIPSTICK
Blood, UA: NEGATIVE
Glucose, UA: NEGATIVE
Ketones, UA: NEGATIVE
Nitrite, UA: NEGATIVE

## 2013-10-09 MED ORDER — FLINSTONES GUMMIES OMEGA-3 DHA PO CHEW: 1.0000 | CHEWABLE_TABLET | Freq: Every morning | ORAL | Status: DC

## 2013-10-09 NOTE — Progress Notes (Signed)
U/S(34+0wks)-vtx active fetus BPP 8/8, fluid wnl AFI-8.6cm, anterior Gr 2 plac, UA Doppler RI-0.57 & 0.59

## 2013-10-09 NOTE — Progress Notes (Signed)
Pt here today for BPP. PT NEEDS REFILL ON PNV.

## 2013-10-09 NOTE — Progress Notes (Signed)
BPP 8/8.  Good FM. Not currently on BP meds. Stopped Aldomet when bp's normalized.

## 2013-10-13 ENCOUNTER — Ambulatory Visit (INDEPENDENT_AMBULATORY_CARE_PROVIDER_SITE_OTHER): Payer: Medicaid Other | Admitting: Women's Health

## 2013-10-13 ENCOUNTER — Encounter: Payer: Self-pay | Admitting: Women's Health

## 2013-10-13 VITALS — BP 140/90 | Wt 167.0 lb

## 2013-10-13 DIAGNOSIS — O10019 Pre-existing essential hypertension complicating pregnancy, unspecified trimester: Secondary | ICD-10-CM

## 2013-10-13 DIAGNOSIS — O09219 Supervision of pregnancy with history of pre-term labor, unspecified trimester: Secondary | ICD-10-CM

## 2013-10-13 DIAGNOSIS — O99013 Anemia complicating pregnancy, third trimester: Secondary | ICD-10-CM

## 2013-10-13 DIAGNOSIS — Z331 Pregnant state, incidental: Secondary | ICD-10-CM

## 2013-10-13 DIAGNOSIS — Z1389 Encounter for screening for other disorder: Secondary | ICD-10-CM

## 2013-10-13 DIAGNOSIS — R768 Other specified abnormal immunological findings in serum: Secondary | ICD-10-CM

## 2013-10-13 DIAGNOSIS — O4703 False labor before 37 completed weeks of gestation, third trimester: Secondary | ICD-10-CM

## 2013-10-13 DIAGNOSIS — B009 Herpesviral infection, unspecified: Secondary | ICD-10-CM

## 2013-10-13 DIAGNOSIS — O99019 Anemia complicating pregnancy, unspecified trimester: Secondary | ICD-10-CM

## 2013-10-13 DIAGNOSIS — O0993 Supervision of high risk pregnancy, unspecified, third trimester: Secondary | ICD-10-CM

## 2013-10-13 DIAGNOSIS — O10913 Unspecified pre-existing hypertension complicating pregnancy, third trimester: Secondary | ICD-10-CM

## 2013-10-13 LAB — POCT URINALYSIS DIPSTICK: Glucose, UA: NEGATIVE

## 2013-10-13 LAB — OB RESULTS CONSOLE GC/CHLAMYDIA
Chlamydia: NEGATIVE
Gonorrhea: NEGATIVE

## 2013-10-13 MED ORDER — ACYCLOVIR 400 MG PO TABS: 400.0000 mg | ORAL_TABLET | Freq: Three times a day (TID) | ORAL | Status: DC

## 2013-10-13 MED ORDER — FERROUS SULFATE 325 (65 FE) MG PO TABS: 325.0000 mg | ORAL_TABLET | Freq: Two times a day (BID) | ORAL | Status: DC

## 2013-10-13 NOTE — Patient Instructions (Addendum)
Iron-Rich Diet An iron-rich diet contains foods that are good sources of iron. Iron is an important mineral that helps your body produce hemoglobin. Hemoglobin is a protein in red blood cells that carries oxygen to the body's tissues. Sometimes, the iron level in your blood can be low. This may be caused by:  A lack of iron in your diet.  Blood loss.  Times of growth, such as during pregnancy or during a child's growth and development. Low levels of iron can cause a decrease in the number of red blood cells. This can result in iron deficiency anemia. Iron deficiency anemia symptoms include:  Tiredness.  Weakness.  Irritability.  Increased chance of infection. Here are some recommendations for daily iron intake:  Males older than 25 years of age need 8 mg of iron per day.  Women ages 27 to 68 need 18 mg of iron per day.  Pregnant women need 27 mg of iron per day, and women who are over 30 years of age and breastfeeding need 9 mg of iron per day.  Women over the age of 38 need 8 mg of iron per day. SOURCES OF IRON There are 2 types of iron that are found in food: heme iron and nonheme iron. Heme iron is absorbed by the body better than nonheme iron. Heme iron is found in meat, poultry, and fish. Nonheme iron is found in grains, beans, and vegetables. Heme Iron Sources Food / Iron (mg)  Chicken liver, 3 oz (85 g)/ 10 mg  Beef liver, 3 oz (85 g)/ 5.5 mg  Oysters, 3 oz (85 g)/ 8 mg  Beef, 3 oz (85 g)/ 2 to 3 mg  Shrimp, 3 oz (85 g)/ 2.8 mg  [place], 3 oz (85 g)/ 2 mg  Chicken, 3 oz (85 g) / 1 mg  Fish (tuna, halibut), 3 oz (85 g)/ 1 mg  Pork, 3 oz (85 g)/ 0.9 mg Nonheme Iron Sources Food / Iron (mg)  Ready-to-eat breakfast cereal, iron-fortified / 3.9 to 7 mg  Tofu,  cup / 3.4 mg  Kidney beans,  cup / 2.6 mg  Baked potato with skin / 2.7 mg  Asparagus,  cup / 2.2 mg  Avocado / 2 mg  Dried peaches,  cup / 1.6 mg  Raisins,  cup / 1.5 mg  Soy milk, 1 cup  / 1.5 mg  Whole-wheat bread, 1 slice / 1.2 mg  Spinach, 1 cup / 0.8 mg  Broccoli,  cup / 0.6 mg IRON ABSORPTION Certain foods can decrease the body's absorption of iron. Try to avoid these foods and beverages while eating meals with iron-containing foods:  Coffee.  Tea.  Fiber.  Soy. Foods containing vitamin C can help increase the amount of iron your body absorbs from iron sources, especially from nonheme sources. Eat foods with vitamin C along with iron-containing foods to increase your iron absorption. Foods that are high in vitamin C include many fruits and vegetables. Some good sources are:  Fresh orange juice.  Oranges.  Strawberries.  Mangoes.  Grapefruit.  Red bell peppers.  Green bell peppers.  Broccoli.  Potatoes with skin.  Tomato juice. Document Released: 07/18/2005 Document Revised: 02/26/2012 Document Reviewed: 05/25/2011 [facility] Patient Information 2014 [place], [place].  Preterm Labor Preterm labor is when labor starts at less than 37 weeks of pregnancy. The normal length of a pregnancy is 39 to 41 weeks. CAUSES Often, there is no identifiable underlying cause as to why a woman goes into preterm labor. However,  one of the most common known causes of preterm labor is infection. Infections of the uterus, cervix, vagina, amniotic sac, bladder, kidney, or even the lungs (pneumonia) can cause labor to start. Other causes of preterm labor include:  Urogenital infections, such as yeast infections and bacterial vaginosis.  Uterine abnormalities (uterine shape, uterine septum, fibroids, bleeding from the placenta).  A cervix that has been operated on and opens prematurely.  Malformations in the baby.  Multiple gestations (twins, triplets, and so on).  Breakage of the amniotic sac. Additional risk factors for preterm labor include:  Previous history of preterm labor.  Premature rupture of membranes (PROM).  A placenta that covers the opening of  the cervix (placenta previa).  A placenta that separates from the uterus (placenta abruption).  A cervix that is too weak to hold the baby in the uterus (incompetence cervix).  Having too much fluid in the amniotic sac (polyhydramnios).  Taking illegal drugs or smoking while pregnant.  Not gaining enough weight while pregnant.  Women younger than 73 and older than 25 years old.  Low socioeconomic status.  African-American ethnicity. SYMPTOMS Signs and symptoms of preterm labor include:  Menstrual-like cramps.  Contractions that are 30 to 70 seconds apart, become very regular, closer together, and are more intense and painful.  Contractions that start on the top of the uterus and spread down to the lower abdomen and back.  A sense of increased pelvic pressure or back pain.  A watery or bloody discharge that comes from the vagina. DIAGNOSIS  A diagnosis can be confirmed by:  A vaginal exam.  An ultrasound of the cervix.  Sampling (swabbing) cervico-vaginal secretions. These samples can be tested for the presence of fetal fibronectin. This is a protein found in cervical discharge which is associated with preterm labor.  Fetal monitoring. TREATMENT  Depending on the length of the pregnancy and other circumstances, a caregiver may suggest bed rest. If necessary, there are medicines that can be given to stop contractions and to quicken fetal lung maturity. If labor happens before 34 weeks of pregnancy, a prolonged hospital stay may be recommended. Treatment depends on the condition of both the mother and baby. PREVENTION There are some things a mother can do to lower the risk of preterm labor in future pregnancies. A woman can:   Stop smoking.  Maintain healthy weight gain and avoid chemicals and drugs that are not necessary.  Be watchful for any type of infection.  Inform her caregiver if she has a known history of preterm labor. Document Released: 02/24/2004 Document  Revised: 02/26/2012 Document Reviewed: 03/31/2011 [facility] Patient Information 2014 [place], [place].

## 2013-10-13 NOTE — Progress Notes (Signed)
Reports good fm. Denies lof, vb, urinary urgency, hesitancy, or dysuria.  Had regular uc's accompanied by urinary frequency last night, both have resolved. Reports she is still coming weekly for 17P injections. Encouraged po hydration, rest, pelvic rest. Not taking bp meds, had stopped earlier in pregnancy.  Will see what bp is on Thursday.  Reviewed ptl s/s, fkc.  All questions answered. Rx ferrous sulfate and acyclovir for hsv2 suppression. Info given on fe-rich foods. F/U in 3d for u/s and visit.

## 2013-10-14 LAB — GC/CHLAMYDIA PROBE AMP
CT Probe RNA: NEGATIVE
GC Probe RNA: NEGATIVE

## 2013-10-16 ENCOUNTER — Encounter: Payer: Self-pay | Admitting: Obstetrics & Gynecology

## 2013-10-16 ENCOUNTER — Other Ambulatory Visit: Payer: Self-pay | Admitting: Obstetrics & Gynecology

## 2013-10-16 ENCOUNTER — Ambulatory Visit (INDEPENDENT_AMBULATORY_CARE_PROVIDER_SITE_OTHER): Payer: Medicaid Other

## 2013-10-16 ENCOUNTER — Ambulatory Visit (INDEPENDENT_AMBULATORY_CARE_PROVIDER_SITE_OTHER): Payer: Medicaid Other | Admitting: Obstetrics & Gynecology

## 2013-10-16 VITALS — BP 130/80 | Wt 164.0 lb

## 2013-10-16 DIAGNOSIS — O10019 Pre-existing essential hypertension complicating pregnancy, unspecified trimester: Secondary | ICD-10-CM

## 2013-10-16 DIAGNOSIS — O10013 Pre-existing essential hypertension complicating pregnancy, third trimester: Secondary | ICD-10-CM

## 2013-10-16 DIAGNOSIS — Z331 Pregnant state, incidental: Secondary | ICD-10-CM

## 2013-10-16 DIAGNOSIS — O10913 Unspecified pre-existing hypertension complicating pregnancy, third trimester: Secondary | ICD-10-CM

## 2013-10-16 DIAGNOSIS — Z1389 Encounter for screening for other disorder: Secondary | ICD-10-CM

## 2013-10-16 LAB — POCT URINALYSIS DIPSTICK
Blood, UA: NEGATIVE
Glucose, UA: NEGATIVE
Nitrite, UA: NEGATIVE

## 2013-10-16 NOTE — Progress Notes (Signed)
Sonogram and reviewed and report done. BP weight and urine results all reviewed and noted. Patient reports good fetal movement, denies any bleeding and no rupture of membranes symptoms or regular contractions. Patient is without complaints. All questions were answered.

## 2013-10-16 NOTE — Progress Notes (Signed)
U/S(35+0wks)-vtx active fetus BPP 8/8, fluid wnl AFI-10.5cm, ant gr2 placenta, UA Doppler RI-0.58 & 0.48, EFw 5 lb 6 oz (37th%tile), female fetus

## 2013-10-16 NOTE — Addendum Note (Signed)
Addended by: Colen Darling on: 10/16/2013 02:08 PM   Modules accepted: Orders

## 2013-10-20 ENCOUNTER — Ambulatory Visit (INDEPENDENT_AMBULATORY_CARE_PROVIDER_SITE_OTHER): Payer: Medicaid Other | Admitting: Obstetrics & Gynecology

## 2013-10-20 ENCOUNTER — Encounter: Payer: Self-pay | Admitting: Obstetrics & Gynecology

## 2013-10-20 VITALS — BP 140/90 | Wt 166.0 lb

## 2013-10-20 DIAGNOSIS — O10013 Pre-existing essential hypertension complicating pregnancy, third trimester: Secondary | ICD-10-CM

## 2013-10-20 DIAGNOSIS — Z331 Pregnant state, incidental: Secondary | ICD-10-CM

## 2013-10-20 DIAGNOSIS — Z1389 Encounter for screening for other disorder: Secondary | ICD-10-CM

## 2013-10-20 DIAGNOSIS — O10019 Pre-existing essential hypertension complicating pregnancy, unspecified trimester: Secondary | ICD-10-CM

## 2013-10-20 LAB — POCT URINALYSIS DIPSTICK
Blood, UA: NEGATIVE
Protein, UA: NEGATIVE

## 2013-10-20 MED ORDER — METHYLDOPA 250 MG PO TABS: ORAL_TABLET | ORAL | Status: DC

## 2013-10-20 NOTE — Addendum Note (Signed)
Addended by: Richardson Chiquito on: 10/20/2013 04:27 PM   Modules accepted: Orders

## 2013-10-20 NOTE — Progress Notes (Signed)
Reactive NST BP weight and urine results all reviewed and noted. Patient reports good fetal movement, denies any bleeding and no rupture of membranes symptoms or regular contractions. Patient is without complaints. All questions were answered. Start 1/2 tablet twice a day(250 mg BID)

## 2013-10-23 ENCOUNTER — Encounter: Payer: Self-pay | Admitting: Obstetrics & Gynecology

## 2013-10-23 ENCOUNTER — Ambulatory Visit (INDEPENDENT_AMBULATORY_CARE_PROVIDER_SITE_OTHER): Payer: Medicaid Other

## 2013-10-23 ENCOUNTER — Other Ambulatory Visit: Payer: Self-pay | Admitting: Obstetrics & Gynecology

## 2013-10-23 ENCOUNTER — Ambulatory Visit (INDEPENDENT_AMBULATORY_CARE_PROVIDER_SITE_OTHER): Payer: Medicaid Other | Admitting: Obstetrics & Gynecology

## 2013-10-23 ENCOUNTER — Other Ambulatory Visit: Payer: Self-pay

## 2013-10-23 VITALS — BP 140/90 | Wt 166.0 lb

## 2013-10-23 DIAGNOSIS — O09219 Supervision of pregnancy with history of pre-term labor, unspecified trimester: Secondary | ICD-10-CM

## 2013-10-23 DIAGNOSIS — Z1389 Encounter for screening for other disorder: Secondary | ICD-10-CM

## 2013-10-23 DIAGNOSIS — Z331 Pregnant state, incidental: Secondary | ICD-10-CM

## 2013-10-23 DIAGNOSIS — O10019 Pre-existing essential hypertension complicating pregnancy, unspecified trimester: Secondary | ICD-10-CM

## 2013-10-23 DIAGNOSIS — O10013 Pre-existing essential hypertension complicating pregnancy, third trimester: Secondary | ICD-10-CM

## 2013-10-23 DIAGNOSIS — O99019 Anemia complicating pregnancy, unspecified trimester: Secondary | ICD-10-CM

## 2013-10-23 DIAGNOSIS — O98519 Other viral diseases complicating pregnancy, unspecified trimester: Secondary | ICD-10-CM

## 2013-10-23 LAB — POCT URINALYSIS DIPSTICK
Ketones, UA: NEGATIVE
Leukocytes, UA: NEGATIVE
Protein, UA: NEGATIVE

## 2013-10-23 MED ORDER — HYDROXYPROGESTERONE CAPROATE 250 MG/ML IM OIL
250.0000 mg | TOPICAL_OIL | Freq: Once | INTRAMUSCULAR | Status: AC
  Administered 2013-10-23: 250 mg via INTRAMUSCULAR

## 2013-10-23 NOTE — Progress Notes (Signed)
Sonogram reviewed see report.   Continue twice weekly fetl assessments BP weight and urine results all reviewed and noted. Patient reports good fetal movement, denies any bleeding and no rupture of membranes symptoms or regular contractions. Patient is without complaints. All questions were answered.

## 2013-10-23 NOTE — Addendum Note (Signed)
Addended by: Colen Darling on: 10/23/2013 02:41 PM   Modules accepted: Orders

## 2013-10-23 NOTE — Progress Notes (Signed)
U/S(36+0wks)-vtx active fetus, BPP 8/8, fluid wnl AFI-9.7cm, anterior Gr 3 plac, UA Doppler RI-0.60 x2, FHR-136 bpm, female fetus

## 2013-10-25 ENCOUNTER — Inpatient Hospital Stay (HOSPITAL_COMMUNITY): Payer: Medicaid Other | Admitting: Anesthesiology

## 2013-10-25 ENCOUNTER — Inpatient Hospital Stay (HOSPITAL_COMMUNITY)
Admission: AD | Admit: 2013-10-25 | Discharge: 2013-10-28 | DRG: 774 | Disposition: A | Discharge status: Home / Self Care | Payer: Medicaid Other | Source: Ambulatory Visit | Attending: Obstetrics & Gynecology | Admitting: Obstetrics & Gynecology

## 2013-10-25 ENCOUNTER — Encounter (HOSPITAL_COMMUNITY): Payer: Medicaid Other | Admitting: Anesthesiology

## 2013-10-25 ENCOUNTER — Encounter (HOSPITAL_COMMUNITY): Payer: Self-pay | Admitting: *Deleted

## 2013-10-25 DIAGNOSIS — O99334 Smoking (tobacco) complicating childbirth: Secondary | ICD-10-CM | POA: Diagnosis present

## 2013-10-25 DIAGNOSIS — O99012 Anemia complicating pregnancy, second trimester: Secondary | ICD-10-CM

## 2013-10-25 DIAGNOSIS — B009 Herpesviral infection, unspecified: Secondary | ICD-10-CM

## 2013-10-25 DIAGNOSIS — O10013 Pre-existing essential hypertension complicating pregnancy, third trimester: Secondary | ICD-10-CM

## 2013-10-25 DIAGNOSIS — O1002 Pre-existing essential hypertension complicating childbirth: Principal | ICD-10-CM | POA: Diagnosis present

## 2013-10-25 LAB — COMPREHENSIVE METABOLIC PANEL
AST: 21 U/L (ref 0–37)
Albumin: 2.7 g/dL — ABNORMAL LOW (ref 3.5–5.2)
BUN: 4 mg/dL — ABNORMAL LOW (ref 6–23)
Calcium: 8.8 mg/dL (ref 8.4–10.5)
Chloride: 104 mEq/L (ref 96–112)
Creatinine, Ser: 0.48 mg/dL — ABNORMAL LOW (ref 0.50–1.10)
GFR calc non Af Amer: >90 mL/min (ref >90)
Potassium: 3.6 mEq/L (ref 3.5–5.1)
Total Bilirubin: 0.5 mg/dL (ref 0.3–1.2)
Total Protein: 6.7 g/dL (ref 6.0–8.3)

## 2013-10-25 LAB — OB RESULTS CONSOLE GBS: GBS: NEGATIVE

## 2013-10-25 LAB — CBC
Platelets: 234 10*3/uL (ref 150–400)
RBC: 3.82 MIL/uL — ABNORMAL LOW (ref 3.87–5.11)
RDW: 14.2 % (ref 11.5–15.5)
WBC: 11.7 10*3/uL — ABNORMAL HIGH (ref 4.0–10.5)

## 2013-10-25 LAB — URINALYSIS, ROUTINE W REFLEX MICROSCOPIC
Bilirubin Urine: NEGATIVE
Glucose, UA: NEGATIVE mg/dL
Ketones, ur: NEGATIVE mg/dL
Leukocytes, UA: NEGATIVE
Nitrite: NEGATIVE
Protein, ur: NEGATIVE mg/dL
Specific Gravity, Urine: 1.01 (ref 1.005–1.030)
pH: 6 (ref 5.0–8.0)

## 2013-10-25 LAB — GROUP B STREP BY PCR: Group B strep by PCR: NEGATIVE

## 2013-10-25 MED ORDER — SODIUM CHLORIDE 0.9 % IV SOLN
2.0000 g | Freq: Once | INTRAVENOUS | Status: AC
  Administered 2013-10-25: 2 g via INTRAVENOUS
  Filled 2013-10-25: qty 2000

## 2013-10-25 MED ORDER — EPHEDRINE 5 MG/ML INJ
10.0000 mg | INTRAVENOUS | Status: DC | PRN
  Filled 2013-10-25: qty 4
  Filled 2013-10-25: qty 2

## 2013-10-25 MED ORDER — LACTATED RINGERS IV SOLN: 500.0000 mL | INTRAVENOUS | Status: DC | PRN

## 2013-10-25 MED ORDER — ONDANSETRON HCL 4 MG/2ML IJ SOLN: 4.0000 mg | Freq: Four times a day (QID) | INTRAMUSCULAR | Status: DC | PRN

## 2013-10-25 MED ORDER — FENTANYL 2.5 MCG/ML BUPIVACAINE 1/10 % EPIDURAL INFUSION (WH - ANES)
14.0000 mL/h | INTRAMUSCULAR | Status: DC | PRN
  Administered 2013-10-26: 14 mL/h via EPIDURAL
  Filled 2013-10-25 (×2): qty 125

## 2013-10-25 MED ORDER — PHENYLEPHRINE 40 MCG/ML (10ML) SYRINGE FOR IV PUSH (FOR BLOOD PRESSURE SUPPORT)
80.0000 ug | PREFILLED_SYRINGE | INTRAVENOUS | Status: DC | PRN
  Filled 2013-10-25: qty 2

## 2013-10-25 MED ORDER — OXYTOCIN 40 UNITS IN LACTATED RINGERS INFUSION - SIMPLE MED
62.5000 mL/h | INTRAVENOUS | Status: DC
  Filled 2013-10-25: qty 1000

## 2013-10-25 MED ORDER — LIDOCAINE HCL (PF) 1 % IJ SOLN
30.0000 mL | INTRAMUSCULAR | Status: DC | PRN
  Filled 2013-10-25 (×2): qty 30

## 2013-10-25 MED ORDER — LACTATED RINGERS IV SOLN
INTRAVENOUS | Status: DC
  Administered 2013-10-25: 125 mL/h via INTRAVENOUS

## 2013-10-25 MED ORDER — ACYCLOVIR 400 MG PO TABS
400.0000 mg | ORAL_TABLET | Freq: Three times a day (TID) | ORAL | Status: DC
  Administered 2013-10-25: 400 mg via ORAL
  Filled 2013-10-25 (×5): qty 1

## 2013-10-25 MED ORDER — LACTATED RINGERS IV SOLN: 500.0000 mL | Freq: Once | INTRAVENOUS | Status: DC

## 2013-10-25 MED ORDER — OXYTOCIN BOLUS FROM INFUSION
500.0000 mL | INTRAVENOUS | Status: DC
  Administered 2013-10-26: 500 mL via INTRAVENOUS

## 2013-10-25 MED ORDER — OXYCODONE-ACETAMINOPHEN 5-325 MG PO TABS: 1.0000 | ORAL_TABLET | ORAL | Status: DC | PRN

## 2013-10-25 MED ORDER — PHENYLEPHRINE 40 MCG/ML (10ML) SYRINGE FOR IV PUSH (FOR BLOOD PRESSURE SUPPORT)
80.0000 ug | PREFILLED_SYRINGE | INTRAVENOUS | Status: DC | PRN
  Filled 2013-10-25: qty 2
  Filled 2013-10-25: qty 10

## 2013-10-25 MED ORDER — EPHEDRINE 5 MG/ML INJ
10.0000 mg | INTRAVENOUS | Status: DC | PRN
  Filled 2013-10-25: qty 2

## 2013-10-25 MED ORDER — CITRIC ACID-SODIUM CITRATE 334-500 MG/5ML PO SOLN: 30.0000 mL | ORAL | Status: DC | PRN

## 2013-10-25 MED ORDER — FENTANYL 2.5 MCG/ML BUPIVACAINE 1/10 % EPIDURAL INFUSION (WH - ANES)
INTRAMUSCULAR | Status: DC | PRN
  Administered 2013-10-25: 14 mL/h via EPIDURAL

## 2013-10-25 MED ORDER — FLEET ENEMA 7-19 GM/118ML RE ENEM: 1.0000 | ENEMA | Freq: Once | RECTAL | Status: DC

## 2013-10-25 MED ORDER — ACETAMINOPHEN 325 MG PO TABS: 650.0000 mg | ORAL_TABLET | ORAL | Status: DC | PRN

## 2013-10-25 MED ORDER — LIDOCAINE HCL (PF) 1 % IJ SOLN
INTRAMUSCULAR | Status: DC | PRN
  Administered 2013-10-25: 3 mL
  Administered 2013-10-25 (×2): 5 mL

## 2013-10-25 MED ORDER — IBUPROFEN 600 MG PO TABS: 600.0000 mg | ORAL_TABLET | Freq: Four times a day (QID) | ORAL | Status: DC | PRN

## 2013-10-25 MED ORDER — DIPHENHYDRAMINE HCL 50 MG/ML IJ SOLN: 12.5000 mg | INTRAMUSCULAR | Status: DC | PRN

## 2013-10-25 NOTE — MAU Note (Signed)
Off EFM to void and then to [facility].

## 2013-10-25 NOTE — Anesthesia Procedure Notes (Signed)
Epidural Patient location during procedure: OB  Staffing Anesthesiologist: Manuella Blackson Performed by: anesthesiologist   Preanesthetic Checklist Completed: patient identified, site marked, surgical consent, pre-op evaluation, timeout performed, IV checked, risks and benefits discussed and monitors and equipment checked  Epidural Patient position: sitting Prep: ChloraPrep Patient monitoring: heart rate, continuous pulse ox and blood pressure Approach: right paramedian Injection technique: LOR saline  Needle:  Needle type: Tuohy  Needle gauge: 17 G Needle length: 9 cm and 9 Needle insertion depth: 6 cm Catheter type: closed end flexible Catheter size: 20 Guage Catheter at skin depth: 12 cm Test dose: negative  Assessment Events: blood not aspirated, injection not painful, no injection resistance, negative IV test and no paresthesia  Additional Notes   Patient tolerated the insertion well without complications.   

## 2013-10-25 NOTE — Progress Notes (Signed)
Patient ID: PARISA PINELA, female   DOB: 03-09-1986, 27 y.o.   MRN: [id number]  GBS negative, will stop antibiotics.  Results for orders placed during the hospital encounter of 10/25/13 (from the past 24 hour(s))  OB RESULTS CONSOLE GBS     Status: None   Collection Time    10/25/13 12:00 AM      Result Value Range   GBS Negative    URINALYSIS, ROUTINE W REFLEX MICROSCOPIC     Status: None   Collection Time    10/25/13  2:48 PM      Result Value Range   Color, Urine YELLOW  YELLOW   APPearance CLEAR  CLEAR   Specific Gravity, Urine 1.010  1.005 - 1.030   pH 6.0  5.0 - 8.0   Glucose, UA NEGATIVE  NEGATIVE mg/dL   Hgb urine dipstick NEGATIVE  NEGATIVE   Bilirubin Urine NEGATIVE  NEGATIVE   Ketones, ur NEGATIVE  NEGATIVE mg/dL   Protein, ur NEGATIVE  NEGATIVE mg/dL   Urobilinogen, UA 0.2  0.0 - 1.0 mg/dL   Nitrite NEGATIVE  NEGATIVE   Leukocytes, UA NEGATIVE  NEGATIVE  CBC     Status: Abnormal   Collection Time    10/25/13  5:15 PM      Result Value Range   WBC 11.7 (*) 4.0 - 10.5 K/uL   RBC 3.82 (*) 3.87 - 5.11 MIL/uL   Hemoglobin 10.5 (*) 12.0 - 15.0 g/dL   HCT 40.9 (*) 81.1 - 91.4 %   MCV 82.5  78.0 - 100.0 fL   MCH 27.5  26.0 - 34.0 pg   MCHC 33.3  30.0 - 36.0 g/dL   RDW 78.2  95.6 - 21.3 %   Platelets 234  150 - 400 K/uL  COMPREHENSIVE METABOLIC PANEL     Status: Abnormal   Collection Time    10/25/13  5:15 PM      Result Value Range   Sodium 136  135 - 145 mEq/L   Potassium 3.6  3.5 - 5.1 mEq/L   Chloride 104  96 - 112 mEq/L   CO2 21  19 - 32 mEq/L   Glucose, Bld 81  70 - 99 mg/dL   BUN 4 (*) 6 - 23 mg/dL   Creatinine, Ser 0.86 (*) 0.50 - 1.10 mg/dL   Calcium 8.8  8.4 - 57.8 mg/dL   Total Protein 6.7  6.0 - 8.3 g/dL   Albumin 2.7 (*) 3.5 - 5.2 g/dL   AST 21  0 - 37 U/L   ALT 21  0 - 35 U/L   Alkaline Phosphatase 153 (*) 39 - 117 U/L   Total Bilirubin 0.5  0.3 - 1.2 mg/dL   GFR calc non Af Amer >90  >90 mL/min   GFR calc Af Amer >90  >90 mL/min  GROUP  B STREP BY PCR     Status: None   Collection Time    10/25/13  6:04 PM      Result Value Range   Group B strep by PCR NEGATIVE  NEGATIVE   Filed Vitals:   10/25/13 2025 10/25/13 2027 10/25/13 2029 10/25/13 2033  BP: 137/76   141/67  Pulse: 122 90 105 109  Temp:    98.1 F (36.7 C)  TempSrc:    Axillary  Resp: 16   16  Height:      Weight:      SpO2: 99%  98%    UCs every 8 minutes FHR  reactive.  Will continue to observe

## 2013-10-25 NOTE — MAU Note (Signed)
Patient presents via [facility] stretcher complaining of irregular contractions since 1100 today.

## 2013-10-25 NOTE — Anesthesia Preprocedure Evaluation (Signed)
Anesthesia Evaluation  Patient identified by MRN, date of birth, ID band Patient awake    Reviewed: Allergy & Precautions, H&P , NPO status , Patient's Chart, lab work & pertinent test results  History of Anesthesia Complications Negative for: history of anesthetic complications  Airway Mallampati: II TM Distance: >3 FB Neck ROM: full    Dental no notable dental hx. (+) Teeth Intact   Pulmonary neg pulmonary ROS, Current Smoker,  breath sounds clear to auscultation  Pulmonary exam normal       Cardiovascular hypertension, Pt. on medications negative cardio ROS  Rhythm:regular Rate:Normal     Neuro/Psych negative neurological ROS  negative psych ROS   GI/Hepatic negative GI ROS, Neg liver ROS,   Endo/Other  negative endocrine ROS  Renal/GU negative Renal ROS  negative genitourinary   Musculoskeletal   Abdominal Normal abdominal exam  (+)   Peds  Hematology negative hematology ROS (+)   Anesthesia Other Findings   Reproductive/Obstetrics (+) Pregnancy                           Anesthesia Physical Anesthesia Plan  ASA: II  Anesthesia Plan: Epidural   Post-op Pain Management:    Induction:   Airway Management Planned:   Additional Equipment:   Intra-op Plan:   Post-operative Plan:   Informed Consent: I have reviewed the patients History and Physical, chart, labs and discussed the procedure including the risks, benefits and alternatives for the proposed anesthesia with the patient or authorized representative who has indicated his/her understanding and acceptance.     Plan Discussed with:   Anesthesia Plan Comments:         Anesthesia Quick Evaluation

## 2013-10-25 NOTE — H&P (Signed)
Stephanie Cook is a 27 y.o. female [phone] presenting for active labor at 67w2. Per pt, noted onset of irreg contractions since 11am today. Had several within a 10 minute period initially lasting several seconds but lengthening in duration. Also attesting to temporal headache intermittent described as pulsing not assc with photophobia or phonophobia, n/v. Does attest to intermittent RUQ pain with no aggravating factors. Not related to ambulation or food intake. Has hx of heartburn, chest tightness and feelings of retching, worse after eating food.  Denies SOB. Denies vag bleeding, LOF, vaginal discharge. Still with good fetal movement.   PNC at [facility] since 1st trimester. Neg genetic screen. Normal anatominc [place]. Nml 2 hr glucose screen (84/174/114). Desires to breast feed and use nexplanon for contraception. Hx of HSV-2, on suppression since 34 weeks, reports 100% compliance. Also with hx of cHTN, prior to preg was on norvasc 10mg  daily and hctz 12.5 mg daily. Was switched to aldomet 250mg  BID. Has been on 17p given hx of PTB at 33weeks. Had post [facility] on 4/14.  Maternal Medical History:  Reason for admission: Nausea.     OB History   Grav Para Term Preterm Abortions TAB SAB Ect Mult Living   5 3 2 1 1  0 1 0 0 3     1. Term, NSVD 37w0 M 2. Term NSVD 37w0 F 3. Preterm NSVD 33w0 F 4. SAB 5. Current  Past Medical History  Diagnosis Date  . Hypertension   . Preterm labor   . STD (female)     chlamydia, trichomonas, gonorrhea, HPV, HSV  . Pregnant   . Hx of trichomoniasis     recurrent, 4 times  . Hx of chlamydia infection     multiple times  . Hx of gonorrhea   . HSV-2 (herpes simplex virus 2) infection   . Depression    Past Surgical History  Procedure Laterality Date  . No past surgeries     Family History: family history includes Hypertension in her other; Mental illness in her other. Social History:  reports that she has been smoking Cigarettes.  She has been smoking about 0.00  packs per day. She has never used smokeless tobacco. She reports that she drinks alcohol. She reports that she uses illicit drugs (Marijuana).   Prenatal Transfer Tool  Maternal Diabetes: No Genetic Screening: Normal Maternal Ultrasounds/Referrals: Normal Fetal Ultrasounds or other Referrals:  None Maternal Substance Abuse:  Yes:  Type: Marijuana post 4/14 Significant Maternal Medications:  Meds include: Other: aldomet, acyclovir Significant Maternal Lab Results:  Lab values include: Other: GBS unknown Other Comments:  None  Review of Systems  Constitutional: Negative for fever and chills.  HENT: Negative for congestion and sore throat.   Eyes: Negative for blurred vision and double vision.  Respiratory: Negative for cough, shortness of breath and wheezing.   Cardiovascular: Negative for chest pain, palpitations and leg swelling.  Gastrointestinal: Positive for heartburn, nausea and abdominal pain. Negative for vomiting, diarrhea and constipation.  Genitourinary: Negative for dysuria, urgency and frequency.  Musculoskeletal: Negative for back pain and myalgias.  Skin: Negative for rash.  Neurological: Positive for headaches. Negative for dizziness, seizures and loss of consciousness.  Endo/Heme/Allergies: Does not bruise/bleed easily.  Psychiatric/Behavioral: Negative for depression.    Dilation: 5 Effacement (%): 70 Station: -2 Exam by:: Dellie Burns, RN BSN Blood pressure 129/84, pulse 77, temperature 98 F (36.7 C), temperature source Oral, resp. rate 18, height 5\' 3"  (1.6 m), weight 75.297 kg (166 lb), last  menstrual period 01/22/2013. Exam Physical Exam  Constitutional: She is oriented to person, place, and time. She appears well-developed and well-nourished. No distress.  HENT:  Head: Normocephalic and atraumatic.  Eyes: EOM are normal.  Neck: Normal range of motion. Neck supple.  Cardiovascular: Normal rate and regular rhythm.   No murmur heard. Respiratory:  Effort normal and breath sounds normal. No respiratory distress.  GI: Soft. Bowel sounds are normal. She exhibits no distension (gravid). There is no tenderness. There is no rebound.  Musculoskeletal: Normal range of motion. She exhibits no edema.  Neurological: She is alert and oriented to person, place, and time. She has normal reflexes.  Skin: Skin is warm and dry. No erythema.  Psychiatric: She has a normal mood and affect. Her behavior is normal.    Prenatal labs: ABO, Rh: --/--/B POS (07/11 0013) Antibody: NEG (09/15 0910) Rubella: 2.22 (04/01 1230) RPR: NON REAC (09/15 0910)  HBsAg: NEGATIVE (04/01 1230)  HIV: NON REACTIVE (09/15 0910)  GBS:     FHR- baseline 135, mod var, post accel, variable decel UC- every 3-4min  Assessment/Plan: Pt is a 27 y.o [id number] who presents at 36w2 in active labor 1. Active labor- will not augment given preterm, making cervical change on own -routine orders -HIV NR, GBS unknown- will obtain pcr, starting amp for prophylaxis -pain control with epidural -expect NSVD -continue valtrex  2. cHTN- BPs- systolic 125-146/diastolic 80-102, on aldomet BID, attesting to some recent onset headaches and RUQ discomfort  -obtain PIH labs -will hold on tx with mag at this time -cont aldomet at home dose  3. FHR- will cont to monitor for recurrent variables -cat II tracing   4. Postpartum-  -desires to breast feed -would like to use nexplanon for contraception -f/up in [facility] in 4-6 weeks  Anselm Lis 10/25/2013, 6:04 PM  I have seen and examined this patient and agree with above documentation in the resident's note. GBS PCR neg. Stop ampicillin. No severe range pressures.  Wait for [facility] labs. Pt admits to not taking aldomet regularly.    Rulon Abide, M.D. [facility] Fellow 10/25/2013 8:11 PM

## 2013-10-26 ENCOUNTER — Encounter (HOSPITAL_COMMUNITY): Payer: Self-pay | Admitting: *Deleted

## 2013-10-26 DIAGNOSIS — O1002 Pre-existing essential hypertension complicating childbirth: Secondary | ICD-10-CM

## 2013-10-26 DIAGNOSIS — O99334 Smoking (tobacco) complicating childbirth: Secondary | ICD-10-CM

## 2013-10-26 LAB — RPR: RPR Ser Ql: NONREACTIVE

## 2013-10-26 LAB — PROTEIN / CREATININE RATIO, URINE
Creatinine, Urine: 64.38 mg/dL
Total Protein, Urine: 16 mg/dL

## 2013-10-26 MED ORDER — DIPHENHYDRAMINE HCL 25 MG PO CAPS: 25.0000 mg | ORAL_CAPSULE | Freq: Four times a day (QID) | ORAL | Status: DC | PRN

## 2013-10-26 MED ORDER — TETANUS-DIPHTH-ACELL PERTUSSIS 5-2.5-18.5 LF-MCG/0.5 IM SUSP
0.5000 mL | Freq: Once | INTRAMUSCULAR | Status: AC
  Administered 2013-10-27: 0.5 mL via INTRAMUSCULAR
  Filled 2013-10-26: qty 0.5

## 2013-10-26 MED ORDER — SENNOSIDES-DOCUSATE SODIUM 8.6-50 MG PO TABS
2.0000 | ORAL_TABLET | ORAL | Status: DC
  Administered 2013-10-26: 2 via ORAL
  Filled 2013-10-26 (×2): qty 2

## 2013-10-26 MED ORDER — SIMETHICONE 80 MG PO CHEW: 80.0000 mg | CHEWABLE_TABLET | ORAL | Status: DC | PRN

## 2013-10-26 MED ORDER — LIDOCAINE HCL (PF) 1 % IJ SOLN: 30.0000 mL | INTRAMUSCULAR | Status: DC | PRN

## 2013-10-26 MED ORDER — BENZOCAINE-MENTHOL 20-0.5 % EX AERO: 1.0000 "application " | INHALATION_SPRAY | CUTANEOUS | Status: DC | PRN

## 2013-10-26 MED ORDER — PRENATAL MULTIVITAMIN CH
1.0000 | ORAL_TABLET | Freq: Every day | ORAL | Status: DC
  Administered 2013-10-26 – 2013-10-28 (×3): 1 via ORAL
  Filled 2013-10-26 (×3): qty 1

## 2013-10-26 MED ORDER — ACETAMINOPHEN 325 MG PO TABS: 650.0000 mg | ORAL_TABLET | ORAL | Status: DC | PRN

## 2013-10-26 MED ORDER — WITCH HAZEL-GLYCERIN EX PADS: 1.0000 "application " | MEDICATED_PAD | CUTANEOUS | Status: DC | PRN

## 2013-10-26 MED ORDER — OXYCODONE-ACETAMINOPHEN 5-325 MG PO TABS: 1.0000 | ORAL_TABLET | ORAL | Status: DC | PRN

## 2013-10-26 MED ORDER — OXYTOCIN BOLUS FROM INFUSION: 500.0000 mL | INTRAVENOUS | Status: DC

## 2013-10-26 MED ORDER — LANOLIN HYDROUS EX OINT: TOPICAL_OINTMENT | CUTANEOUS | Status: DC | PRN

## 2013-10-26 MED ORDER — ONDANSETRON HCL 4 MG/2ML IJ SOLN: 4.0000 mg | INTRAMUSCULAR | Status: DC | PRN

## 2013-10-26 MED ORDER — ONDANSETRON HCL 4 MG/2ML IJ SOLN: 4.0000 mg | Freq: Four times a day (QID) | INTRAMUSCULAR | Status: DC | PRN

## 2013-10-26 MED ORDER — ONDANSETRON HCL 4 MG PO TABS: 4.0000 mg | ORAL_TABLET | ORAL | Status: DC | PRN

## 2013-10-26 MED ORDER — DIBUCAINE 1 % RE OINT: 1.0000 "application " | TOPICAL_OINTMENT | RECTAL | Status: DC | PRN

## 2013-10-26 MED ORDER — OXYCODONE-ACETAMINOPHEN 5-325 MG PO TABS
1.0000 | ORAL_TABLET | ORAL | Status: DC | PRN
  Administered 2013-10-26 – 2013-10-27 (×6): 2 via ORAL
  Administered 2013-10-28: 1 via ORAL
  Filled 2013-10-26: qty 2
  Filled 2013-10-26: qty 1
  Filled 2013-10-26: qty 2
  Filled 2013-10-26: qty 1
  Filled 2013-10-26: qty 2
  Filled 2013-10-26: qty 1
  Filled 2013-10-26 (×2): qty 2

## 2013-10-26 MED ORDER — OXYTOCIN 40 UNITS IN LACTATED RINGERS INFUSION - SIMPLE MED: 62.5000 mL/h | INTRAVENOUS | Status: DC

## 2013-10-26 MED ORDER — LACTATED RINGERS IV SOLN: 500.0000 mL | INTRAVENOUS | Status: DC | PRN

## 2013-10-26 MED ORDER — LACTATED RINGERS IV SOLN: INTRAVENOUS | Status: DC

## 2013-10-26 MED ORDER — ENALAPRIL MALEATE 10 MG PO TABS
10.0000 mg | ORAL_TABLET | Freq: Every day | ORAL | Status: DC
  Administered 2013-10-26 – 2013-10-28 (×3): 10 mg via ORAL
  Filled 2013-10-26 (×4): qty 1

## 2013-10-26 MED ORDER — FLEET ENEMA 7-19 GM/118ML RE ENEM: 1.0000 | ENEMA | RECTAL | Status: DC | PRN

## 2013-10-26 MED ORDER — ZOLPIDEM TARTRATE 5 MG PO TABS: 5.0000 mg | ORAL_TABLET | Freq: Every evening | ORAL | Status: DC | PRN

## 2013-10-26 MED ORDER — CITRIC ACID-SODIUM CITRATE 334-500 MG/5ML PO SOLN: 30.0000 mL | ORAL | Status: DC | PRN

## 2013-10-26 MED ORDER — METHYLDOPA 250 MG PO TABS
250.0000 mg | ORAL_TABLET | Freq: Two times a day (BID) | ORAL | Status: DC
  Administered 2013-10-26: 250 mg via ORAL
  Filled 2013-10-26 (×3): qty 1

## 2013-10-26 MED ORDER — PENICILLIN G POTASSIUM 5000000 UNITS IJ SOLR: 2.5000 10*6.[IU] | INTRAVENOUS | Status: DC

## 2013-10-26 MED ORDER — PENICILLIN G POTASSIUM 5000000 UNITS IJ SOLR: 5.0000 10*6.[IU] | Freq: Once | INTRAMUSCULAR | Status: DC

## 2013-10-26 NOTE — Progress Notes (Signed)
Patient ID: Stephanie Cook, female   DOB: 1986/12/09, 28 y.o.   MRN: [id number]  RN informed me that the patient's water just broke  FHR stable UCs irregular  Will defer cervical exam for now  Anticipate SVD

## 2013-10-26 NOTE — Progress Notes (Signed)
Stephanie Cook is a 27 y.o. [phone] at [redacted]w[redacted]d   Subjective: Tolerating labor well with epidural. No concerns.   Objective: BP 119/77  Pulse 86  Temp(Src) 98.3 F (36.8 C) (Oral)  Resp 16  Ht 5\' 3"  (1.6 m)  Wt 75.297 kg (166 lb)  BMI 29.41 kg/m2  SpO2 98%  LMP 01/22/2013      FHT:  FHR: 130 bpm, variability: moderate,  accelerations:  Present,  decelerations:  Absent UC:   irregular, every 3-5 minutes SVE:   Dilation: 5 Effacement (%): 70 Station: -2 Exam by:: Theora Gianotti  Labs: Lab Results  Component Value Date   WBC 11.7* 10/25/2013   HGB 10.5* 10/25/2013   HCT 31.5* 10/25/2013   MCV 82.5 10/25/2013   PLT 234 10/25/2013    Assessment / Plan: Spontaneous labor, progressing normally  Labor: Progressing normally Preeclampsia:  N/A Fetal Wellbeing:  Category I Pain Control:  Epidural I/D:  HSV 2 Anticipated MOD:  NSVD  Stephanie Cook 10/26/2013, 6:04 AM  Seen and agree Aviva Signs, CNM

## 2013-10-26 NOTE — Progress Notes (Signed)
Stephanie Cook is a 27 y.o. [phone] at [redacted]w[redacted]d by ultrasound admitted for Preterm labor  Subjective: Resting comfortably  Objective: BP 130/80  Pulse 92  Temp(Src) 98.1 F (36.7 C) (Axillary)  Resp 18  Ht 5\' 3"  (1.6 m)  Wt 166 lb (75.297 kg)  BMI 29.41 kg/m2  SpO2 98%  LMP 01/22/2013      FHT:  FHR: 145 bpm, variability: moderate,  accelerations:  Present,  decelerations:  Absent UC:   irregular, every 8 minutes SVE:   Dilation: 5 Effacement (%): 70 Station: -2 Exam by:: T. Sprague RN  Labs: Lab Results  Component Value Date   WBC 11.7* 10/25/2013   HGB 10.5* 10/25/2013   HCT 31.5* 10/25/2013   MCV 82.5 10/25/2013   PLT 234 10/25/2013    Assessment / Plan: Preterm Labor, contractions spacing out  Labor: Progressing normally and slowly due to spacing of contractions Preeclampsia:  n/a Fetal Wellbeing:  Category I Pain Control:  Labor support without medications I/D:  n/a Anticipated MOD:  NSVD Discussed with Dr Debroah Loop. WIll not augment labor since she is preterm. Will wait to see how it progresses  [facility] 10/26/2013, 12:26 AM

## 2013-10-27 ENCOUNTER — Other Ambulatory Visit: Payer: Medicaid Other | Admitting: Obstetrics & Gynecology

## 2013-10-27 MED ORDER — NAPROXEN 500 MG PO TABS
500.0000 mg | ORAL_TABLET | Freq: Two times a day (BID) | ORAL | Status: DC
  Administered 2013-10-27 – 2013-10-28 (×3): 500 mg via ORAL
  Filled 2013-10-27 (×5): qty 1

## 2013-10-27 NOTE — Progress Notes (Signed)
Post Partum Day 1 s/p NSVD Subjective: + abdominal cramping, percocet not helping much. Up ad lib, voiding and tolerating PO, lochia > menses  Objective: Blood pressure 142/92, pulse 75, temperature 99.1 F (37.3 C), temperature source Oral, resp. rate 18, height 5\' 3"  (1.6 m), weight 75.297 kg (166 lb), last menstrual period 01/22/2013, SpO2 98.00%, unknown if currently breastfeeding.  Physical Exam:  General: alert, cooperative and no distress Lochia:normal flow Chest: CTAB Heart: RRR no m/r/g Abdomen: +BS, soft, nontender,  Uterine Fundus: firm DVT Evaluation: No evidence of DVT seen on physical exam. Extremities: no edema   Recent Labs  10/25/13 1715  HGB 10.5*  HCT 31.5*    Assessment/Plan: 1. S/p NSVD. Doing well. Continue routine postpartum care.  2. Cramping: Pt allergic to Ibuprofen (hives/itching). Will try Naproxen instead.  3. Pt would like another day to recover and her baby must stay 48 hours due to being preterm. Plan for discharge tomorrow.  4. She is bottle feeding.  5. She plans to use Nexplanon for postpartum contraception.    LOS: 2 days   [place], Ryann 10/27/2013, 9:43 AM   I have seen and examined this patient and agree with above documentation in the resident's note.   Rulon Abide, M.D. [facility] Fellow 10/27/2013 4:48 PM

## 2013-10-27 NOTE — Progress Notes (Signed)
UR chart review completed.  

## 2013-10-27 NOTE — Anesthesia Postprocedure Evaluation (Signed)
  Anesthesia Post-op Note  Patient: Stephanie Cook  Procedure(s) Performed: * No procedures listed *  Patient Location: Mother/Baby  Anesthesia Type:Epidural  Level of Consciousness: awake, alert , oriented and patient cooperative  Airway and Oxygen Therapy: Patient Spontanous Breathing  Post-op Pain: moderate abdominal pain.  Receiving percocet.  Post-op Assessment: Patient's Cardiovascular Status Stable, Respiratory Function Stable, No headache, No backache, No residual numbness and No residual motor weakness  Post-op Vital Signs: stable  Complications: No apparent anesthesia complications

## 2013-10-28 MED ORDER — ENALAPRIL MALEATE 10 MG PO TABS: 10.0000 mg | ORAL_TABLET | Freq: Every day | ORAL | Status: DC

## 2013-10-28 NOTE — Clinical Social Work Maternal (Signed)
Clinical Social Work Department PSYCHOSOCIAL ASSESSMENT - MATERNAL/CHILD 10/28/2013  Patient:  Stephanie Cook, Stephanie Cook  Account Number:  [id number]  Admit Date:  10/25/2013  Marjo Bicker Name:   Regan Lemming    Clinical Social Worker:  Nobie Putnam, LCSW   Date/Time:  10/28/2013 01:49 PM  Date Referred:  10/28/2013   Referral source  CN     Referred reason  Substance Abuse  Depression/Anxiety   Other referral source:    I:  FAMILY / HOME ENVIRONMENT Child's legal guardian:  PARENT  Guardian - Name Guardian - Age Guardian - Address  Stephanie Cook [address]; [place], [place] [address]  Orlan Leavens 35    Other household support members/support persons Other support:    II  PSYCHOSOCIAL DATA Information Source:  Patient Interview  Event organiser Employment:   Surveyor, quantity resources:  [employer] If Medicaid - County:  [employer] Other  Sales executive  WIC   School / Grade:   Maternity Care Coordinator / Child Services Coordination / Early Interventions:  Cultural issues impacting care:    III  STRENGTHS Strengths  Adequate Resources  Home prepared for Child (including basic supplies)   Strength comment:    IV  RISK FACTORS AND CURRENT PROBLEMS Current Problem:  YES   Risk Factor & Current Problem Patient Issue Family Issue Risk Factor / Current Problem Comment  Substance Abuse Y N Hx of MJ use  Mental Illness Y N Hx of depression    V  SOCIAL WORK ASSESSMENT CSW met with pt to assess history of MJ use & depression. Pt admits to smoking MJ "twice every 2 weeks, " prior to pregnancy confirmation.  Initially pt told this writer that she stopped smoking MJ when she learned of pregnancy.  Then after CSW explained hospital drug testing policy, she admitted to smoking during the pregnancy.  She was not clear about how often she smoked.  The last time she smoked was last month.  UDS is negative, meconium results are pending.  Pt has history with  CPS, which resulted in her children (ages 30, 3 & 1) being placed with their paternal aunt, Pryor Montes.  They have not been in her care for 1 year.  Pt was not able to verbalize the reason CPS was involved.  FOB was present & told CSW that the other children's father didn't like him, as explanation for removal.  CSW contact [facility] after hours CPS worker (due to holiday) to ask if the agency had concerns about pt parenting.  No concerns had at this time but the agency plans to look into the situation further tomorrow when the office is open.  Pt sought counseling services at [facility] in August or September this year.  She was diagnosed with depression & bipolar disorder.  She is seen weekly by a counselor, Di Kindle & thinks that the services are helping a lot.  Pt told CSW that she experienced a "rough childhood" & has history of suicide attempts.  She denies any recent attempts.  Pt has all the necessary supplies for the infant.  Her support system seems to be limited to the FOB & his family.  Pt plans to continue counseling services upon discharge.  CSW will continue to monitor drug screen results & make a referral if needed.      VI SOCIAL WORK PLAN Social Work Plan  No Further Intervention Required / No Barriers to Discharge   Type of pt/family education:  If child protective services report - county:   If child protective services report - date:   Information/referral to community resources comment:   Other social work plan:      

## 2013-10-28 NOTE — Discharge Summary (Signed)
Obstetric Discharge Summary Reason for Admission: onset of labor and preterm Prenatal Procedures: NST Intrapartum Procedures: spontaneous vaginal delivery Postpartum Procedures: none Complications-Operative and Postpartum: Persistent elvation of BP, started on vasotec 10mg  qday Hemoglobin  Date Value Range Status  10/25/2013 10.5* 12.0 - 15.0 g/dL Final     HCT  Date Value Range Status  10/25/2013 31.5* 36.0 - 46.0 % Final    Physical Exam:  General: alert, cooperative, appears stated age and no distress Lochia: appropriate Uterine Fundus: firm U-1 DVT Evaluation: No evidence of DVT seen on physical exam. Negative Homan's sign. No cords or calf tenderness. No significant calf/ankle edema.  Discharge Diagnoses: Term Pregnancy-delivered and chronic HTN  Discharge Information: Date: 10/28/2013 Activity: pelvic rest Diet: routine Medications: PNV, Ibuprofen and Vasotec 10mg  qday Condition: stable Instructions: refer to practice specific booklet Discharge to: home Follow-up Information   Follow up with [facility]. Schedule an appointment as soon as possible for a visit in 4 weeks.   Specialty:  Obstetrics and Gynecology   Contact information:   [address] [place] [address] [phone]      Newborn Data: Live born female  Birth Weight: 6 lb 5.2 oz (2870 g) APGAR: 9, 9  Home with mother.  Preterm NSVD of healthy infant over intact perineum. Met milestones PP. Elevated BP, started on vasotec 10mg . Message sent to [facility] for BP check this week on new med. No severe ranges after starting. Will consider increase dose at f/u and BMP at f/u  Stephanie Cook 10/28/2013, 6:30 AM

## 2013-10-30 ENCOUNTER — Other Ambulatory Visit: Payer: Medicaid Other

## 2013-10-30 ENCOUNTER — Encounter: Payer: Medicaid Other | Admitting: Obstetrics & Gynecology

## 2013-11-04 ENCOUNTER — Ambulatory Visit: Payer: Medicaid Other | Admitting: Women's Health

## 2013-11-06 ENCOUNTER — Telehealth: Payer: Self-pay

## 2013-11-06 NOTE — Telephone Encounter (Signed)
Nurse visited pt home. Took B/P 158/112 And 160/110. Pt had not got around to take B/P Medicine. SCORE #6 on Edinberg Scale. Nurse state Pt change answer.hardly ever to long time ago. The question was, have you ever tired to harm self. Spoke to [facility], advise Pt to B/P MEDICINE. Unable to reach pt by phone. Left message.Marland Kitchen

## 2013-11-25 ENCOUNTER — Ambulatory Visit (INDEPENDENT_AMBULATORY_CARE_PROVIDER_SITE_OTHER): Payer: Medicaid Other | Admitting: Women's Health

## 2013-11-25 ENCOUNTER — Encounter: Payer: Self-pay | Admitting: Women's Health

## 2013-11-25 VITALS — BP 130/100 | Ht 63.0 in | Wt 147.6 lb

## 2013-11-25 DIAGNOSIS — Z309 Encounter for contraceptive management, unspecified: Secondary | ICD-10-CM

## 2013-11-25 DIAGNOSIS — Z3202 Encounter for pregnancy test, result negative: Secondary | ICD-10-CM

## 2013-11-25 DIAGNOSIS — I10 Essential (primary) hypertension: Secondary | ICD-10-CM

## 2013-11-25 DIAGNOSIS — F32A Depression, unspecified: Secondary | ICD-10-CM

## 2013-11-25 DIAGNOSIS — O0990 Supervision of high risk pregnancy, unspecified, unspecified trimester: Secondary | ICD-10-CM

## 2013-11-25 DIAGNOSIS — F329 Major depressive disorder, single episode, unspecified: Secondary | ICD-10-CM

## 2013-11-25 LAB — POCT URINE PREGNANCY: Preg Test, Ur: NEGATIVE

## 2013-11-25 MED ORDER — ESCITALOPRAM OXALATE 20 MG PO TABS: 20.0000 mg | ORAL_TABLET | Freq: Every day | ORAL | Status: DC

## 2013-11-25 MED ORDER — HYDROCHLOROTHIAZIDE 12.5 MG PO CAPS: 12.5000 mg | ORAL_CAPSULE | Freq: Every day | ORAL | Status: DC

## 2013-11-25 MED ORDER — MEDROXYPROGESTERONE ACETATE 150 MG/ML IM SUSP: 150.0000 mg | INTRAMUSCULAR | Status: DC

## 2013-11-25 MED ORDER — AMLODIPINE BESYLATE 10 MG PO TABS: 10.0000 mg | ORAL_TABLET | Freq: Every day | ORAL | Status: DC

## 2013-11-25 NOTE — Progress Notes (Signed)
Patient ID: Stephanie Cook, female   DOB: June 11, 1986, 27 y.o.   MRN: [id number] Subjective:    Stephanie Cook is a 27 y.o. [phone] [place] American female who presents for a postpartum visit. She is 4 weeks postpartum following a spontaneous vaginal delivery at 36.3 gestational weeks d/t spontaneous PTL. Had been on 17P during pregnancy d/t h/o PTB @ 33wks.  Anesthesia: epidural. I have fully reviewed the prenatal and intrapartum course. Postpartum course has been complicated by depression. Baby's course has been uncomplicated. Baby is feeding by bottle. Bleeding x 2.5 weeks, none since. Bowel function is normal. Bladder function is normal. Patient is sexually active. Contraception method is condoms and desires depo asap. Discussed all options of contraception, and potential for depo to increase depression, she would like to proceed w/ depo. She had sex on 12/7, used condom. Understands that we have to wait at least 10d from last SI, get bhcg am, and do depo pm as long as bhcg neg. Postpartum depression screening: positive. Score 19. Reports suicidal ideations, no plan. Denies HI/II. Has been seeing Brewing technologist at [facility]. She states she was told last week that Dishema would like to start her on an antidepressant at next visit. She missed that visit, and is going to reschedule today. She does not have custody of her other 3 children. She did receive a SW consult while in hospital after having this baby, and baby was allowed to go home in her care.    She has been taking vasotec 10mg  daily as directed. BP today still elevated. She took norvasc 10mg  daily and hctz 12.5mg  daily prior to pregnancy. She does not have a PCP.  The following portions of the patient's history were reviewed and updated as appropriate: allergies, current medications, past medical history, past surgical history and problem list.  Review of Systems Pertinent items are noted in HPI.   Filed Vitals:   11/25/13 0949  BP: 130/100  Height: 5\' 3"  (1.6 m)  Weight: 147 lb 9.6 oz (66.951 kg)    Objective:     General:  alert, cooperative and no distress   Breasts:  deferred, no complaints  Lungs: clear to auscultation bilaterally  Heart:  regular rate and rhythm  Abdomen: soft, nontender   Vulva: normal  Vagina: normal vagina  Cervix:  closed  Corpus: Well-involuted  Adnexa:  Non-palpable  Rectal Exam: No hemorrhoids        Assessment:   Postpartum exam 4 wks s/p SVD @ 36wks d/t spontaneous PTL Depression screening Contraception counseling  Depression- currently in counseling CHTN  Plan:   Contraception: requests depo. Last SI 12/7, to come 12/17 for bhcg am, depo pm. To remain abstinent until after depo given.  Stop vasotec. Rx norvasc 10mg  daily, HCTZ 12.5mg  daily per pre-pregnancy regimen Rx lexapro 20mg  daily- pt understands may take 3-4weeks to begin working Pt to call [employer] at [facility] services to reschedule missed appt today To go straight to ED if she thinks she may act on suicidal ideations Follow up in: 1 week for bp check, wellness check, or earlier as needed.    Marge Duncans 11/25/2013 9:57 AM

## 2013-11-25 NOTE — Patient Instructions (Signed)
[facility] [phone]               [facility] [phone]               [facility] ([address]) [phone]   Depression, Adult Depression refers to feeling sad, low, down in the dumps, blue, gloomy, or empty. In general, there are two kinds of depression: 1. Depression that we all experience from time to time because of upsetting life experiences, including the loss of a job or the ending of a relationship (normal sadness or normal grief). This kind of depression is considered normal, is short lived, and resolves within a few days to 2 weeks. (Depression experienced after the loss of a loved one is called bereavement. Bereavement often lasts longer than 2 weeks but normally gets better with time.) 2. Clinical depression, which lasts longer than normal sadness or normal grief or interferes with your ability to function at home, at work, and in school. It also interferes with your personal relationships. It affects almost every aspect of your life. Clinical depression is an illness. Symptoms of depression also can be caused by conditions other than normal sadness and grief or clinical depression. Examples of these conditions are listed as follows:  Physical illness Some physical illnesses, including underactive thyroid gland (hypothyroidism), severe anemia, specific types of cancer, diabetes, uncontrolled seizures, heart and lung problems, strokes, and chronic pain are commonly associated with symptoms of depression.  Side effects of some prescription medicine In some people, certain types of prescription medicine can cause symptoms of depression.  Substance abuse Abuse of alcohol and illicit drugs can cause symptoms of depression. SYMPTOMS Symptoms of normal sadness and normal grief include the following:  Feeling sad or crying for short periods of time.  Not caring about anything (apathy).  Difficulty sleeping or sleeping  too much.  No longer able to enjoy the things you used to enjoy.  Desire to be by oneself all the time (social isolation).  Lack of energy or motivation.  Difficulty concentrating or remembering.  Change in appetite or weight.  Restlessness or agitation. Symptoms of clinical depression include the same symptoms of normal sadness or normal grief and also the following symptoms:  Feeling sad or crying all the time.  Feelings of guilt or worthlessness.  Feelings of hopelessness or helplessness.  Thoughts of suicide or the desire to harm yourself (suicidal ideation).  Loss of touch with reality (psychotic symptoms). Seeing or hearing things that are not real (hallucinations) or having false beliefs about your life or the people around you (delusions and paranoia). DIAGNOSIS  The diagnosis of clinical depression usually is based on the severity and duration of the symptoms. Your caregiver also will ask you questions about your medical history and substance use to find out if physical illness, use of prescription medicine, or substance abuse is causing your depression. Your caregiver also may order blood tests. TREATMENT  Typically, normal sadness and normal grief do not require treatment. However, sometimes antidepressant medicine is prescribed for bereavement to ease the depressive symptoms until they resolve. The treatment for clinical depression depends on the severity of your symptoms but typically includes antidepressant medicine, counseling with a mental health professional, or a combination of both. Your caregiver will help to determine what treatment is best for you. Depression caused by physical illness usually goes away with appropriate medical treatment of the illness. If prescription medicine is causing depression, talk with your caregiver  about stopping the medicine, decreasing the dose, or substituting another medicine. Depression caused by abuse of alcohol or illicit drugs  abuse goes away with abstinence from these substances. Some adults need professional help in order to stop drinking or using drugs. SEEK IMMEDIATE CARE IF:  You have thoughts about hurting yourself or others.  You lose touch with reality (have psychotic symptoms).  You are taking medicine for depression and have a serious side effect. FOR MORE INFORMATION National Alliance on Mental Illness: www.nami.[employer]: http://www.maynard.net/ Document Released: 12/01/2000 Document Revised: 06/04/2012 Document Reviewed: 03/04/2012 [facility] Patient Information 2014 [place], [place].

## 2013-11-26 ENCOUNTER — Encounter: Payer: Medicaid Other | Admitting: Advanced Practice Midwife

## 2013-12-02 ENCOUNTER — Ambulatory Visit (INDEPENDENT_AMBULATORY_CARE_PROVIDER_SITE_OTHER): Payer: Medicaid Other | Admitting: Women's Health

## 2013-12-02 ENCOUNTER — Encounter: Payer: Self-pay | Admitting: Women's Health

## 2013-12-02 VITALS — BP 120/88 | Ht 63.0 in | Wt 144.2 lb

## 2013-12-02 DIAGNOSIS — F329 Major depressive disorder, single episode, unspecified: Secondary | ICD-10-CM

## 2013-12-02 DIAGNOSIS — O99345 Other mental disorders complicating the puerperium: Secondary | ICD-10-CM

## 2013-12-02 DIAGNOSIS — I10 Essential (primary) hypertension: Secondary | ICD-10-CM

## 2013-12-02 MED ORDER — SERTRALINE HCL 50 MG PO TABS: 50.0000 mg | ORAL_TABLET | Freq: Every day | ORAL | Status: DC

## 2013-12-02 NOTE — Progress Notes (Signed)
Patient ID: Stephanie Cook, female   DOB: 04-18-1986, 27 y.o.   MRN: [id number] [name] is a 27 y.o. [phone] female here today for bp check since switched to norvasc 10mg  and hctz 12.5mg  and f/u after being placed on lexapro last week. She states she didn't like the way lexapro made her feel, she had nightmares, ha, felt nauseated, so she stopped taking. Has tried to contact counselor at [facility], but she has not called her back. Denies SI/HI/II today. Plans for depo tomorrow, hasn't had sex since 12/7.   O: BP 120/88  Ht 5\' 3"  (1.6 m)  Wt 144 lb 3.2 oz (65.409 kg)  BMI 25.55 kg/m2  Breastfeeding? No       Appears happy, her bf is with her today, she is smiling and laughing  A: CHTN, bp better today since switched back to pre-pregnancy meds     Depression     Contraception counseling  P: Return in am for bhcg, pm for depo, no sex until after depo, use condoms for 2wks     Rx zoloft 50mg  daily #30 w/ 3RF     To call counselor at [facility] today to reschedule appt     F/U 3wks to see how zoloft is working  Cheral Marker, CNM, [facility] 12/02/2013 10:44 AM

## 2013-12-02 NOTE — Patient Instructions (Signed)
Call your counselor at [facility] today to reschedule appointment No sex until after depo

## 2013-12-03 ENCOUNTER — Ambulatory Visit (INDEPENDENT_AMBULATORY_CARE_PROVIDER_SITE_OTHER): Payer: Medicaid Other | Admitting: Adult Health

## 2013-12-03 ENCOUNTER — Other Ambulatory Visit: Payer: Medicaid Other

## 2013-12-03 ENCOUNTER — Encounter: Payer: Self-pay | Admitting: Adult Health

## 2013-12-03 VITALS — BP 134/82 | Ht 63.0 in | Wt 144.5 lb

## 2013-12-03 DIAGNOSIS — Z309 Encounter for contraceptive management, unspecified: Secondary | ICD-10-CM

## 2013-12-03 DIAGNOSIS — Z3200 Encounter for pregnancy test, result unknown: Secondary | ICD-10-CM

## 2013-12-03 DIAGNOSIS — Z3049 Encounter for surveillance of other contraceptives: Secondary | ICD-10-CM

## 2013-12-03 MED ORDER — MEDROXYPROGESTERONE ACETATE 150 MG/ML IM SUSP
150.0000 mg | Freq: Once | INTRAMUSCULAR | Status: AC
  Administered 2013-12-03: 150 mg via INTRAMUSCULAR

## 2013-12-03 NOTE — Progress Notes (Signed)
Patient ID: Stephanie Cook, female   DOB: Feb 02, 1986, 27 y.o.   MRN: [id number] Depo Provera 150 mg given in left deltoid,with  no complication, QHCG negative this am. Pt to return in 12 weeks for next injection.

## 2014-02-26 ENCOUNTER — Ambulatory Visit: Payer: Medicaid Other

## 2014-03-11 ENCOUNTER — Encounter (HOSPITAL_COMMUNITY): Payer: Self-pay | Admitting: *Deleted

## 2014-03-11 ENCOUNTER — Emergency Department (HOSPITAL_COMMUNITY)
Admission: EM | Admit: 2014-03-11 | Discharge: 2014-03-11 | Disposition: A | Discharge status: Other Acute Inpatient Hospital | Payer: MEDICAID | Source: Home / Self Care | Attending: Emergency Medicine | Admitting: Emergency Medicine

## 2014-03-11 ENCOUNTER — Inpatient Hospital Stay (HOSPITAL_COMMUNITY)
Admission: AD | Admit: 2014-03-11 | Discharge: 2014-03-18 | DRG: 885 | Disposition: A | Discharge status: Home / Self Care | Payer: MEDICAID | Source: Intra-hospital | Attending: Psychiatry | Admitting: Psychiatry

## 2014-03-11 ENCOUNTER — Encounter (HOSPITAL_COMMUNITY): Payer: Self-pay | Admitting: Emergency Medicine

## 2014-03-11 DIAGNOSIS — Z9119 Patient's noncompliance with other medical treatment and regimen: Secondary | ICD-10-CM

## 2014-03-11 DIAGNOSIS — Z5989 Other problems related to housing and economic circumstances: Secondary | ICD-10-CM | POA: Diagnosis not present

## 2014-03-11 DIAGNOSIS — O99019 Anemia complicating pregnancy, unspecified trimester: Secondary | ICD-10-CM

## 2014-03-11 DIAGNOSIS — F329 Major depressive disorder, single episode, unspecified: Secondary | ICD-10-CM | POA: Insufficient documentation

## 2014-03-11 DIAGNOSIS — F1210 Cannabis abuse, uncomplicated: Secondary | ICD-10-CM | POA: Insufficient documentation

## 2014-03-11 DIAGNOSIS — I10 Essential (primary) hypertension: Secondary | ICD-10-CM | POA: Diagnosis present

## 2014-03-11 DIAGNOSIS — Z79899 Other long term (current) drug therapy: Secondary | ICD-10-CM | POA: Insufficient documentation

## 2014-03-11 DIAGNOSIS — O0990 Supervision of high risk pregnancy, unspecified, unspecified trimester: Secondary | ICD-10-CM

## 2014-03-11 DIAGNOSIS — F323 Major depressive disorder, single episode, severe with psychotic features: Principal | ICD-10-CM | POA: Diagnosis present

## 2014-03-11 DIAGNOSIS — O09219 Supervision of pregnancy with history of pre-term labor, unspecified trimester: Secondary | ICD-10-CM

## 2014-03-11 DIAGNOSIS — O09899 Supervision of other high risk pregnancies, unspecified trimester: Secondary | ICD-10-CM

## 2014-03-11 DIAGNOSIS — Z91199 Patient's noncompliance with other medical treatment and regimen due to unspecified reason: Secondary | ICD-10-CM

## 2014-03-11 DIAGNOSIS — F12251 Cannabis dependence with psychotic disorder with hallucinations: Secondary | ICD-10-CM | POA: Diagnosis present

## 2014-03-11 DIAGNOSIS — F1220 Cannabis dependence, uncomplicated: Secondary | ICD-10-CM | POA: Diagnosis present

## 2014-03-11 DIAGNOSIS — H5316 Psychophysical visual disturbances: Secondary | ICD-10-CM | POA: Diagnosis present

## 2014-03-11 DIAGNOSIS — Z8619 Personal history of other infectious and parasitic diseases: Secondary | ICD-10-CM

## 2014-03-11 DIAGNOSIS — F1994 Other psychoactive substance use, unspecified with psychoactive substance-induced mood disorder: Secondary | ICD-10-CM | POA: Diagnosis present

## 2014-03-11 DIAGNOSIS — Z5987 Material hardship due to limited financial resources, not elsewhere classified: Secondary | ICD-10-CM

## 2014-03-11 DIAGNOSIS — R443 Hallucinations, unspecified: Secondary | ICD-10-CM

## 2014-03-11 DIAGNOSIS — F3289 Other specified depressive episodes: Secondary | ICD-10-CM | POA: Insufficient documentation

## 2014-03-11 DIAGNOSIS — F411 Generalized anxiety disorder: Secondary | ICD-10-CM | POA: Diagnosis present

## 2014-03-11 DIAGNOSIS — O10019 Pre-existing essential hypertension complicating pregnancy, unspecified trimester: Secondary | ICD-10-CM

## 2014-03-11 DIAGNOSIS — F17200 Nicotine dependence, unspecified, uncomplicated: Secondary | ICD-10-CM | POA: Diagnosis present

## 2014-03-11 DIAGNOSIS — Z3202 Encounter for pregnancy test, result negative: Secondary | ICD-10-CM | POA: Insufficient documentation

## 2014-03-11 DIAGNOSIS — B009 Herpesviral infection, unspecified: Secondary | ICD-10-CM

## 2014-03-11 DIAGNOSIS — Z309 Encounter for contraceptive management, unspecified: Secondary | ICD-10-CM

## 2014-03-11 DIAGNOSIS — Z598 Other problems related to housing and economic circumstances: Secondary | ICD-10-CM | POA: Diagnosis not present

## 2014-03-11 DIAGNOSIS — F32A Depression, unspecified: Secondary | ICD-10-CM

## 2014-03-11 LAB — CBC WITH DIFFERENTIAL/PLATELET
Basophils Absolute: 0 10*3/uL (ref 0.0–0.1)
Basophils Relative: 0 % (ref 0–1)
EOS PCT: 5 % (ref 0–5)
Eosinophils Absolute: 0.4 10*3/uL (ref 0.0–0.7)
HEMATOCRIT: 38.2 % (ref 36.0–46.0)
Hemoglobin: 12.7 g/dL (ref 12.0–15.0)
LYMPHS ABS: 3.6 10*3/uL (ref 0.7–4.0)
LYMPHS PCT: 40 % (ref 12–46)
MCH: 29.3 pg (ref 26.0–34.0)
MCHC: 33.2 g/dL (ref 30.0–36.0)
MCV: 88 fL (ref 78.0–100.0)
Monocytes Absolute: 0.4 10*3/uL (ref 0.1–1.0)
Monocytes Relative: 5 % (ref 3–12)
NEUTROS ABS: 4.5 10*3/uL (ref 1.7–7.7)
Neutrophils Relative %: 50 % (ref 43–77)
Platelets: 217 10*3/uL (ref 150–400)
RBC: 4.34 MIL/uL (ref 3.87–5.11)
RDW: 13.6 % (ref 11.5–15.5)
WBC: 9 10*3/uL (ref 4.0–10.5)

## 2014-03-11 LAB — RAPID URINE DRUG SCREEN, HOSP PERFORMED
Amphetamines: NOT DETECTED
Barbiturates: NOT DETECTED
Benzodiazepines: NOT DETECTED
COCAINE: NOT DETECTED
Opiates: NOT DETECTED
Tetrahydrocannabinol: POSITIVE — AB

## 2014-03-11 LAB — BASIC METABOLIC PANEL
BUN: 17 mg/dL (ref 6–23)
CHLORIDE: 105 meq/L (ref 96–112)
CO2: 26 mEq/L (ref 19–32)
Calcium: 9.2 mg/dL (ref 8.4–10.5)
Creatinine, Ser: 0.61 mg/dL (ref 0.50–1.10)
GFR calc non Af Amer: >90 mL/min (ref >90)
Glucose, Bld: 101 mg/dL — ABNORMAL HIGH (ref 70–99)
Potassium: 4.3 mEq/L (ref 3.7–5.3)
SODIUM: 140 meq/L (ref 137–147)

## 2014-03-11 LAB — HEPATIC FUNCTION PANEL
ALT: 10 U/L (ref 0–35)
AST: 16 U/L (ref 0–37)
Albumin: 4 g/dL (ref 3.5–5.2)
Alkaline Phosphatase: 54 U/L (ref 39–117)
BILIRUBIN TOTAL: 0.4 mg/dL (ref 0.3–1.2)
Total Protein: 8 g/dL (ref 6.0–8.3)

## 2014-03-11 LAB — ETHANOL: Alcohol, Ethyl (B): <11 mg/dL (ref 0–11)

## 2014-03-11 LAB — PREGNANCY, URINE: PREG TEST UR: NEGATIVE

## 2014-03-11 MED ORDER — ENALAPRIL MALEATE 10 MG PO TABS
10.0000 mg | ORAL_TABLET | Freq: Every day | ORAL | Status: DC
  Administered 2014-03-11 – 2014-03-13 (×2): 10 mg via ORAL
  Filled 2014-03-11 (×3): qty 1
  Filled 2014-03-11: qty 2

## 2014-03-11 MED ORDER — HYDROXYZINE HCL 50 MG PO TABS
50.0000 mg | ORAL_TABLET | Freq: Every evening | ORAL | Status: DC | PRN
  Administered 2014-03-15: 50 mg via ORAL
  Filled 2014-03-11: qty 1

## 2014-03-11 MED ORDER — MEDROXYPROGESTERONE ACETATE 150 MG/ML IM SUSP
150.0000 mg | INTRAMUSCULAR | Status: DC
  Administered 2014-03-12: 150 mg via INTRAMUSCULAR
  Filled 2014-03-11: qty 1

## 2014-03-11 MED ORDER — QUETIAPINE FUMARATE 25 MG PO TABS
25.0000 mg | ORAL_TABLET | Freq: Every day | ORAL | Status: DC
  Administered 2014-03-11: 25 mg via ORAL
  Filled 2014-03-11 (×3): qty 1

## 2014-03-11 MED ORDER — CLONIDINE HCL 0.1 MG PO TABS
0.1000 mg | ORAL_TABLET | Freq: Three times a day (TID) | ORAL | Status: DC
  Administered 2014-03-11 – 2014-03-18 (×20): 0.1 mg via ORAL
  Filled 2014-03-11 (×24): qty 1

## 2014-03-11 MED ORDER — ALUM & MAG HYDROXIDE-SIMETH 200-200-20 MG/5ML PO SUSP: 30.0000 mL | ORAL | Status: DC | PRN

## 2014-03-11 MED ORDER — NICOTINE 21 MG/24HR TD PT24
21.0000 mg | MEDICATED_PATCH | Freq: Every day | TRANSDERMAL | Status: DC
  Filled 2014-03-11: qty 1

## 2014-03-11 MED ORDER — MAGNESIUM HYDROXIDE 400 MG/5ML PO SUSP: 30.0000 mL | Freq: Every day | ORAL | Status: DC | PRN

## 2014-03-11 MED ORDER — ACETAMINOPHEN 325 MG PO TABS
650.0000 mg | ORAL_TABLET | Freq: Four times a day (QID) | ORAL | Status: DC | PRN
  Administered 2014-03-11 – 2014-03-15 (×2): 650 mg via ORAL
  Filled 2014-03-11 (×2): qty 2

## 2014-03-11 MED ORDER — CLONIDINE HCL 0.1 MG PO TABS
0.1000 mg | ORAL_TABLET | Freq: Once | ORAL | Status: AC
Start: 2014-03-11 — End: 2014-03-11
  Administered 2014-03-11: 0.1 mg via ORAL
  Filled 2014-03-11: qty 1

## 2014-03-11 MED ORDER — ESCITALOPRAM OXALATE 20 MG PO TABS
20.0000 mg | ORAL_TABLET | Freq: Every day | ORAL | Status: DC
  Administered 2014-03-12: 20 mg via ORAL
  Filled 2014-03-11 (×2): qty 1

## 2014-03-11 NOTE — ED Provider Notes (Signed)
CSN: [id number]     Arrival date & time 03/11/14  1302 History   First MD Initiated Contact with Patient 03/11/14 1330     Chief Complaint  Patient presents with  . V70.1     (Consider location/radiation/quality/duration/timing/severity/associated sxs/prior Treatment) Patient is a 28 y.o. female presenting with altered mental status. The history is provided by the patient (the pt states she is hearing voices and wants help).  Altered Mental Status Presenting symptoms: no combativeness   Severity:  Moderate Most recent episode:  More than 2 days ago Episode history:  Multiple Timing:  Constant Progression:  Worsening Associated symptoms: hallucinations   Associated symptoms: no abdominal pain, no headaches, no rash and no seizures     Past Medical History  Diagnosis Date  . Hypertension   . Preterm labor   . STD (female)     chlamydia, trichomonas, gonorrhea, HPV, HSV  . Pregnant   . Hx of trichomoniasis     recurrent, 4 times  . Hx of chlamydia infection     multiple times  . Hx of gonorrhea   . HSV-2 (herpes simplex virus 2) infection   . Depression    Past Surgical History  Procedure Laterality Date  . No past surgeries     Family History  Problem Relation Age of Onset  . Mental illness Other   . Hypertension Other    History  Substance Use Topics  . Smoking status: Current Every Day Smoker -- 1.00 packs/day    Types: Cigarettes  . Smokeless tobacco: Never Used  . Alcohol Use: Yes     Comment: every 2 weeks   OB History   Grav Para Term Preterm Abortions TAB SAB Ect Mult Living   5 4 2 2 1  0 1 0 0 4     Review of Systems  Constitutional: Negative for appetite change and fatigue.  HENT: Negative for congestion, ear discharge and sinus pressure.   Eyes: Negative for discharge.  Respiratory: Negative for cough.   Cardiovascular: Negative for chest pain.  Gastrointestinal: Negative for abdominal pain and diarrhea.  Genitourinary: Negative for frequency  and hematuria.  Musculoskeletal: Negative for back pain.  Skin: Negative for rash.  Neurological: Negative for seizures and headaches.  Psychiatric/Behavioral: Positive for hallucinations.      Allergies  Motrin  Home Medications   Current Outpatient Rx  Name  Route  Sig  Dispense  Refill  . escitalopram (LEXAPRO) 20 MG tablet   Oral   Take 1 tablet (20 mg total) by mouth daily.   30 tablet   3   . medroxyPROGESTERone (DEPO-PROVERA) 150 MG/ML injection   Intramuscular   Inject 1 mL (150 mg total) into the muscle every 3 (three) months.   1 mL   3   . naproxen sodium (ALEVE) 220 MG tablet   Oral   Take 220 mg by mouth 2 (two) times daily as needed (tooth ache).         . QUEtiapine (SEROQUEL) 25 MG tablet   Oral   Take 25 mg by mouth at bedtime.          BP 132/85  Pulse 86  Temp(Src) 98.3 F (36.8 C) (Oral)  Resp 15  Ht 5\' 2"  (1.575 m)  Wt 144 lb (65.318 kg)  BMI 26.33 kg/m2  SpO2 100%  LMP 01/18/2014 Physical Exam  Constitutional: She is oriented to person, place, and time. She appears well-developed.  HENT:  Head: Normocephalic.  Eyes: Conjunctivae and  EOM are normal. No scleral icterus.  Neck: Neck supple. No thyromegaly present.  Cardiovascular: Normal rate and regular rhythm.  Exam reveals no gallop and no friction rub.   No murmur heard. Pulmonary/Chest: No stridor. She has no wheezes. She has no rales. She exhibits no tenderness.  Abdominal: She exhibits no distension. There is no tenderness. There is no rebound.  Musculoskeletal: Normal range of motion. She exhibits no edema.  Lymphadenopathy:    She has no cervical adenopathy.  Neurological: She is oriented to person, place, and time. She exhibits normal muscle tone. Coordination normal.  Skin: No rash noted. No erythema.  Psychiatric: She has a normal mood and affect. Her behavior is normal.  Auditory and visual hallucinations    ED Course  Procedures (including critical care  time) Labs Review Labs Reviewed  BASIC METABOLIC PANEL - Abnormal; Notable for the following:    Glucose, Bld 101 (*)    All other components within normal limits  URINE RAPID DRUG SCREEN (HOSP PERFORMED) - Abnormal; Notable for the following:    Tetrahydrocannabinol POSITIVE (*)    All other components within normal limits  CBC WITH DIFFERENTIAL  PREGNANCY, URINE  ETHANOL  HEPATIC FUNCTION PANEL   Imaging Review No results found.   EKG Interpretation None      MDM   Final diagnoses:  Hallucinations        Benny LennertJoseph L Susie Pousson, MD 03/11/14 [id number]

## 2014-03-11 NOTE — BH Assessment (Signed)
[facility] Assessment Progress Note  At 15:08 I spoke to EDP Bethann BerkshireJoseph Zammit, MD in anticipation of TTS assessment.  He reports that pt is experiencing AH & VH of a man telling her that she is going to die.  She presents in the ED voluntarily.  Doylene Canninghomas Carmella Kees, MA Triage Specialist 03/11/2014 @ 15:10

## 2014-03-11 NOTE — ED Notes (Signed)
Pt here with [employer].  Pt reports has been hearing a man's voice telling her to do "bad things" to people.  Pt says here today because now she can actually see this man and he is always telling her she is going to die.  Denies pain.  Denies SI recently but says was having SI 5 months ago.  Denies HI.

## 2014-03-11 NOTE — ED Notes (Addendum)
D Shular [facility] concerning [facility]. Stated pt has been hearing this mans voice for months but now started to see him. Pt wakes seeing him in her face and will start swinging and fighting. Pt calm and cooperative at this time. Pt is very scared. Also was told pt has been using marijuana. Can contact D Shular at [phone]

## 2014-03-11 NOTE — BH Assessment (Signed)
Tele Assessment Note   Stephanie McgregorBrittany D Fricker is a 28 y.o. single black female.  She presents at [facility] unaccompanied at the recommendation of her therapist, Britt BoozerDashima Schuler, [facility], to whom pt spoke today.  She is complaining of AH and VH.  Stressors: Pt reports a number of stressors.  She has been unemployed since losing her job at [employer] in 09/2013.  Her fiance is also unemployed.  They have been living with his grandmother for some time, and while the environment is without conflict at this time, they recently had an ultimatum to move out by 03/2014.  Pt also reports that in addition to her 284 month old child by the fiance, she has 3 other children that now live with family members at the request of the pt.  She want them in her household once she can afford to provide for them.  Lethality: Suicidality:  Pt denies SI at this time, but did experience SI as recently at 5 months ago, and on one occasion she attempted to cut her wrist, but the fiance interrupted her.  At the time the father of one of her children was trying to take custody of the child.  Pt now reports that her new baby is a deterrent against future suicide attempts.  Pt endorses depressed mood with symptoms noted in the "risk to self" assessment below. Homicidality: Pt denies homicidal thoughts or physical aggression.  She notes that she would like to kill the hallucination.  Pt denies having access to firearms, but she does have a pocketknife.  Pt denies having any legal problems at this time.  Pt is calm and cooperative during assessment. Psychosis: Pt reports that she has been experiencing AH of a voice telling her she is going to die for the past 6 - 7 months.  It will occasionally command her to beat people up, or to "talk shit to that person."  However, she does not comply with the command.  She reports that in the past couple days she has experienced VH of a man that accompanied the AH.  On further questioning, however, she  reports that the [facility] only presents as she is in the process of awakening, and it may be a hypnopomp.  During assessment pt does not appear to be responding to internal stimuli and exhibits no delusional thought.  Pt's reality testing appears to be intact. Substance Abuse: Pt denies any current or past substance abuse problems.  Please see pt's UDS results.  Pt does not appear to be intoxicated or in withdrawal at this time.  Social History: Pt identifies her fiance as her main social support.  She lives with the fiance and their 44 month old child in the home of the fiance's grandmother, along with his uncle and his cousin.  Pt has a 9th grade education.  As noted, she is unemployed.  She denies any history of abuse.  Treatment History: Pt denies any history of inpatient treatment.  She [facility] 6 - 7 months ago, [facility].  She has also seen an unspecified tele-provider for psychiatry who prescribed Lexapro and Seroquel for the pt.  However, pt did not think it was working, and on 03/03/2014 she stopped taking it.  Today pt is willing to consent [facility].   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  Hypertension   . Preterm labor   . STD (female)     chlamydia, trichomonas, gonorrhea, HPV, HSV  . Pregnant   . Hx of trichomoniasis     recurrent, 4 times  . Hx of chlamydia infection     multiple times  . Hx of gonorrhea   . HSV-2 (herpes simplex virus 2) infection   . Depression    Axis IV: economic problems, educational problems, housing problems, occupational problems, problems related to social environment and parent-child relational problems Axis V: GAF = 35  Past Medical History:  Past Medical History  Diagnosis Date  . Hypertension   . Preterm labor   . STD (female)     chlamydia, trichomonas, gonorrhea, HPV,  HSV  . Pregnant   . Hx of trichomoniasis     recurrent, 4 times  . Hx of chlamydia infection     multiple times  . Hx of gonorrhea   . HSV-2 (herpes simplex virus 2) infection   . Depression     Past Surgical History  Procedure Laterality Date  . No past surgeries      Family History:  Family History  Problem Relation Age of Onset  . Mental illness Other   . Hypertension Other     Social History:  reports that she has been smoking Cigarettes.  She has been smoking about 1.00 pack per day. She has never used smokeless tobacco. She reports that she drinks alcohol. She reports that she uses illicit drugs (Marijuana).  Additional Social History:  Alcohol / Drug Use Pain Medications: Denies Prescriptions: Denies Over the Counter: Denies History of alcohol / drug use?:  (Denies substance abuse problems.)  CIWA: CIWA-Ar BP: 132/85 mmHg Pulse Rate: 86 COWS:    Allergies:  Allergies  Allergen Reactions  . Motrin [Ibuprofen] Hives    Home Medications:  (Not in a hospital admission)  OB/GYN Status:  Patient's last menstrual period was 01/18/2014.  General Assessment Data Location of Assessment: [facility] Is this a Tele or Face-to-Face Assessment?: Tele Assessment Is this an Initial Assessment or a Re-assessment for this encounter?: Initial Assessment Living Arrangements: Other (Comment);Children;Spouse/significant other (Fiance, his grandmother, uncle & cousin, their 4 m/o child) Can pt return to current living arrangement?: Yes Admission Status: Voluntary Is patient capable of signing voluntary admission?: Yes Transfer from: Acute Hospital Referral Source: Other ([facility])     [facility] Crisis Care Plan Living Arrangements: Other (Comment);Children;Spouse/significant other (Fiance, his grandmother, uncle & cousin, their 4 m/o child) Name of Psychiatrist: Unspecified tele-provider Name of Therapist: Britt Boozer @ [facility]  Education Status Is patient currently in  school?: No Highest grade of school patient has completed: 9 Contact person: Valerie Roys (grandmother) [phone]  Risk to self Suicidal Ideation: No Suicidal Intent: No Is patient at risk for suicide?: No Suicidal Plan?: No Access to Means: No What has been your use of drugs/alcohol within the last 12 months?: Denies: UDS + for [facility] Previous Attempts/Gestures: Yes How many times?: 1 (Tried to cut wrist 5 mos. ago; fiance stopped her) Other Self Harm Risks: AH w/ command to harm others Triggers for Past Attempts: Other (Comment) (Father of one of her children tried to take custody.) Intentional Self Injurious Behavior: None Family Suicide History: No (Mother: unspecified mental illness) Recent stressful life event(s): Job Loss;Financial Problems;Other (Comment) (Living w/ fiance's family; 3 of her children w/ family.) Persecutory voices/beliefs?: Yes (AH telling pt she is going to die) Depression: Yes Depression Symptoms: Insomnia;Fatigue;Feeling worthless/self pity;Feeling angry/irritable (  Hopelessness) Substance abuse history and/or treatment for substance abuse?: No (Denies, but UDS + for [facility]) Suicide prevention information given to non-admitted patients: Not applicable (Tele-assessment: unable to provide)  Risk to Others Homicidal Ideation: No (Reports wanting to kill the hallucination.) Thoughts of Harm to Others: No Current Homicidal Intent: No Current Homicidal Plan: No Access to Homicidal Means: No Identified Victim: None History of harm to others?: No Assessment of Violence: None Noted Violent Behavior Description: Calm/cooperative Does patient have access to weapons?: Yes (Comment) (No guns, but pt has a pocketknife.) Criminal Charges Pending?: No Does patient have a court date: No  Psychosis Hallucinations: Auditory;Visual;With command (Command to beat people up or "talk shit to that person.") Delusions: None noted  Mental Status Report Appear/Hygiene: Other  (Comment) (Paper scrubs) Eye Contact: Good Motor Activity: Unremarkable Speech: Other (Comment) (Unremarkable) Level of Consciousness: Alert Mood: Depressed Affect: Appropriate to circumstance Anxiety Level: None Thought Processes: Coherent;Relevant Judgement: Unimpaired Orientation: Person;Place;Situation (Time: partial (date off by 1; doesn't know time of day).) Obsessive Compulsive Thoughts/Behaviors: None  Cognitive Functioning Concentration: Decreased Memory: Recent Intact;Remote Intact IQ: Average Insight: Fair Impulse Control: Good Appetite: Good Weight Loss: 0 Weight Gain: 0 Sleep: Decreased Total Hours of Sleep: 4 (Since 03/03/2014; interrupted by AH/VH) Vegetative Symptoms: None  ADLScreening [facility] Assessment Services) Patient's cognitive ability adequate to safely complete daily activities?: Yes Patient able to express need for assistance with ADLs?: Yes Independently performs ADLs?: Yes (appropriate for developmental age)  Prior Inpatient Therapy Prior Inpatient Therapy: No  Prior Outpatient Therapy Prior Outpatient Therapy: Yes Prior Therapy Dates: Past 6 - 7 months: Dashima Schuler @ [facility] for counseling Prior Therapy Facilty/Provider(s): Recently: Unspecified tele-provider for psychiatry  ADL Screening (condition at time of admission) Patient's cognitive ability adequate to safely complete daily activities?: Yes Is the patient deaf or have difficulty hearing?: No Does the patient have difficulty seeing, even when wearing glasses/contacts?: No Does the patient have difficulty concentrating, remembering, or making decisions?: No Patient able to express need for assistance with ADLs?: Yes Does the patient have difficulty dressing or bathing?: No Independently performs ADLs?: Yes (appropriate for developmental age) Does the patient have difficulty walking or climbing stairs?: No Weakness of Legs: None Weakness of Arms/Hands: None  Home Assistive  Devices/Equipment Home Assistive Devices/Equipment: None    Abuse/Neglect Assessment (Assessment to be complete while patient is alone) Physical Abuse: Denies Verbal Abuse: Denies Sexual Abuse: Denies Exploitation of patient/patient's resources: Denies Self-Neglect: Denies Values / Beliefs Cultural Requests During Hospitalization: None Spiritual Requests During Hospitalization: None   Advance Directives (For Healthcare) Advance Directive: Patient does not have advance directive (Tele-assessment: unable to provide packet.) Nutrition Screen- [facility] Adult/[facility] Patient's home diet: Regular  Additional Information 1:1 In Past 12 Months?: No CIRT Risk: No Elopement Risk: No Does patient have medical clearance?: Yes     Disposition:  Disposition Initial Assessment Completed for this Encounter: Yes Disposition of Patient: Inpatient treatment program Type of inpatient treatment program: Adult After consulting with Claudette Head, NP @ 16:05, it has been determined that pt presents a life threatening danger to herself and others for which psychiatric hospitalization is indicated.  Pt accepted to [facility] to the service of Thedore Mins, MD, Rm 402-2.  At 16:07 I spoke to EDP Bethann Berkshire, MD who concurs with opinion.  At 16:09 I spoke to pt's nurse, Amber to notify her.  She agrees to have pt sign Voluntary Admission and Consent for Treatment, to fax the signed form to [facility] at [phone],  to send the original with the pt, and to call nurse-to-nurse report to [phone].  Doylene Canning, MA Triage Specialist Raphael Gibney 03/11/2014 4:22 PM

## 2014-03-11 NOTE — ED Notes (Signed)
I called [facility] to transport [facility] to [facility].

## 2014-03-11 NOTE — Progress Notes (Signed)
Pt asleep, unlabored, even breathing. No signs of distress or complaints at this time. q 15 min safety checks. Pt remains safe on unit 

## 2014-03-11 NOTE — ED Notes (Signed)
[facility].  Faxed Voluntary form to [phone]

## 2014-03-11 NOTE — Progress Notes (Signed)
Patient is a 28 y/o [facility] from [facility] voluntarily due to increasing depression and command hallucinations telling her that she is going to die. Unit policies and procedures reviewed with patient. Patient expressed understanding. Patient stated she was having 7 out of 10 tooth pain, but voiced no other complaints. Patient denied homicidal and suicidal ideations. Patient denied A/V/H. Stated that she had not heard any voices in 5 days. Patient is pleasant and cooperative. Patient oriented to unit. Will continue to monitor.

## 2014-03-12 DIAGNOSIS — F12251 Cannabis dependence with psychotic disorder with hallucinations: Secondary | ICD-10-CM | POA: Diagnosis present

## 2014-03-12 DIAGNOSIS — F323 Major depressive disorder, single episode, severe with psychotic features: Principal | ICD-10-CM | POA: Diagnosis present

## 2014-03-12 LAB — TSH: TSH: 2.099 u[IU]/mL (ref 0.350–4.500)

## 2014-03-12 LAB — HCG, SERUM, QUALITATIVE: Preg, Serum: NEGATIVE

## 2014-03-12 MED ORDER — OLANZAPINE 10 MG PO TBDP: 10.0000 mg | ORAL_TABLET | Freq: Three times a day (TID) | ORAL | Status: DC | PRN

## 2014-03-12 MED ORDER — HALOPERIDOL 2 MG PO TABS
2.0000 mg | ORAL_TABLET | Freq: Every day | ORAL | Status: DC
  Administered 2014-03-12 – 2014-03-17 (×6): 2 mg via ORAL
  Filled 2014-03-12 (×7): qty 1

## 2014-03-12 MED ORDER — NICOTINE 14 MG/24HR TD PT24
14.0000 mg | MEDICATED_PATCH | Freq: Every day | TRANSDERMAL | Status: DC
  Administered 2014-03-12 – 2014-03-15 (×4): 14 mg via TRANSDERMAL
  Filled 2014-03-12 (×8): qty 1

## 2014-03-12 MED ORDER — FLUOXETINE HCL 20 MG PO CAPS
20.0000 mg | ORAL_CAPSULE | Freq: Every day | ORAL | Status: DC
  Administered 2014-03-13 – 2014-03-18 (×6): 20 mg via ORAL
  Filled 2014-03-12 (×7): qty 1

## 2014-03-12 NOTE — H&P (Signed)
Psychiatric Admission Assessment Adult  Patient Identification:  Stephanie Cook Date of Evaluation:  03/12/2014 Chief Complaint:  "I started seeing the face of some guy."  History of Present Illness::  Stephanie Cook is a 28 year old single female who presented to [facility] unaccompanied per the [facility]. Patient has been hearing a voice for the past several months telling her that she is going to die. She reported to counselor that she had recently started to have visual hallucinations. She endorses multiple psychosocial stressors including loss of job last year that led to more financial problems. Patient states today "My therapist was worried about me. I am glad I came for help. I was hearing the voice of someone. Then I saw the guy face to face. That scared me. He had black eyes and not look human. Told me I was going to die. The voice also tells me to kill people but I would not do that. I also would never hurt my children. I miss my kids. My fiance and I are living with family. Some of my other kids are staying with family until I can get a place. 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 I smoke marijuana everyday to cope. I have been having mood swings, crying spells, and get made very easily. I am depressed about my life situation. I usually hear the voices when I am very stressed. I am motivated to live for my four month old daughter. I do not want to hurt myself. I want to get help for my symptoms then get back to my children." Patient is cooperative during her psychiatric assessment today. Stephanie Cook is afraid of having the hallucination of the man again as it scared her.   Elements:  Location:  Psychosis . Quality:  Hearing voices, started to have visual hallucinations . Severity:  Severe . Timing:  Several months. Duration:  Acute. Context:  Recent pregnancy, marijuana abuse, stressors . Associated Signs/Synptoms: Depression Symptoms:  depressed  mood, anhedonia, difficulty concentrating, anxiety, (Hypo) Manic Symptoms:  Elevated Mood, Impulsivity, Anxiety Symptoms:  Excessive Worry, Psychotic Symptoms:  Hallucinations: Auditory Visual PTSD Symptoms: Denies  Total Time spent with patient: 45 minutes  Psychiatric Specialty Exam: Physical Exam  Constitutional:  Physical exam findings reviewed from [facility] on 03/11/14 and I concur with no noted exceptions.   Psychiatric: She is actively hallucinating. She expresses impulsivity. She exhibits a depressed mood.    Review of Systems  Constitutional: Negative.   HENT: Negative.   Eyes: Negative.   Respiratory: Negative.   Cardiovascular: Negative.   Gastrointestinal: Negative.   Genitourinary: Negative.   Musculoskeletal: Negative.   Skin: Negative.   Neurological: Negative.   Endo/Heme/Allergies: Negative.   Psychiatric/Behavioral: Positive for hallucinations and substance abuse. The patient is nervous/anxious.     Blood pressure 122/85, pulse 76, temperature 97.8 F (36.6 C), temperature source Oral, resp. rate 20, height 5\' 2"  (1.575 m), weight 73.936 kg (163 lb), last menstrual period 01/18/2014.Body mass index is 29.81 kg/(m^2).  General Appearance: Fairly Groomed  Patent attorneyye Contact::  Good  Speech:  Clear and Coherent  Volume:  Normal  Mood:  Anxious and Depressed  Affect:  Depressed  Thought Process:  Goal Directed  Orientation:  Full (Time, Place, and Person)  Thought Content:  Hallucinations: Auditory Visual  Suicidal Thoughts:  No  Homicidal Thoughts:  No  Memory:  Immediate;   Fair Recent;   Fair Remote;   Fair  Judgement:  Impaired  Insight:  Shallow  Psychomotor Activity:  Decreased  Concentration:  Fair  Recall:  [employer] of Knowledge:Good  Language: Good  Akathisia:  No  Handed:  Right  AIMS (if indicated):     Assets:  Communication Skills Desire for Improvement Physical Health  Sleep:  Number of Hours: 6.25    Musculoskeletal: Strength &  Muscle Tone: within normal limits Gait & Station: normal Patient leans: N/A  Past Psychiatric History: Diagnosis:Bipolar per patient  Hospitalizations:Denies  Outpatient Care: See Stephanie Cook at [facility] for counseling   Substance Abuse Care: Denies  Self-Mutilation:Thought about cutting self with razor but finance stopped her   Suicidal Attempts:Denies   Violent Behaviors: Denies    Past Medical History:   Past Medical History  Diagnosis Date  . Hypertension   . Preterm labor   . STD (female)     chlamydia, trichomonas, gonorrhea, HPV, HSV  . Pregnant   . Hx of trichomoniasis     recurrent, 4 times  . Hx of chlamydia infection     multiple times  . Hx of gonorrhea   . HSV-2 (herpes simplex virus 2) infection   . Depression    None. Allergies:   Allergies  Allergen Reactions  . Motrin [Ibuprofen] Hives   PTA Medications: Prescriptions prior to admission  Medication Sig Dispense Refill  . escitalopram (LEXAPRO) 20 MG tablet Take 1 tablet (20 mg total) by mouth daily.  30 tablet  3  . medroxyPROGESTERone (DEPO-PROVERA) 150 MG/ML injection Inject 1 mL (150 mg total) into the muscle every 3 (three) months.  1 mL  3  . naproxen sodium (ALEVE) 220 MG tablet Take 220 mg by mouth 2 (two) times daily as needed (tooth ache).      . QUEtiapine (SEROQUEL) 25 MG tablet Take 25 mg by mouth at bedtime.        Previous Psychotropic Medications:  Medication/Dose  Seroquel 25 mg hs               Substance Abuse History in the last 12 months:  yes  Consequences of Substance Abuse: Patient reports smoking one blunt daily for the last eight years with worsening of mental health symptoms.   Social History:  reports that she has been smoking Cigarettes.  She has been smoking about 0.25 packs per day. She has never used smokeless tobacco. She reports that she drinks alcohol. She reports that she uses illicit drugs (Marijuana). Additional Social History:                       Current Place of Residence:   Place of Birth:   Family Members: Marital Status:  "I'm engaged." Children:4  Sons:  Daughters: Relationships: Education:  "I went through the ninth grade."  Educational Problems/Performance: Religious Beliefs/Practices: History of Abuse (Emotional/Phsycial/Sexual) Occupational Experiences; Has worked at [employer] History:  None. Legal History: Marijuana possession in the past  Hobbies/Interests:  Family History:   Family History  Problem Relation Age of Onset  . Mental illness Other   . Hypertension Other   Patient reports that her mother has Bipolar Disorder but is not sure what medications she takes for the condition.   Results for orders placed during the hospital encounter of 03/11/14 (from the past 72 hour(s))  HCG, SERUM, QUALITATIVE     Status: None   Collection Time    03/12/14  6:20 AM      Result Value Ref Range   Preg, Serum NEGATIVE  NEGATIVE   Comment:            THE SENSITIVITY OF THIS     METHODOLOGY IS >10 mIU/mL.     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 Hypertension   . Preterm labor   . STD (female)     chlamydia, trichomonas, gonorrhea, HPV, HSV  . Pregnant   . Hx of trichomoniasis     recurrent, 4 times  . Hx of chlamydia infection     multiple times  . Hx of gonorrhea   . HSV-2 (herpes simplex virus 2) infection   . Depression    AXIS IV:  economic problems, educational problems, housing problems, occupational problems, other  psychosocial or environmental problems, problems related to social environment and problems with primary support group AXIS V:  31-40 impairment in reality testing  Treatment Plan/Recommendations:   1. Admit for crisis management and stabilization. Estimated length of stay 5-7 days. 2. Medication management to reduce current symptoms to base line and improve the patient's level of functioning. Vistaril initiated to help improve sleep. 3. Develop treatment plan to decrease risk of relapse upon discharge of depressive symptoms and the need for readmission. 5. Group therapy to facilitate development of healthy coping skills to use for psychosis.  6. Health care follow up as needed for medical problems. Continue medications to treat high blood pressure including Clonidine 0.1 mg TID and Vasotec 10 mg daily.  7. Discharge plan to include therapy to help patient cope with stressors.  8. Call for Consult with Hospitalist for additional specialty patient services as needed.   Treatment Plan Summary: Daily contact with patient to assess and evaluate symptoms and progress in treatment Medication management Current Medications:  Current Facility-Administered Medications  Medication Dose Route Frequency Provider Last Rate Last Dose  . acetaminophen (TYLENOL) tablet 650 mg  650 mg Oral Q6H PRN Kerry Hough, PA-C   650 mg at 03/11/14 2231  . alum & mag hydroxide-simeth (MAALOX/MYLANTA) 200-200-20 MG/5ML suspension 30 mL  30 mL Oral Q4H PRN Kerry Hough, PA-C      . cloNIDine (CATAPRES) tablet 0.1 mg  0.1 mg Oral TID Kerry Hough, PA-C   0.1 mg at 03/12/14 1201  . enalapril (VASOTEC) tablet 10 mg  10 mg Oral Daily Kerry Hough, PA-C   10 mg at 03/11/14 2232  . [START ON 03/13/2014] FLUoxetine (PROZAC) capsule 20 mg  20 mg Oral Daily Samarrah Tranchina      . haloperidol (HALDOL) tablet 2 mg  2 mg Oral QHS Jaleyah Longhi      . hydrOXYzine (ATARAX/VISTARIL) tablet 50 mg  50 mg Oral QHS PRN Kerry Hough, PA-C      . magnesium hydroxide (MILK OF MAGNESIA) suspension 30 mL  30 mL Oral Daily PRN Kerry Hough, PA-C      . medroxyPROGESTERone (DEPO-PROVERA) injection 150 mg  150 mg Intramuscular Q90 days Kerry Hough, PA-C   150  mg at 03/12/14 1012  . nicotine (NICODERM CQ - dosed in mg/24 hours) patch 14 mg  14 mg Transdermal Q0600 Carrianne Hyun   14 mg at 03/12/14 1108  . OLANZapine zydis (ZYPREXA) disintegrating tablet 10 mg  10 mg Oral Q8H PRN Marleena Shubert        Observation Level/Precautions:  15 minute checks  Laboratory:  CBC Chemistry Profile UDS TSH  Psychotherapy:  Individual and Group Therapy   Medications:  Prozac 20 mg daily for depression, Haldol 2 mg hs for psychosis, Zyprexa Zydis 10 mg every eight hours prn agitation   Consultations:  As needed  Discharge Concerns:  Continued marijuana abuse   Estimated LOS: 5-7 days   Other:     I certify that inpatient services furnished can reasonably be expected to improve the patient's condition.   Fransisca Kaufmann NP-C 3/26/20153:09 PM   Patient seen, evaluated and I agree with notes by Nurse Practitioner. Thedore Mins, MD

## 2014-03-12 NOTE — [facility] Counselor (Signed)
Adult Comprehensive Assessment  Patient ID: Stephanie Cook, female   DOB: 05/26/1986, 28 y.o.   MRN: [id number]  Information Source:    Current Stressors:  Educational / Learning stressors: Yes, 9th grade  Employment / Job issues: Yes, occupational Family Relationships: N/A Financial / Lack of resources (include bankruptcy): Yes, no income  Housing / Lack of housing: Yes, pt would like move out of current home - fiance's grandmother Physical health (include injuries & life threatening diseases): N/A Social relationships: N/A Substance abuse: Yes, pt endorses daily THC use.  1 blunt a day for 8 years.  She obtains the [facility] from her friends.   Bereavement / Loss: N/A   Living/Environment/Situation:  Living Arrangements: Spouse/significant other Living conditions (as described by patient or guardian): Stayed with fiance's grandmother, prior stayed at own house with fiance.  Pt states that 6 members are living in the house.  She shares a room with her fiance, 60 month year old baby, and fiance's grandmother.  Would like to move into own home with fiance and baby.   How long has patient lived in current situation?: 4 months with fiance's grandmother What is atmosphere in current home: Temporary  Family History:  Marital status: Long term relationship Long term relationship, how long?: Almost 2 years  What types of issues is patient dealing with in the relationship?: None  Does patient have children?: Yes How many children?: 4 How is patient's relationship with their children?: 6, 4, 1, and 4 months - "It's good."    Childhood History:  By whom was/is the patient raised?:  (Grandmother only) Additional childhood history information: Grandmom  Description of patient's relationship with caregiver when they were a child: "Good"   Patient's description of current relationship with people who raised him/her: "Good"   Does patient have siblings?: Yes Number of Siblings: 4 Description of  patient's current relationship with siblings: 2 sisters and 2 brothers - "Good"  Did patient suffer any verbal/emotional/physical/sexual abuse as a child?: No Did patient suffer from severe childhood neglect?: No Has patient ever been sexually abused/assaulted/raped as an adolescent or adult?: No Was the patient ever a victim of a crime or a disaster?: No Witnessed domestic violence?: No Has patient been effected by domestic violence as an adult?: No  Education:  Highest grade of school patient has completed: 9th grade  Currently a student?: No Learning disability?: No  Employment/Work Situation:   Employment situation: Unemployed What is the longest time patient has a held a job?: 6 months  Where was the patient employed at that time?: [employer]  Has patient ever been in the [employer]?: No Has patient ever served in Buyer, retail?: No  Financial Resources:   Surveyor, quantity resources: Sales executive;Support from parents / caregiver Does patient have a representative payee or guardian?: No  Alcohol/Substance Abuse:   What has been your use of drugs/alcohol within the last 12 months?: Pt endorses daily THC use.  1 blunt a day for 8 years.  She obtains the [facility] from her friends.   If attempted suicide, did drugs/alcohol play a role in this?: No Alcohol/Substance Abuse Treatment Hx: Denies past history Has alcohol/substance abuse ever caused legal problems?: No  Social Support System:   Patient's Community Support System: Fair Describe Community Support System: Fiance and grandmas  Type of faith/religion: None How does patient's faith help to cope with current illness?: None   Leisure/Recreation:   Leisure and Hobbies: Smoking weed, dancing, and walking with family   Strengths/Needs:  What things does the patient do well?: Cooking and being a mother  In what areas does patient struggle / problems for patient: Money and living situation   Discharge Plan:   Does patient have access to  transportation?: Yes Will patient be returning to same living situation after discharge?: Yes Currently receiving community mental health services: Yes (From Whom) [facility] If no, would patient like referral for services when discharged?: No Does patient have financial barriers related to discharge medications?: Yes Patient description of barriers related to discharge medications: No income   Summary/Recommendations:   Summary and Recommendations (to be completed by the evaluator): [name] is a 28 YO AA female who was referred here by her therapist, [facility]ashima [facility].  She reports hearing voices of a man three times a month, and reports seeing the man a couple of days ago.  She is currently living at fiance's grandmother's home.  Would like to move to her own apartment with fiance and baby.  She is currently unemployed.  Has filed for SSDI and will see a doctor for evaluation.  Does [facility].  Does not see anybody for medication management.  She can benefit from crisis stabilization, therapeutic milieu, medication maangement, and referral for services.     Daryel GeraldNorth, Tonjua Rossetti B. 03/12/2014

## 2014-03-12 NOTE — Tx Team (Signed)
  Interdisciplinary Treatment Plan Update   Date Reviewed:  03/12/2014  Time Reviewed:  8:05 AM  Progress in Treatment:   Attending groups: Yes Participating in groups: No Taking medication as prescribed: Yes  Tolerating medication: Yes Family/Significant other contact made: No  Patient understands diagnosis: Yes  AEB asking for help with depression and hearing voices Discussing patient identified problems/goals with staff: Yes  See initial care plan  Medical problems stabilized or resolved: Yes Denies suicidal/homicidal ideation: Yes  In tx team Patient has not harmed self or others: Yes  For review of initial/current patient goals, please see plan of care.  Estimated Length of Stay:  4-5 days  Reason for Continuation of Hospitalization: Depression Hallucinations Medication stabilization  New Problems/Goals identified:  N/A  Discharge Plan or Barriers:  return home, follow up outpt.   Additional Comments:  Pt reports that she has been experiencing AH of a voice telling her she is going to die for the past 6 - 7 months. It will occasionally command her to beat people up, or to "talk shit to that person." However, she does not comply with the command. She reports that in the past couple days she has experienced VH of a man that accompanied the AH. Pt denies any history of inpatient treatment. She started seeing [facility] 6 - 7 months [facility], [facility]. She has also seen an unspecified tele-provider for psychiatry who prescribed Lexapro and Seroquel for the pt. However, pt did not think it was working, and on 03/03/2014 she stopped taking it.   Attendees:  Signature: Thedore MinsMojeed Akintayo, MD 03/12/2014 8:05 AM   Signature: Richelle Itood Kyana Aicher, LCSW 03/12/2014 8:05 AM  Signature: Fransisca KaufmannLaura Davis, NP 03/12/2014 8:05 AM  Signature: Joslyn Devonaroline Beaudry, RN 03/12/2014 8:05 AM  Signature: Liborio NixonPatrice White, RN 03/12/2014 8:05 AM  Signature:  03/12/2014 8:05 AM  Signature:    03/12/2014 8:05 AM  Signature:    Signature:    Signature:    Signature:    Signature:    Signature:      Scribe for Treatment Team:   Richelle Itood Sung [place], LCSW  03/12/2014 8:05 AM

## 2014-03-12 NOTE — Progress Notes (Signed)
The focus of this group is to educate the patient on the purpose and policies of crisis stabilization and provide a format to answer questions about their admission.  The group details unit policies and expectations of patients while admitted.  Patient attended 0900 nurse education orientation group this morning.  Patient actively participated, appropriate affect, alert, appropriate insight and engagement.  Today patient will work on 3 goals for discharge.  

## 2014-03-12 NOTE — [facility] Group Notes (Signed)
[facility] Group Notes:  (Counselor/Nursing/MHT/Case Management/Adjunct)  03/12/2014 1:15PM  Type of Therapy:  Group Therapy  Participation Level:  Active  Participation Quality:  Appropriate  Affect:  Flat  Cognitive:  Oriented  Insight:  Improving  Engagement in Group:  Limited  Engagement in Therapy:  Limited  Modes of Intervention:  Discussion, Exploration and Socialization  Summary of Progress/Problems: The topic for group was balance in life.  Pt participated in the discussion about when their life was in balance and out of balance and how this feels.  Pt discussed ways to get back in balance and short term goals they can work on to get where they want to be. Stephanie Cook sat with quietly with her head down throughout group.  She declined to respond even when asked directly.  She stated that she feels tired from the medication.   Daryel Geraldorth, [name] 03/12/2014 1:41 PM

## 2014-03-12 NOTE — [facility] Suicide Risk Assessment (Signed)
   Nursing information obtained from:    Demographic factors:    Current Mental Status:    Loss Factors:    Historical Factors:    Risk Reduction Factors:    Total Time spent with patient: 20 minutes  CLINICAL FACTORS:   Severe Anxiety and/or Agitation Depression:   Aggression Anhedonia Comorbid alcohol abuse/dependence Hopelessness Impulsivity Insomnia Alcohol/Substance Abuse/Dependencies Currently Psychotic  Psychiatric Specialty Exam: Physical Exam  Psychiatric: Her speech is normal. Thought content normal. She is actively hallucinating. Cognition and memory are normal. She expresses impulsivity. She exhibits a depressed mood.    Review of Systems  Constitutional: Negative.   HENT: Negative.   Eyes: Negative.   Respiratory: Negative.   Cardiovascular: Negative.   Gastrointestinal: Negative.   Genitourinary: Negative.   Musculoskeletal: Negative.   Skin: Negative.   Neurological: Negative.   Endo/Heme/Allergies: Negative.   Psychiatric/Behavioral: Positive for depression, hallucinations and substance abuse. The patient is nervous/anxious and has insomnia.     Blood pressure 141/102, pulse 79, temperature 97.8 F (36.6 C), temperature source Oral, resp. rate 20, height 5\' 2"  (1.575 m), weight 73.936 kg (163 lb), last menstrual period 01/18/2014.Body mass index is 29.81 kg/(m^2).  General Appearance: Fairly Groomed  Patent attorneyye Contact::  Good  Speech:  Clear and Coherent  Volume:  Normal  Mood:  Anxious and Depressed  Affect:  Depressed  Thought Process:  Goal Directed  Orientation:  Full (Time, Place, and Person)  Thought Content:  Hallucinations: Auditory Visual  Suicidal Thoughts:  No  Homicidal Thoughts:  No  Memory:  Immediate;   Fair Recent;   Fair Remote;   Fair  Judgement:  Impaired  Insight:  Shallow  Psychomotor Activity:  Decreased  Concentration:  Fair  Recall:  [employer]  Fund of Knowledge:Good  Language: Good  Akathisia:  No  Handed:  Right  AIMS (if  indicated):     Assets:  Communication Skills Desire for Improvement Physical Health  Sleep:  Number of Hours: 6.25   Musculoskeletal: Strength & Muscle Tone: within normal limits Gait & Station: normal Patient leans: N/A  COGNITIVE FEATURES THAT CONTRIBUTE TO RISK:  Closed-mindedness Polarized thinking    SUICIDE RISK:   Minimal: No identifiable suicidal ideation.  Patients presenting with no risk factors but with morbid ruminations; may be classified as minimal risk based on the severity of the depressive symptoms  PLAN OF CARE:1. Admit for crisis management and stabilization. 2. Medication management to reduce current symptoms to base line and improve the     patient's overall level of functioning 3. Treat health problems as indicated. 4. Develop treatment plan to decrease risk of relapse upon discharge and the need for     readmission. 5. Psycho-social education regarding relapse prevention and self care. 6. Health care follow up as needed for medical problems. 7. Restart home medications where appropriate.   I certify that inpatient services furnished can reasonably be expected to improve the patient's condition.  Sonoma Firkus,MD 03/12/2014, 11:08 AM

## 2014-03-12 NOTE — Progress Notes (Signed)
D:  Patient's self inventory sheet, patient needs sleep medication, good appetite, normal energy level, good attention span.  Rated depression and hopeless 1.  Denied withdrawals.  Denied physical problems.  Worst pain #3 in past 24 hours, pain goal today 3.  After discharge, plans to take medication and not stop taking meds.  Does have discharge plans.  No problems taking meds after discharge. A:  Medications administered per MD orders.  Emotional support and encouragement given patient. R:  Denied SI and HI, contracts for safety.  Denied A/V hallucinations.  Will continue to monitor patient for safety with 15 minute checks.  Safety maintained.

## 2014-03-13 DIAGNOSIS — F19951 Other psychoactive substance use, unspecified with psychoactive substance-induced psychotic disorder with hallucinations: Secondary | ICD-10-CM

## 2014-03-13 MED ORDER — ENALAPRIL MALEATE 20 MG PO TABS
20.0000 mg | ORAL_TABLET | Freq: Every day | ORAL | Status: DC
  Administered 2014-03-14 – 2014-03-18 (×5): 20 mg via ORAL
  Filled 2014-03-13 (×6): qty 1

## 2014-03-13 NOTE — Progress Notes (Signed)
Adult Psychoeducational Group Note  Date:  03/13/2014 Time:  10:30 AM  Group Topic/Focus:  Early Warning Signs:   The focus of this group is to help patients identify signs or symptoms they exhibit before slipping into an unhealthy state or crisis.  Participation Level:  Did Not Attend  Additional Comments:  Pt was in bed asleep.  Stephanie Cook, Stephanie Cook 03/13/2014, 10:30 AM

## 2014-03-13 NOTE — [facility] Suicide Risk Assessment (Signed)
[facility] INPATIENT:  Family/Significant Other Suicide Prevention Education  Suicide Prevention Education:  Education Completed; No one has been identified by the patient as the family member/significant other with whom the patient will be residing, and identified as the person(s) who will aid the patient in the event of a mental health crisis (suicidal ideations/suicide attempt).  With written consent from the patient, the family member/significant other has been provided the following suicide prevention education, prior to the and/or following the discharge of the patient.  The suicide prevention education provided includes the following:  Suicide risk factors  Suicide prevention and interventions  National Suicide Hotline telephone number  [facility] assessment telephone number  [place] Emergency Assistance 911  County and/or Residential Mobile Crisis Unit telephone number  Request made of family/significant other to:  Remove weapons (e.g., guns, rifles, knives), all items previously/currently identified as safety concern.    Remove drugs/medications (over-the-counter, prescriptions, illicit drugs), all items previously/currently identified as a safety concern.  The family member/significant other verbalizes understanding of the suicide prevention education information provided.  The family member/significant other agrees to remove the items of safety concern listed above.  The patient did not endorse SI at the time of admission, nor did the patient c/o SI during the stay here.  SPE not required.     Jamall Strohmeier 03/13/2014, 3:22 PM 

## 2014-03-13 NOTE — [facility] Group Notes (Signed)
[facility] LCSW Group Therapy  03/13/2014 12:30 PM  Type of Therapy:  Group Therapy   Participation Level:  Active  Participation Quality:  Appropriate  Affect:  Appropriate  Cognitive:  Appropriate  Insight:  Improving  Engagement in Therapy:  Engaged  Modes of Intervention:  Discussion and Socialization   Summary of Progress/Problems:  Chaplain was here to lead a group on themes of hope and courage. Stephanie Cook defined hope as wanting to achieve something.  Her hope is to own a house so that all of her children can live in one household. Her current housing situation does not allow all of her children to live with her because there are already too many people at the house.  When asked what is her next goal after d/c, she stated she would like to get a job.  One of her ultimate goals is to become a nurse; however, she does not have a GED.    She shared that she was working on her GED when she was pregnant, but had to stop because her baby arrived early.  She is unable to concentrate on her GED because of the baby and she has no reliable transportation.    [name]   03/13/2014  12:30 PM

## 2014-03-13 NOTE — Progress Notes (Signed)
Patient ID: Stephanie Cook, female   DOB: 29-Jan-1986, 28 y.o.   MRN: [id number]  D: Patient was pleasant on approach this am. Reports her mood is much improved and gives depression a "4" on scale and hopelessness "1". Reports she knows that she has only been here since Wednesday but feels ready to go. States that she misses her 644 month old baby. Denies any SI, HI or a/v hallucinations at present A: Staff will monitor on q 15 minute checks, follow treatment plan, and give meds. R: Cooperative on unit.

## 2014-03-13 NOTE — Progress Notes (Signed)
Patient ID: Stephanie Cook, female   DOB: 10-13-1986, 28 y.o.   MRN: [id number] D: Patient stated she is doing well. Pt reports decreased anxiety and depressive symptoms. Pt reports missing her 49 month old child. Pt reports the importance of taking your medication on a regular basics. Pt reports she is unable to hear the "voices" anymore. Pt denies SI/HI/AVH. Cooperative with assessment. No acute distressed noted at this time.   A: Met with pt 1:1. Medications administered as prescribed. Writer encouraged pt to discuss feelings. Pt encouraged to come to staff with any questions or concerns.   R: Patient is safe on the unit. She is complaint with medications and denies any adverse reaction. Continue current POC.

## 2014-03-13 NOTE — Progress Notes (Signed)
[facility] MD Progress Note  03/13/2014 12:01 PM Stephanie Cook  MRN:  [id number] Subjective:   Patient states "I did not sleep well last night. I was up worrying about my baby. I am not hearing the voices like I was anymore. Just feeling a little depressed. I don't want to see that man again."   Objective:  Patient is visible on the unit and attending the scheduled groups. She reports a decrease in depression and psychosis since being started on her new medications. Patient continues to be concerned about her financial and housing problems. She admits that her numerous stressors prompt her to hear voices. Patient relies on family for financial support and for help raising her children. She is compliant with her medication does not endorse adverse effects. Patient also has a heavy dependence on marijuana, which contributes to her psychotic symptoms.   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  Hypertension    .  Preterm labor    .  STD (female)      chlamydia, trichomonas, gonorrhea, HPV, HSV   .  Pregnant    .  Hx of trichomoniasis      recurrent, 4 times   .  Hx of chlamydia infection      multiple times   .  Hx of gonorrhea    .  HSV-2 (herpes simplex virus 2) infection    .  Depression     AXIS IV: economic problems, educational problems, housing problems, occupational problems, other psychosocial or environmental problems, problems related to social environment and problems with primary support group  AXIS V: 31-40 impairment in reality testing  ADL's:  Intact  Sleep: Good  Appetite:  Fair  Suicidal Ideation:  Denies Homicidal Ideation:  Denies  AEB (as evidenced by):  Psychiatric Specialty Exam: Physical Exam  Review  of Systems  Constitutional: Negative.   HENT: Negative.   Eyes: Negative.   Respiratory: Negative.   Cardiovascular: Negative.   Gastrointestinal: Negative.   Genitourinary: Negative.   Musculoskeletal: Negative.   Skin: Negative.   Neurological: Negative.   Endo/Heme/Allergies: Negative.   Psychiatric/Behavioral: Positive for depression, hallucinations and substance abuse. Negative for suicidal ideas and memory loss. The patient is nervous/anxious. The patient does not have insomnia.     Blood pressure 142/103, pulse 76, temperature 98.2 F (36.8 C), temperature source Oral, resp. rate 20, height 5\' 2"  (1.575 m), weight 73.936 kg (163 lb), last menstrual period 01/18/2014.Body mass index is 29.81 kg/(m^2).  General Appearance: Casual  Eye Contact::  Good  Speech:  Clear and Coherent  Volume:  Normal  Mood:  Anxious  Affect:  Full Range  Thought Process:  Irrelevant  Orientation:  Full (Time, Place, and Person)  Thought Content:  Delusions and Hallucinations: Auditory  Suicidal Thoughts:  No  Homicidal Thoughts:  No  Memory:  Immediate;   Good Recent;   Good Remote;   Good  Judgement:  Poor  Insight:  Lacking  Psychomotor Activity:  Normal  Concentration:  Fair  Recall:  Fair  Fund of Knowledge:Good  Language: Good  Akathisia:  No  Handed:  Right  AIMS (if indicated):     Assets:  Communication Skills Desire for Improvement Physical Health  Sleep:  Number of  Hours: 6.5   Musculoskeletal: Strength & Muscle Tone: within normal limits Gait & Station: normal Patient leans: N/A  Current Medications: Current Facility-Administered Medications  Medication Dose Route Frequency Provider Last Rate Last Dose  . acetaminophen (TYLENOL) tablet 650 mg  650 mg Oral Q6H PRN Kerry HoughSpencer E Simon, PA-C   650 mg at 03/11/14 2231  . alum & mag hydroxide-simeth (MAALOX/MYLANTA) 200-200-20 MG/5ML suspension 30 mL  30 mL Oral Q4H PRN Kerry HoughSpencer E Simon, PA-C      . cloNIDine (CATAPRES) tablet  0.1 mg  0.1 mg Oral TID Kerry HoughSpencer E Simon, PA-C   0.1 mg at 03/13/14 0824  . enalapril (VASOTEC) tablet 10 mg  10 mg Oral Daily Kerry HoughSpencer E Simon, PA-C   10 mg at 03/13/14 [id number]  . FLUoxetine (PROZAC) capsule 20 mg  20 mg Oral Daily Zamara Cozad   20 mg at 03/13/14 [id number]  . haloperidol (HALDOL) tablet 2 mg  2 mg Oral QHS Constance Hackenberg   2 mg at 03/12/14 2155  . hydrOXYzine (ATARAX/VISTARIL) tablet 50 mg  50 mg Oral QHS PRN Kerry HoughSpencer E Simon, PA-C      . magnesium hydroxide (MILK OF MAGNESIA) suspension 30 mL  30 mL Oral Daily PRN Kerry HoughSpencer E Simon, PA-C      . medroxyPROGESTERone (DEPO-PROVERA) injection 150 mg  150 mg Intramuscular Q90 days Kerry HoughSpencer E Simon, PA-C   150 mg at 03/12/14 1012  . nicotine (NICODERM CQ - dosed in mg/24 hours) patch 14 mg  14 mg Transdermal Q0600 Kaidin Boehle   14 mg at 03/13/14 [id number]  . OLANZapine zydis (ZYPREXA) disintegrating tablet 10 mg  10 mg Oral Q8H PRN Vena Bassinger        Lab Results:  Results for orders placed during the hospital encounter of 03/11/14 (from the past 48 hour(s))  HCG, SERUM, QUALITATIVE     Status: None   Collection Time    03/12/14  6:20 AM      Result Value Ref Range   Preg, Serum NEGATIVE  NEGATIVE   Comment:            THE SENSITIVITY OF THIS     METHODOLOGY IS >10 mIU/mL.     [facility]  TSH     Status: None   Collection Time    03/12/14  6:20 AM      Result Value Ref Range   TSH 2.099  0.350 - 4.500 uIU/mL   Comment: Performed at [facility]    Physical Findings: AIMS: Facial and Oral Movements Muscles of Facial Expression: None, normal Lips and Perioral Area: None, normal Jaw: None, normal Tongue: None, normal,Extremity Movements Upper (arms, wrists, hands, fingers): None, normal Lower (legs, knees, ankles, toes): None, normal, Trunk Movements Neck, shoulders, hips: None, normal, Overall Severity Severity of abnormal movements (highest score from questions above): None,  normal Incapacitation due to abnormal movements: None, normal Patient's awareness of abnormal movements (rate only patient's report): No Awareness, Dental Status Current problems with teeth and/or dentures?: No Does patient usually wear dentures?: No  CIWA:  CIWA-Ar Total: 1 COWS:  COWS Total Score: 1  Treatment Plan Summary: Daily contact with patient to assess and evaluate symptoms and progress in treatment Medication management  Plan: 1. Continue crisis management and stabilization.  2. Medication management: Continue Prozac 20 mg daily for depression, Haldol 2 mg hs for psychosis.  3. Encouraged patient to attend groups and participate in group counseling sessions and activities.  4.  Discharge plan in progress.  5. Continue current treatment plan.  6. Address health issues: Elevated blood pressures. Increase Vasotec to 20 mg daily and continue Clonidine 0.1 mg TID.   Medical Decision Making Problem Points:  Established problem, stable/improving (1), Review of last therapy session (1) and Review of psycho-social stressors (1) Data Points:  Review of medication regiment & side effects (2) Review of new medications or change in dosage (2)  I certify that inpatient services furnished can reasonably be expected to improve the patient's condition.   Fransisca Kaufmann NP-C 03/13/2014, 12:01 PM  Patient seen, evaluated and I agree with notes by Nurse Practitioner. Thedore Mins, MD

## 2014-03-14 DIAGNOSIS — F323 Major depressive disorder, single episode, severe with psychotic features: Principal | ICD-10-CM

## 2014-03-14 DIAGNOSIS — F1220 Cannabis dependence, uncomplicated: Secondary | ICD-10-CM

## 2014-03-14 DIAGNOSIS — F19950 Other psychoactive substance use, unspecified with psychoactive substance-induced psychotic disorder with delusions: Secondary | ICD-10-CM

## 2014-03-14 NOTE — Progress Notes (Signed)
[facility] MD Progress Note  03/14/2014 9:32 AM Stephanie Cook  MRN:  [id number]  Subjective:   Patient states "I did not sleep well last night because I have a roommate but I'm feeling much calmer from the past.  I pain medicine is helping me"   Objective:  Patient seen chart reviewed.  Patient is compliant with her psychotropic medication.  She could not sleep last night because of her roommate but overall she is feeling less paranoid and less complaining of hallucination.  She is participating in the groups.  She admitted noncompliant with medication which was prescribed 7 months ago.  Patient continued to experience paranoia and hallucination but frequency and intensity is less intense than in the past.  She does not have any tremors or shakes.  She denies any agitation or any aggressive behavior.    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  Hypertension    .  Preterm labor    .  STD (female)      chlamydia, trichomonas, gonorrhea, HPV, HSV   .  Pregnant    .  Hx of trichomoniasis      recurrent, 4 times   .  Hx of chlamydia infection      multiple times   .  Hx of gonorrhea    .  HSV-2 (herpes simplex virus 2) infection    .  Depression     AXIS IV: economic problems, educational problems, housing problems, occupational problems, other psychosocial or environmental problems, problems related to social environment and problems with primary support group  AXIS V: 31-40 impairment in reality testing  ADL's:  Intact  Sleep: Good  Appetite:  Fair  Suicidal Ideation:  Denies Homicidal Ideation:  Denies  AEB (as evidenced by):  Psychiatric Specialty Exam: Physical Exam  Review of Systems  Constitutional: Negative.    HENT: Negative.   Eyes: Negative.   Respiratory: Negative.   Cardiovascular: Negative.   Gastrointestinal: Negative.   Genitourinary: Negative.   Musculoskeletal: Negative.   Skin: Negative.   Neurological: Negative.   Endo/Heme/Allergies: Negative.   Psychiatric/Behavioral: Positive for depression, hallucinations and substance abuse. Negative for suicidal ideas and memory loss. The patient is nervous/anxious. The patient does not have insomnia.     Blood pressure 141/96, pulse 102, temperature 98.1 F (36.7 C), temperature source Oral, resp. rate 16, height 5\' 2"  (1.575 m), weight 163 lb (73.936 kg), last menstrual period 01/18/2014.Body mass index is 29.81 kg/(m^2).  General Appearance: Casual  Eye Contact::  Good  Speech:  Clear and Coherent  Volume:  Normal  Mood:  Anxious  Affect:  Full Range  Thought Process:  Irrelevant  Orientation:  Full (Time, Place, and Person)  Thought Content:  Delusions and Hallucinations: Auditory  Suicidal Thoughts:  No  Homicidal Thoughts:  No  Memory:  Immediate;   Good Recent;   Good Remote;   Good  Judgement:  Poor  Insight:  Lacking  Psychomotor Activity:  Normal  Concentration:  Fair  Recall:  Fair  Fund of Knowledge:Good  Language: Good  Akathisia:  No  Handed:  Right  AIMS (if indicated):     Assets:  Communication Skills Desire for Improvement Physical Health  Sleep:  Number of Hours: 6.75  Musculoskeletal: Strength & Muscle Tone: within normal limits Gait & Station: normal Patient leans: N/A  Current Medications: Current Facility-Administered Medications  Medication Dose Route Frequency Provider Last Rate Last Dose  . acetaminophen (TYLENOL) tablet 650 mg  650 mg Oral Q6H PRN Kerry HoughSpencer E Simon, PA-C   650 mg at 03/11/14 2231  . alum & mag hydroxide-simeth (MAALOX/MYLANTA) 200-200-20 MG/5ML suspension 30 mL  30 mL Oral Q4H PRN Kerry HoughSpencer E Simon, PA-C      . cloNIDine (CATAPRES) tablet 0.1 mg  0.1 mg Oral TID Kerry HoughSpencer E  Simon, PA-C   0.1 mg at 03/14/14 0758  . enalapril (VASOTEC) tablet 20 mg  20 mg Oral Daily Fransisca KaufmannLaura Davis, NP   20 mg at 03/14/14 0758  . FLUoxetine (PROZAC) capsule 20 mg  20 mg Oral Daily Mojeed Akintayo   20 mg at 03/14/14 [id number]  . haloperidol (HALDOL) tablet 2 mg  2 mg Oral QHS Mojeed Akintayo   2 mg at 03/13/14 2113  . hydrOXYzine (ATARAX/VISTARIL) tablet 50 mg  50 mg Oral QHS PRN Kerry HoughSpencer E Simon, PA-C      . magnesium hydroxide (MILK OF MAGNESIA) suspension 30 mL  30 mL Oral Daily PRN Kerry HoughSpencer E Simon, PA-C      . medroxyPROGESTERone (DEPO-PROVERA) injection 150 mg  150 mg Intramuscular Q90 days Kerry HoughSpencer E Simon, PA-C   150 mg at 03/12/14 1012  . nicotine (NICODERM CQ - dosed in mg/24 hours) patch 14 mg  14 mg Transdermal Q0600 Mojeed Akintayo   14 mg at 03/14/14 0642  . OLANZapine zydis (ZYPREXA) disintegrating tablet 10 mg  10 mg Oral Q8H PRN Mojeed Akintayo        Lab Results:  No results found for this or any previous visit (from the past 48 hour(s)).  Physical Findings: AIMS: Facial and Oral Movements Muscles of Facial Expression: None, normal Lips and Perioral Area: None, normal Jaw: None, normal Tongue: None, normal,Extremity Movements Upper (arms, wrists, hands, fingers): None, normal Lower (legs, knees, ankles, toes): None, normal, Trunk Movements Neck, shoulders, hips: None, normal, Overall Severity Severity of abnormal movements (highest score from questions above): None, normal Incapacitation due to abnormal movements: None, normal Patient's awareness of abnormal movements (rate only patient's report): No Awareness, Dental Status Current problems with teeth and/or dentures?: No Does patient usually wear dentures?: No  CIWA:  CIWA-Ar Total: 1 COWS:  COWS Total Score: 1  Treatment Plan Summary: Daily contact with patient to assess and evaluate symptoms and progress in treatment Medication management  Plan: 1. Continue crisis management and stabilization.  2. Medication  management: Continue Prozac 20 mg daily for depression, Haldol 2 mg hs for psychosis.  3. Encouraged patient to attend groups and participate in group counseling sessions and activities.  4. Discharge plan in progress.  5. Continue current treatment plan.  6. Address health issues: Elevated blood pressures. Increase Vasotec to 20 mg daily and continue Clonidine 0.1 mg TID.   Medical Decision Making Problem Points:  Established problem, stable/improving (1), Review of last therapy session (1) and Review of psycho-social stressors (1) Data Points:  Review of medication regiment & side effects (2) Review of new medications or change in dosage (2)  I certify that inpatient services furnished can reasonably be expected to improve the patient's condition.   Koula Venier T.  03/14/2014, 9:32 AM
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
## 2014-03-14 NOTE — Progress Notes (Signed)
[facility] Group Notes:  (Nursing/MHT/Case Management/Adjunct)  Date:  03/14/2014  Time:  8:00 p.m.   Type of Therapy:  Psychoeducational Skills  Participation Level:  Active  Participation Quality:  Attentive  Affect:  Blunted  Cognitive:  Lacking  Insight:  Lacking  Engagement in Group:  Improving  Modes of Intervention:  Education  Summary of Progress/Problems: The patient described her day as having been "wonderful". The patient was not able to offer any additional details about her day, but did verbalize that she is ready to be discharged. As a theme for the day, her relapse prevention will involve taking her medication and talking to her partner.   Hazle CocaGOODMAN, Emelda Kohlbeck S 03/14/2014, 9:24 PM

## 2014-03-14 NOTE — Progress Notes (Signed)
D: Took over patient's care @ 0330. Patient in bed sleeping. Respiration regular and unlabored. No sign of distress noted at this time A: 15 mins checks for safety. R: Patient is safe.  

## 2014-03-14 NOTE — Progress Notes (Signed)
D. Pt has been up and has been visible in milieu this evening, pt has been attending and participating in various milieu activities. Pt reports that she is doing well with her medication, has been attending to personal hygiene independently. Pt reports in improvement in hallucinations, pt seen smiling and interacting appropriately with peers. Pt main concern is her inability to sleep well at night, but has received all medications this evening without incident. A. Support and encouragement provided, medication education given. R. Pt verbalized understanding, safety maintained.

## 2014-03-14 NOTE — Progress Notes (Signed)
D   Pt brightens on approach and is cooperatvie   She interacts well with others and is active in groups   She denies suicidal and homicidal ideation   She reports feeling good today and is hopeful to be discharged soon A   Verbal support given   Medications administered and effectiveness monitored   Q 15 min checks R   Pt safe at present

## 2014-03-14 NOTE — Progress Notes (Signed)
Adult Psychoeducational Group Note  Date:  03/14/2014 Time:  6:26 PM  Group Topic/Focus:  Making Healthy Choices:   The focus of this group is to help patients identify negative/unhealthy choices they were using prior to admission and identify positive/healthier coping strategies to replace them upon discharge.  Participation Level:  Active  Participation Quality:  Appropriate, Attentive and Sharing  Affect:  Appropriate  Cognitive:  Alert and Appropriate  Insight: Good  Engagement in Group:  Engaged  Modes of Intervention:  Education and Support  Additional Comments:  Pt participated in group.  Marquis Lunchbrahim, Adeli Frost 03/14/2014, 6:26 PM

## 2014-03-14 NOTE — [facility] Group Notes (Signed)
[facility] Group Notes:  (Nursing/MHT/Case Management/Adjunct)  Date:  03/14/2014  Time: 0910  Type of Therapy:  self inventory  Participation Level:  Active  Participation Quality:  Appropriate  Affect:  Appropriate  Cognitive:  Appropriate  Insight:  Appropriate  Engagement in Group:  Engaged  Modes of Intervention:  Discussion  Summary of Progress/Problems:pt was very guarded in group  Rodman KeyWebb, Phill Steck [facility] 03/14/2014, 10:22 AM

## 2014-03-14 NOTE — Progress Notes (Addendum)
Pt is very upbeat. She stated she did not sleep the patient was moved to a different room. She states she does feel better and is ready for discharge on Monday. Pt states her depression is a 1/10 and her hopelessness is a 1/10. Pt wants to try to take her medications like she is suppose to.Pt denies Si and HI and contracts for safety.Pt has been in the dayroom most of the am visiting with the other pts.

## 2014-03-14 NOTE — [facility] Group Notes (Signed)
[facility] Group Notes:  (Nursing/MHT/Case Management/Adjunct)  Date:  03/14/2014  Time:  0920  Type of Therapy:  Psychoeducational Skills  Participation Level:  Active  Participation Quality:  Appropriate  Affect:  Appropriate  Cognitive:  Appropriate  Insight:  Appropriate  Engagement in Group:  Engaged  Modes of Intervention:  Discussion  Summary of Progress/Problems:Pt participated in group   Rodman KeyWebb, Amiley Shishido [facility] 03/14/2014, 10:28 AM

## 2014-03-15 NOTE — Progress Notes (Signed)
[facility]  03/15/2014 12:04 PM Stephanie Cook  MRN:  [id number]  Subjective:   Sleeping better.  My medicine is working.     Objective:  Patient seen chart reviewed.  Patient is compliant with her psychotropic medication.  She is sleeping better .  She denied any side effects.  She still feels anxious paranoid and continued to endorse hallucinations but they're less intense from the past.  She is going to groups and able to participate in discussions.  Her appetite is okay.    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  Hypertension    .  Preterm labor    .  STD (female)      chlamydia, trichomonas, gonorrhea, HPV, HSV   .  Pregnant    .  Hx of trichomoniasis      recurrent, 4 times   .  Hx of chlamydia infection      multiple times   .  Hx of gonorrhea    .  HSV-2 (herpes simplex virus 2) infection    .  Depression     AXIS IV: economic problems, educational problems, housing problems, occupational problems, other psychosocial or environmental problems, problems related to social environment and problems with primary support group  AXIS V: 31-40 impairment in reality testing  ADL's:  Intact  Sleep: Good  Appetite:  Fair  Suicidal Ideation:  Denies Homicidal Ideation:  Denies  AEB (as evidenced by):  Psychiatric Specialty Exam: Physical Exam  Review of Systems  Psychiatric/Behavioral: Positive for depression and hallucinations. Negative for suicidal ideas and memory loss. The patient is nervous/anxious. The patient does not have insomnia.     Blood pressure 132/87, pulse 81, temperature 98.4 F (36.9 C), temperature source Oral, resp. rate 18, height 5\' 2"  (1.575 m), weight 163 lb (73.936 kg), last  menstrual period 01/18/2014.Body mass index is 29.81 kg/(m^2).  General Appearance: Casual  Eye Contact::  Good  Speech:  Clear and Coherent  Volume:  Normal  Mood:  Anxious  Affect:  Full Range  Thought Process:  Irrelevant  Orientation:  Full (Time, Place, and Person)  Thought Content:  Hallucinations: Auditory and Paranoid Ideation  Suicidal Thoughts:  No  Homicidal Thoughts:  No  Memory:  Immediate;   Good Recent;   Good Remote;   Good  Judgement:  Poor  Insight:  Lacking  Psychomotor Activity:  Normal  Concentration:  Fair  Recall:  Fair  Fund of Knowledge:Good  Language: Good  Akathisia:  No  Handed:  Right  AIMS (if indicated):     Assets:  Communication Skills Desire for Improvement Physical Health  Sleep:  Number of Hours: 6.5   Musculoskeletal: Strength & Muscle Tone: within normal limits Gait & Station: normal Patient leans: N/A  Current Medications: Current Facility-Administered Medications  Medication Dose Route Frequency Provider Last Rate Last Dose  . acetaminophen (TYLENOL) tablet 650 mg  650 mg Oral Q6H PRN Kerry HoughSpencer E Simon, PA-C   650 mg at 03/15/14 1115  . alum & mag hydroxide-simeth (MAALOX/MYLANTA) 200-200-20 MG/5ML suspension 30 mL  30 mL Oral Q4H PRN Kerry HoughSpencer E Simon, PA-C      . cloNIDine (CATAPRES) tablet 0.1 mg  0.1 mg Oral TID Kerry HoughSpencer E Simon,  PA-C   0.1 mg at 03/15/14 0750  . enalapril (VASOTEC) tablet 20 mg  20 mg Oral Daily Fransisca Kaufmann, NP   20 mg at 03/15/14 0751  . FLUoxetine (PROZAC) capsule 20 mg  20 mg Oral Daily Mojeed Akintayo   20 mg at 03/15/14 0751  . haloperidol (HALDOL) tablet 2 mg  2 mg Oral QHS Mojeed Akintayo   2 mg at 03/14/14 2211  . hydrOXYzine (ATARAX/VISTARIL) tablet 50 mg  50 mg Oral QHS PRN Kerry Hough, PA-C      . magnesium hydroxide (MILK OF MAGNESIA) suspension 30 mL  30 mL Oral Daily PRN Kerry Hough, PA-C      . medroxyPROGESTERone (DEPO-PROVERA) injection 150 mg  150 mg Intramuscular Q90 days Kerry Hough,  PA-C   150 mg at 03/12/14 1012  . nicotine (NICODERM CQ - dosed in mg/24 hours) patch 14 mg  14 mg Transdermal Q0600 Mojeed Akintayo   14 mg at 03/15/14 0750  . OLANZapine zydis (ZYPREXA) disintegrating tablet 10 mg  10 mg Oral Q8H PRN Mojeed Akintayo        Lab Results:  No results found for this or any previous visit (from the past 48 hour(s)).  Physical Findings: AIMS: Facial and Oral Movements Muscles of Facial Expression: None, normal Lips and Perioral Area: None, normal Jaw: None, normal Tongue: None, normal,Extremity Movements Upper (arms, wrists, hands, fingers): None, normal Lower (legs, knees, ankles, toes): None, normal, Trunk Movements Neck, shoulders, hips: None, normal, Overall Severity Severity of abnormal movements (highest score from questions above): None, normal Incapacitation due to abnormal movements: None, normal Patient's awareness of abnormal movements (rate only patient's report): No Awareness, Dental Status Current problems with teeth and/or dentures?: No Does patient usually wear dentures?: No  CIWA:  CIWA-Ar Total: 0 COWS:  COWS Total Score: 1  Treatment Plan Summary: Daily contact with patient to assess and evaluate symptoms and progress in treatment Medication management  Plan: 1. Continue crisis management and stabilization.  2. Medication management: Continue Prozac 20 mg daily for depression, Haldol 2 mg hs for psychosis.  3. Encouraged patient to attend groups and participate in group counseling sessions and activities.  4. Discharge plan in progress.  5. Continue current treatment plan.  6. Address health issues: Elevated blood pressures. Increase Vasotec to 20 mg daily and continue Clonidine 0.1 mg TID.   Medical Decision Making Problem Points:  Established problem, stable/improving (1), Review of last therapy session (1) and Review of psycho-social stressors (1) Data Points:  Review of medication regiment & side effects (2) Review of new  medications or change in dosage (2)  I certify that inpatient services furnished can reasonably be expected to improve the patient's condition.   ARFEEN,SYED T.  03/15/2014, 12:04 PM

## 2014-03-15 NOTE — Progress Notes (Signed)
[facility] Group Notes:  (Nursing/MHT/Case Management/Adjunct)  Date:  03/15/2014  Time:  8:00 p.m.   Type of Therapy:  Psychoeducational Skills  Participation Level:  Active  Participation Quality:  Appropriate  Affect:  Appropriate  Cognitive:  Appropriate  Insight:  Improving  Engagement in Group:  Improving  Modes of Intervention:  Education  Summary of Progress/Problems: The patient described her day as having been "great". She indicated that she took a shower today and met some new people from another hallway. In terms of the theme for the day, her support system consists of her fiance and her grandmother.   Nysir Fergusson S 03/15/2014, 10:50 PM

## 2014-03-15 NOTE — Progress Notes (Signed)
Pt has been in the dayroom. She requested a list of all her meds with a print out with information. Informed pt this would be obtained for her. Pt did not go to the gym with the other pts.

## 2014-03-15 NOTE — [facility] Group Notes (Signed)
[facility] LCSW Group Therapy  03/15/2014 1:21 PM  Type of Therapy:  Group Therapy  Participation Level:  None  Participation Quality:  Attentive  Affect:  Depressed and Flat  Cognitive:  Lacking  Insight:  Limited  Engagement in Therapy:  None  Modes of Intervention:  Confrontation, Discussion, Education, Exploration, Problem-solving, Rapport Building, Socialization and Support  Summary of Progress/Problems: Today's group topic was avoiding self sabotage and enabling behaviors. Group members were asked to define self sabotage and enabling and provide examples. Group members were then asked to discuss unhealthy relationships and how to have positive healthy boundaries with those that enable. Group members were asked to process how communicating needs and establishing a plan to change the above identified behavior. [name] was attentive throughout today's therapy group but did not actively participate in group discussion. [name] shows limited progress in the group setting and limited insight AEB her inability to actively participate in group discussion. She was alert and oriented during group and actively listened as others shared.    Smart, Treyana Sturgell LCSWA  03/15/2014, 1:21 PM

## 2014-03-15 NOTE — Progress Notes (Signed)
The focus of this group is to educate the patient on the purpose and policies of crisis stabilization and provide a format to answer questions about their admission.  The group details unit policies and expectations of patients while admitted.  Patient attended [phone] nurse education orientation group this morning.  Patient listened attentively, appropriate affect, alert, appropriate insight and engagement.

## 2014-03-15 NOTE — Progress Notes (Signed)
D:  Patient's self inventory sheet, patient has poor sleep, good appetite, normal energy level, good attention span.  Rated depression and hopeless 1, denied anxiety.  Denied withdrawals.  Denied SI.   Stated her worst pain was #4, pain goal #4.  Plans to take meds after discharge.  "Ready to go home and didn't sleep good last night due to roommate thinking I was going to kill her and tossing and turning."   Does have discharge plans.  No problems taking meds after discharge. A:  Medications administered per MD orders.  Emotional support and encouragement given patient. R:  Denied SI and HI.  Denied A/V hallucinations.  Will continue to monitor patient for safety with 15 minute checks.  Safety maintained.

## 2014-03-15 NOTE — Progress Notes (Signed)
The focus of this group is to educate the patient on the purpose and policies of crisis stabilization and provide a format to answer questions about their admission.  The group details unit policies and expectations of patients while admitted.  Patient attended 0930 psychological healthy support system group this morning.  Patient listened attentively, appropriate affect, alert, appropriate insight and engagement.    

## 2014-03-15 NOTE — Progress Notes (Signed)
Pt observed resting in bed with eyes closed. RR WNL, even and unlabored. Level III obs in place for safety and pt is safe. Stephanie Cook  

## 2014-03-16 MED ORDER — TRAZODONE HCL 50 MG PO TABS
50.0000 mg | ORAL_TABLET | Freq: Every day | ORAL | Status: DC
  Administered 2014-03-17: 50 mg via ORAL
  Filled 2014-03-16 (×3): qty 1

## 2014-03-16 NOTE — Consult Note (Signed)
D: patient states her weekend was "great" and "wonderful."  Patient met with treatment team and was agreeable for a possible discharge tomorrow or Wednesday.  She denies any SI/HI and reports her AH have decreased.  Patient went back to bed this morning and got up a noon.  She was irritable and anxious.  She continues to be compliant with her medications.  A: continue to monitor medication management and MD orders.  Safety checks completed every 15 minutes per protocol.  R: patient is receptive to staff; her behavior is appropriate.

## 2014-03-16 NOTE — Progress Notes (Signed)
Patient ID: Stephanie Cook, female   DOB: 11/28/1986, 28 y.o.   MRN: [id number] [facility] MD Progress Note  03/16/2014 11:09 AM AMELIE CARACCI  MRN:  [id number] Subjective:   Patient states "I am feeling less depressed and hearing voices occasionally.'' Objective:  Patient seen and chart is reviewed. Patient endorsed decrease anxiety, psychosis and depressive symptoms. She reports that she is hearing the voices of a man but no longer seeing him. She expressed concerned about her financial, housing problems and lack of health insurance. Patient relies on family for financial support and for help raising her children. She is compliant with her medication and does not endorse adverse effects. 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  Hypertension    .  Preterm labor    .  STD (female)      chlamydia, trichomonas, gonorrhea, HPV, HSV   .  Pregnant    .  Hx of trichomoniasis      recurrent, 4 times   .  Hx of chlamydia infection      multiple times   .  Hx of gonorrhea    .  HSV-2 (herpes simplex virus 2) infection    .  Depression     AXIS IV: economic problems, educational problems, housing problems, occupational problems, other psychosocial or environmental problems, problems related to social environment and problems with primary support group  AXIS V: 31-40 impairment in reality testing  ADL's:  Intact  Sleep: Good  Appetite:  Fair  Suicidal Ideation:  Denies Homicidal Ideation:  Denies  AEB (as evidenced by):  Psychiatric Specialty Exam: Physical Exam  Review of Systems  Constitutional: Negative.   HENT: Negative.   Eyes: Negative.   Respiratory: Negative.   Cardiovascular: Negative.   Gastrointestinal:  Negative.   Genitourinary: Negative.   Musculoskeletal: Negative.   Skin: Negative.   Neurological: Negative.   Endo/Heme/Allergies: Negative.   Psychiatric/Behavioral: Positive for depression, hallucinations and substance abuse. Negative for suicidal ideas and memory loss. The patient is nervous/anxious. The patient does not have insomnia.     Blood pressure 140/103, pulse 83, temperature 99 F (37.2 C), temperature source Oral, resp. rate 16, height 5\' 2"  (1.575 m), weight 73.936 kg (163 lb), last menstrual period 01/18/2014.Body mass index is 29.81 kg/(m^2).  General Appearance: Casual  Eye Contact::  Good  Speech:  Clear and Coherent  Volume:  Normal  Mood:  Anxious  Affect:  Full Range  Thought Process:  Irrelevant  Orientation:  Full (Time, Place, and Person)  Thought Content:  Delusions and Hallucinations: Auditory  Suicidal Thoughts:  No  Homicidal Thoughts:  No  Memory:  Immediate;   Good Recent;   Good Remote;   Good  Judgement:  Poor  Insight:  Lacking  Psychomotor Activity:  Normal  Concentration:  Fair  Recall:  Fair  Fund of Knowledge:Good  Language: Good  Akathisia:  No  Handed:  Right  AIMS (if indicated):     Assets:  Communication Skills Desire for Improvement Physical Health  Sleep:  Number of Hours: 6.75   Musculoskeletal: Strength & Muscle Tone: within normal limits Gait & Station: normal Patient leans: N/A  Current Medications: Current Facility-Administered Medications  Medication Dose Route Frequency Provider Last Rate Last  Dose  . acetaminophen (TYLENOL) tablet 650 mg  650 mg Oral Q6H PRN Kerry HoughSpencer E Simon, PA-C   650 mg at 03/15/14 1115  . alum & mag hydroxide-simeth (MAALOX/MYLANTA) 200-200-20 MG/5ML suspension 30 mL  30 mL Oral Q4H PRN Kerry HoughSpencer E Simon, PA-C      . cloNIDine (CATAPRES) tablet 0.1 mg  0.1 mg Oral TID Kerry HoughSpencer E Simon, PA-C   0.1 mg at 03/16/14 0816  . enalapril (VASOTEC) tablet 20 mg  20 mg Oral Daily Fransisca KaufmannLaura Davis, NP   20 mg at  03/16/14 0816  . FLUoxetine (PROZAC) capsule 20 mg  20 mg Oral Daily Shivangi Lutz   20 mg at 03/16/14 0816  . haloperidol (HALDOL) tablet 2 mg  2 mg Oral QHS Garyson Stelly   2 mg at 03/15/14 2058  . magnesium hydroxide (MILK OF MAGNESIA) suspension 30 mL  30 mL Oral Daily PRN Kerry HoughSpencer E Simon, PA-C      . medroxyPROGESTERone (DEPO-PROVERA) injection 150 mg  150 mg Intramuscular Q90 days Kerry HoughSpencer E Simon, PA-C   150 mg at 03/12/14 1012  . nicotine (NICODERM CQ - dosed in mg/24 hours) patch 14 mg  14 mg Transdermal Q0600 Quadry Kampa   14 mg at 03/15/14 0750  . OLANZapine zydis (ZYPREXA) disintegrating tablet 10 mg  10 mg Oral Q8H PRN Danel Studzinski      . traZODone (DESYREL) tablet 50 mg  50 mg Oral QHS Katharina Jehle        Lab Results:  No results found for this or any previous visit (from the past 48 hour(s)).  Physical Findings: AIMS: Facial and Oral Movements Muscles of Facial Expression: None, normal Lips and Perioral Area: None, normal Jaw: None, normal Tongue: None, normal,Extremity Movements Upper (arms, wrists, hands, fingers): None, normal Lower (legs, knees, ankles, toes): None, normal, Trunk Movements Neck, shoulders, hips: None, normal, Overall Severity Severity of abnormal movements (highest score from questions above): None, normal Incapacitation due to abnormal movements: None, normal Patient's awareness of abnormal movements (rate only patient's report): No Awareness, Dental Status Current problems with teeth and/or dentures?: No Does patient usually wear dentures?: No  CIWA:  CIWA-Ar Total: 0 COWS:  COWS Total Score: 1  Treatment Plan Summary: Daily contact with patient to assess and evaluate symptoms and progress in treatment Medication management  Plan: 1. Continue crisis management and stabilization.  2. Medication management: - Continue Prozac 20 mg daily for depression. -Continue Haldol 2 mg hs for psychosis.  3. Encouraged patient to attend  groups and participate in group counseling sessions and activities.  4. Discharge plan in progress.  5. Continue current treatment plan.  6. Address health issues: Elevated blood pressures. Increase Vasotec to 20 mg daily and continue Clonidine 0.1 mg TID.   Medical Decision Making Problem Points:  Established problem, improving (1), Review of last therapy session (1) and Review of psycho-social stressors (1) Data Points:  Review of medication regiment & side effects (2) Review of new medications or change in dosage (2)  I certify that inpatient services furnished can reasonably be expected to improve the patient's condition.   Thedore MinsAkintayo, Keegen Heffern, MD 03/16/2014, 11:09 AM

## 2014-03-16 NOTE — Progress Notes (Signed)
Pt resting in bed with eyes closed. RR WNL, even and unlabored. Level III obs in place for safety and pt remains safe. Lawrence MarseillesFriedman, Chidi Shirer Eakes

## 2014-03-16 NOTE — Progress Notes (Signed)
The focus of this group is to help patients review their daily goal of treatment and discuss progress on daily workbooks. Pt attended the evening group session and responded to all discussion prompts from the Writer. Pt shared that today was a good day on the unit, the highlight of which was learning that she would be going home tomorrow. "I'm going to stay on my meds when I go home so that you won't ever have to see me again unless we pass by on the street." Pt's only additional need from Nursing Staff this evening was to do laundry, which was taken care of following group. Pt's affect was bright.

## 2014-03-16 NOTE — [facility] Group Notes (Signed)
[facility] LCSW Group Therapy  03/16/2014 1:15 pm  Type of Therapy: Process Group Therapy  Participation Level:  Minimal  Participation Quality:  Appropriate  Affect:  Flat  Cognitive:  Oriented  Insight:  Improving  Engagement in Group:  Limited  Engagement in Therapy:  Limited  Modes of Intervention:  Activity, Clarification, Education, Problem-solving and Support  Summary of Progress/Problems: Today's group addressed the issue of overcoming obstacles.  Patients were asked to identify their biggest obstacle post d/c that stands in the way of their on-going success, and then problem solve as to how to manage this.  Stephanie Cook was tuned in, but had little to contribute.  Did not share an obstacle, as she could not identify one, but did state that when she is sad or becomes overwhelmed, she listens to [facility]Whitney [place], "and my problems melt away."  Stephanie Cook, Stephanie Cook 03/16/2014   2:27 PM

## 2014-03-16 NOTE — Progress Notes (Signed)
Adult Psychoeducational Group Note  Date:  03/16/2014 Time:  10:00 am  Group Topic/Focus:  Wellness Toolbox:   The focus of this group is to discuss various aspects of wellness, balancing those aspects and exploring ways to increase the ability to experience wellness.  Patients will create a wellness toolbox for use upon discharge.  Participation Level:  Active  Participation Quality:  Appropriate, Sharing and Supportive  Affect:  Appropriate  Cognitive:  Appropriate  Insight: Appropriate  Engagement in Group:  Engaged  Modes of Intervention:  Discussion, Education, Socialization and Support  Additional Comments:  Pt stated that taking walks and listening to good music are her strategies to promote wellness in her personal life. Pt stated that she has no barriers to these activities.   Jahleel Stroschein 03/16/2014, 10:33 AM

## 2014-03-16 NOTE — [facility] Group Notes (Signed)
[facility]   03/16/2014 11:14 AM  Participation Quality:  Engaged  Mood/Affect:  Appropriate  Depression Rating:  denies  Anxiety Rating:  denies  Thoughts of Suicide:  No Will you contract for safety?   NA  Current AVH:  No  Plan for Discharge/Comments:  [name] had no visitors, but talked to family members by phone.  She is ready to go today, so was disappointed when I told her Dr is not planning to d/c until tomorrow.  Continues to exhibit limited insight.  Transportation Means: family  Supports: family  Kiribatiorth, Baldo DaubRodney B

## 2014-03-17 NOTE — [facility] Group Notes (Signed)
[facility] LCSW Group Therapy  03/17/2014 , 2:01 PM   Type of Therapy:  Group Therapy  Participation Level:  Active  Participation Quality:  Attentive  Affect:  Appropriate  Cognitive:  Alert  Insight:  Improving  Engagement in Therapy:  Engaged  Modes of Intervention:  Discussion, Exploration and Socialization  Summary of Progress/Problems: Today's group focused on the term Diagnosis.  Participants were asked to define the term, and then pronounce whether it is a negative, positive or neutral term.  [name] was in a fiesty mood today.  She was planning on leaving today, and was told tomorrow.  However, her participation was appropriate.  She talked about how recovery means that you are done, but then went on to talk about how important it is to take meds as prescribed and to listen to R&Cook to "get my head in a good place."  We used that as teachable moment to talk about the "recovery process."  Stephanie Cook, Stephanie Cook 03/17/2014 , 2:01 PM

## 2014-03-17 NOTE — Tx Team (Signed)
  Interdisciplinary Treatment Plan Update   Date Reviewed:  03/17/2014  Time Reviewed:  8:17 AM  Progress in Treatment:   Attending groups: Yes Participating in groups: Yes Taking medication as prescribed: Yes  Tolerating medication: Yes Family/Significant other contact made: Yes  Patient understands diagnosis: Yes  Discussing patient identified problems/goals with staff: Yes Medical problems stabilized or resolved: Yes Denies suicidal/homicidal ideation: Yes Patient has not harmed self or others: Yes  For review of initial/current patient goals, please see plan of care.  Estimated Length of Stay:  D/C today  Reason for Continuation of Hospitalization:   New Problems/Goals identified:  N/A  Discharge Plan or Barriers:   return home, follow up outpt  Additional Comments:  Attendees:  Signature: Thedore MinsMojeed Akintayo, MD 03/17/2014 8:17 AM   Signature: Richelle Itood Behr Cislo, LCSW 03/17/2014 8:17 AM  Signature: Fransisca KaufmannLaura Davis, NP 03/17/2014 8:17 AM  Signature: Joslyn Devonaroline Beaudry, RN 03/17/2014 8:17 AM  Signature: Liborio NixonPatrice White, RN 03/17/2014 8:17 AM  Signature:  03/17/2014 8:17 AM  Signature:   03/17/2014 8:17 AM  Signature:    Signature:    Signature:    Signature:    Signature:    Signature:      Scribe for Treatment Team:   Richelle Itood Breylin Dom, LCSW  03/17/2014 8:17 AM

## 2014-03-17 NOTE — Progress Notes (Signed)
D   Pt brightens on approach   She interacts well with others and is cooperative and compliant with treatment   She is active in most groups    Her thinking is more logical and coherent and she denies any psychotic symptoms at present A   Verbal support given   Medications administered and effectivness monitored   Q 15 min checks R   Pt safe at present

## 2014-03-17 NOTE — Progress Notes (Signed)
[facility] :  Will you be returning to the same living situation after discharge: Yes,  home At discharge, do you have transportation home?:Yes,  family Do you have the ability to pay for your medications:Yes,  MCD  Release of information consent forms completed and in the chart;  Patient's signature needed at discharge.  Patient to Follow up at: Follow-up Information   [facility]. (Thursday at 11:15 with Deitra MayoErica Flake, then 11:30 with Milana Kidneyishema Shuler)    Contact information:   [address]  [place]  [phone]      Patient denies SI/HI:   Yes,  yes    Safety Planning and Suicide Prevention discussed:  Yes,  yes  Stephanie Rogueorth, Stephanie Cook B 03/17/2014, 8:21 AM

## 2014-03-17 NOTE — [facility] Group Notes (Signed)
Adult Psychoeducational Group Note  Date:  03/17/2014 Time:  8:45 PM  Group Topic/Focus:  Wrap-Up Group:   The focus of this group is to help patients review their daily goal of treatment and discuss progress on daily workbooks.  Participation Level:  Active  Participation Quality:  Appropriate  Affect:  Appropriate  Cognitive:  Appropriate  Insight: Good  Engagement in Group:  Engaged  Modes of Intervention:  Discussion  Additional Comments:  [name] said her day was good and she will be getting discharged tomorrow.  She is looking forward to seeing her babies.  Caroll RancherLindsay, Jewel Venditto A 03/17/2014, 8:45 PM

## 2014-03-17 NOTE — [facility] Group Notes (Signed)
[facility] Group Notes:  (Nursing/MHT/Case Management/Adjunct)  Date:  03/17/2014  Time: 1:15 pm  Type of Therapy:  Psychoeducational Skills  Participation Level:  Active  Participation Quality:  Appropriate  Affect:  Appropriate  Cognitive:  Alert and Appropriate  Insight:  Improving  Engagement in Group:  Limited  Modes of Intervention:  Support  Summary of Progress/Problems:  Cranford MonBeaudry, Nieshia Larmon Evans 03/17/2014, 1:54 PM

## 2014-03-17 NOTE — Progress Notes (Signed)
Patient ID: Stephanie Cook, female   DOB: [birth year], 28 y.o.   MRN: [id number] [facility] MD Progress Note  03/17/2014 11:45 AM Stephanie Cook  MRN:  [id number] Subjective:   Patient states "I am doing much better than when I came in. I don't feel as depressed and I am no longer hearing voices. That happens when I am very stressed. Being here has helped me get my life together."   Objective:  Patient seen and chart is reviewed. Patient endorsed decrease anxiety, psychosis and depressive symptoms. She reports that she is no longer hearing the voice of a man and has not seen him.  She expressed concerned about her financial, housing problems and lack of health insurance. Patient is focused on getting back to care for her family. Patient informed that she is scheduled for discharge tomorrow. She is compliant with her medication and does not endorse adverse effects.  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  Hypertension    .  Preterm labor    .  STD (female)      chlamydia, trichomonas, gonorrhea, HPV, HSV   .  Pregnant    .  Hx of trichomoniasis      recurrent, 4 times   .  Hx of chlamydia infection      multiple times   .  Hx of gonorrhea    .  HSV-2 (herpes simplex virus 2) infection    .  Depression     AXIS IV: economic problems, educational problems, housing problems, occupational problems, other psychosocial or environmental problems, problems related to social environment and problems with primary support group  AXIS V: 51-60 Moderate Symptoms   ADL's:  Intact  Sleep: Good  Appetite:  Fair  Suicidal Ideation:  Denies Homicidal Ideation:  Denies  AEB (as evidenced by):  Psychiatric Specialty Exam: Physical Exam   Review of Systems  Constitutional: Negative.   HENT: Negative.   Eyes: Negative.   Respiratory: Negative.   Cardiovascular: Negative.   Gastrointestinal: Negative.   Genitourinary: Negative.   Musculoskeletal: Negative.   Skin: Negative.   Neurological: Negative.   Endo/Heme/Allergies: Negative.   Psychiatric/Behavioral: Positive for depression and substance abuse. Negative for suicidal ideas, hallucinations and memory loss. The patient is nervous/anxious. The patient does not have insomnia.     Blood pressure 130/96, pulse 80, temperature 97.9 F (36.6 C), temperature source Oral, resp. rate 20, height 5\' 2"  (1.575 m), weight 73.936 kg (163 lb), last menstrual period 01/18/2014.Body mass index is 29.81 kg/(m^2).  General Appearance: Casual  Eye Contact::  Good  Speech:  Clear and Coherent  Volume:  Normal  Mood:  Anxious  Affect:  Full Range  Thought Process:  Goal Directed and Intact  Orientation:  Full (Time, Place, and Person)  Thought Content:  WDL  Suicidal Thoughts:  No  Homicidal Thoughts:  No  Memory:  Immediate;   Good Recent;   Good Remote;   Good  Judgement:  Poor  Insight:  Lacking  Psychomotor Activity:  Normal  Concentration:  Fair  Recall:  Fair  Fund of Knowledge:Good  Language: Good  Akathisia:  No  Handed:  Right  AIMS (if indicated):     Assets:  Communication Skills Desire for Improvement  Physical Health  Sleep:  Number of Hours: 6.25   Musculoskeletal: Strength & Muscle Tone: within normal limits Gait & Station: normal Patient leans: N/A  Current Medications: Current Facility-Administered Medications  Medication Dose Route Frequency Provider Last Rate Last Dose  . acetaminophen (TYLENOL) tablet 650 mg  650 mg Oral Q6H PRN Kerry HoughSpencer E Simon, PA-C   650 mg at 03/15/14 1115  . alum & mag hydroxide-simeth (MAALOX/MYLANTA) 200-200-20 MG/5ML suspension 30 mL  30 mL Oral Q4H PRN Kerry HoughSpencer E Simon, PA-C      . cloNIDine (CATAPRES) tablet 0.1 mg  0.1  mg Oral TID Kerry HoughSpencer E Simon, PA-C   0.1 mg at 03/17/14 [id number]  . enalapril (VASOTEC) tablet 20 mg  20 mg Oral Daily Fransisca KaufmannLaura Davis, NP   20 mg at 03/17/14 [id number]  . FLUoxetine (PROZAC) capsule 20 mg  20 mg Oral Daily Dominyk Law   20 mg at 03/17/14 0814  . haloperidol (HALDOL) tablet 2 mg  2 mg Oral QHS Zoriana Oats   2 mg at 03/16/14 2306  . magnesium hydroxide (MILK OF MAGNESIA) suspension 30 mL  30 mL Oral Daily PRN Kerry HoughSpencer E Simon, PA-C      . medroxyPROGESTERone (DEPO-PROVERA) injection 150 mg  150 mg Intramuscular Q90 days Kerry HoughSpencer E Simon, PA-C   150 mg at 03/12/14 1012  . nicotine (NICODERM CQ - dosed in mg/24 hours) patch 14 mg  14 mg Transdermal Q0600 Kyia Rhude   14 mg at 03/15/14 0750  . OLANZapine zydis (ZYPREXA) disintegrating tablet 10 mg  10 mg Oral Q8H PRN Maveric Debono      . traZODone (DESYREL) tablet 50 mg  50 mg Oral QHS Amia Rynders        Lab Results:  No results found for this or any previous visit (from the past 48 hour(s)).  Physical Findings: AIMS: Facial and Oral Movements Muscles of Facial Expression: None, normal Lips and Perioral Area: None, normal Jaw: None, normal Tongue: None, normal,Extremity Movements Upper (arms, wrists, hands, fingers): None, normal Lower (legs, knees, ankles, toes): None, normal, Trunk Movements Neck, shoulders, hips: None, normal, Overall Severity Severity of abnormal movements (highest score from questions above): None, normal Incapacitation due to abnormal movements: None, normal Patient's awareness of abnormal movements (rate only patient's report): No Awareness, Dental Status Current problems with teeth and/or dentures?: No Does patient usually wear dentures?: No  CIWA:  CIWA-Ar Total: 0 COWS:  COWS Total Score: 1  Treatment Plan Summary: Daily contact with patient to assess and evaluate symptoms and progress in treatment Medication management  Plan: 1. Continue crisis management and stabilization.  2.  Medication management: Reviewed with patient who stated no untoward effects.  - Continue Prozac 20 mg daily for depression. -Continue Haldol 2 mg hs for psychosis.  3. Encouraged patient to attend groups and participate in group counseling sessions and activities.  4. Discharge plan in progress. Anticipate d/c tomorrow.  5. Continue current treatment plan.  6. Address health issues: Elevated blood pressures. Continue Vasotec to 20 mg daily,  Clonidine 0.1 mg TID.   Medical Decision Making Problem Points:  Established problem, improving (1), Review of last therapy session (1) and Review of psycho-social stressors (1) Data Points:  Review of medication regiment & side effects (2)  I certify that inpatient services furnished can reasonably be expected to improve the patient's condition.   Fransisca KaufmannDAVIS, LAURA, NP-C 03/17/2014, 11:45 AM  Patient seen, evaluated and I agree with notes by Nurse Practitioner. Thedore MinsMojeed Kalla Watson, MD

## 2014-03-18 DIAGNOSIS — F1210 Cannabis abuse, uncomplicated: Secondary | ICD-10-CM

## 2014-03-18 MED ORDER — ENALAPRIL MALEATE 20 MG PO TABS: 20.0000 mg | ORAL_TABLET | Freq: Every day | ORAL | Status: DC

## 2014-03-18 MED ORDER — MEDROXYPROGESTERONE ACETATE 150 MG/ML IM SUSP: 150.0000 mg | INTRAMUSCULAR | Status: DC

## 2014-03-18 MED ORDER — TRAZODONE HCL 50 MG PO TABS: 50.0000 mg | ORAL_TABLET | Freq: Every day | ORAL | Status: DC

## 2014-03-18 MED ORDER — CLONIDINE HCL 0.1 MG PO TABS: 0.1000 mg | ORAL_TABLET | Freq: Three times a day (TID) | ORAL | Status: DC

## 2014-03-18 MED ORDER — HALOPERIDOL 2 MG PO TABS: 2.0000 mg | ORAL_TABLET | Freq: Every day | ORAL | Status: DC

## 2014-03-18 MED ORDER — FLUOXETINE HCL 20 MG PO CAPS: 20.0000 mg | ORAL_CAPSULE | Freq: Every day | ORAL | Status: DC

## 2014-03-18 NOTE — Discharge Summary (Signed)
Physician Discharge Summary Note  Patient:  Stephanie Cook is an 28 y.o., female MRN:  [id number] DOB:  03/09/1986 Patient phone:  [phone] (home)  Patient address:   [address] [place] [address],  Total Time spent with patient: 20 minutes  Date of Admission:  03/11/2014 Date of Discharge: 03/18/14  Reason for Admission: Depression, Psychotic symptoms  Discharge Diagnoses: Principal Problem:   Major depressive disorder, single episode, severe, specified as with psychotic behavior Active Problems:   Mood disorder, drug-induced   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 Her speech is normal and behavior is normal. Judgment and thought content normal. Cognition and memory are normal.    Review of Systems  Constitutional: Negative.   HENT: Negative.   Eyes: Negative.   Respiratory: Negative.   Cardiovascular: Negative.   Gastrointestinal: Negative.   Genitourinary: Negative.   Musculoskeletal: Negative.   Skin: Negative.   Neurological: Negative.   Endo/Heme/Allergies: Negative.   Psychiatric/Behavioral: Negative for depression, suicidal ideas, hallucinations, memory loss and substance abuse. The patient is not nervous/anxious and does not have insomnia.     Blood pressure 139/93, pulse 83, temperature 98.5 F (36.9 C), temperature source Oral, resp. rate 22, height 5\' 2"  (1.575 m), weight 73.936 kg (163 lb), last menstrual period 01/18/2014.Body mass index is 29.81 kg/(m^2).  General Appearance: Fairly Groomed  Patent attorneyye Contact::  Good  Speech:  Clear and Coherent and Normal Rate  Volume:  Normal  Mood:  Negative  Affect:  Appropriate and Congruent  Thought Process:  Goal Directed  Orientation:  Full (Time, Place, and Person)  Thought Content:  Negative  Suicidal Thoughts:  No  Homicidal Thoughts:  No  Memory:  Immediate;   Fair Recent;    Fair Remote;   Fair  Judgement:  Fair  Insight:  Fair  Psychomotor Activity:  Normal  Concentration:  Fair  Recall:  FiservFair  Fund of Knowledge:Good  Language: Fair  Akathisia:  No  Handed:  Right  AIMS (if indicated):     Assets:  Communication Skills Desire for Improvement Physical Health  Sleep:  Number of Hours: 6.75    Past Psychiatric History: Diagnosis: Bipolar per patient  Hospitalizations: Denies  [facility]   Substance Abuse Care: Denies  Self-Mutilation: Denies  Suicidal Attempts: Denies  Violent Behaviors: Denies   Musculoskeletal: Strength & Muscle Tone: within normal limits Gait & Station: normal Patient leans: N/A  DSM5: AXIS I: Major depressive disorder, single episode, severe, specified as with psychotic behavior  Cannabis use disorder  AXIS II: Deferred  AXIS III:  Past Medical History   Diagnosis  Date   .  Hypertension    .  Preterm labor    .  STD (female)      chlamydia, trichomonas, gonorrhea, HPV, HSV   .  Pregnant    .  Hx of trichomoniasis      recurrent, 4 times   .  Hx of chlamydia infection      multiple times   .  Hx of gonorrhea    .  HSV-2 (herpes simplex virus 2) infection    .  Depression     AXIS IV: economic problems, other psychosocial or environmental problems and problems related to social environment  AXIS V: 61-70 mild symptoms  Level of Care:  OP  Hospital Course:  Stephanie Cook is a 28 year old single female  who presented to [facility] unaccompanied per the recommendation of her [facility] therapist. Patient has been hearing a voice for the past several months telling her that she is going to die. She reported to counselor that she had recently started to have visual hallucinations. She endorses multiple psychosocial stressors including loss of job last year that led to more financial problems. Patient states today "My therapist was worried about me. I am glad I came for help. I was hearing the voice of  someone. Then I saw the guy face to face. That scared me. He had black eyes and not look human. Told me I was going to die. The voice also tells me to kill people but I would not do that. I also would never hurt my children. I miss my kids. My fiance and I are living with family. Some of my other kids are staying with family until I can get a place. 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 I smoke marijuana everyday to cope. I have been having mood swings, crying spells, and get made very easily. I am depressed about my life situation. I usually hear the voices when I am very stressed. I am motivated to live for my four month old daughter. I do not want to hurt myself. I want to get help for my symptoms then get back to my children." Patient is cooperative during her psychiatric assessment today. Stephanie Cook is afraid of having the hallucination of the man again as it scared her.   Patient was admitted to the 400 hall for further assessment and medication management. She was started on Prozac 20 mg daily for depressive symptoms and Haldol 2 mg at hs for psychotic symptoms. Patient was compliant with her scheduled medications. There were no behavioral problems noted during her admission. Patient interacted well with peers and attended the scheduled groups. She began to report a significant decrease in her symptoms. Patient reported feeling less depression with only occasional auditory hallucinations. She reported missing her four month old child and was eager to return home. Patient worked to develop coping skills to reduce her levels of stress. She reported that becoming overly stressed triggered her psychotic episodes. Stephanie Cook reported during group that listening to music was one of the ways that helped her. Patient was found stable for discharge by the treatment team. She was provided with prescriptions and sample medications. Patient's elevated blood pressure was treated with Clonidine 0.1 mg TID along with Vasotec 20 mg  daily. On the day of discharge the patient adamantly denied SI/HI/AVH. She left [facility] in stable condition with all belongings returned.   Consults:  None  Significant Diagnostic Studies:  Admission labs reviewed   Discharge Vitals:   Blood pressure 139/93, pulse 83, temperature 98.5 F (36.9 C), temperature source Oral, resp. rate 22, height 5\' 2"  (1.575 m), weight 73.936 kg (163 lb), last menstrual period 01/18/2014. Body mass index is 29.81 kg/(m^2). Lab Results:   No results found for this or any previous visit (from the past 72 hour(s)).  Physical Findings: AIMS: Facial and Oral Movements Muscles of Facial Expression: None, normal Lips and Perioral Area: None, normal Jaw: None, normal Tongue: None, normal,Extremity Movements Upper (arms, wrists, hands, fingers): None, normal Lower (legs, knees, ankles, toes): None, normal, Trunk Movements Neck, shoulders, hips: None, normal, Overall Severity Severity of abnormal movements (highest score from questions above): None, normal Incapacitation due to abnormal movements: None, normal Patient's awareness of abnormal movements (rate only patient's report): No Awareness, Dental Status  Current problems with teeth and/or dentures?: No Does patient usually wear dentures?: No  CIWA:  CIWA-Ar Total: 0 COWS:  COWS Total Score: 1  Psychiatric Specialty Exam: See Psychiatric Specialty Exam and Suicide Risk Assessment completed by Attending Physician prior to discharge.  Discharge destination:  Home  Is patient on multiple antipsychotic therapies at discharge:  No   Has Patient had three or more failed trials of antipsychotic monotherapy by history:  No  Recommended Plan for Multiple Antipsychotic Therapies: NA  Discharge Orders   Future Orders Complete By Expires   Discharge instructions  As directed    Comments:     Please follow up with your Primary Care Provider for further management of your medical problems. You were given  prescriptions for Clonidine and Vasotec for thirty days. Your blood pressure was noted to be elevated during your admission.       Medication List    STOP taking these medications       escitalopram 20 MG tablet  Commonly known as:  LEXAPRO     QUEtiapine 25 MG tablet  Commonly known as:  SEROQUEL      TAKE these medications     Indication   ALEVE 220 MG tablet  Generic drug:  naproxen sodium  Take 220 mg by mouth 2 (two) times daily as needed (tooth ache).      cloNIDine 0.1 MG tablet  Commonly known as:  CATAPRES  Take 1 tablet (0.1 mg total) by mouth 3 (three) times daily.   Indication:  High Blood Pressure     enalapril 20 MG tablet  Commonly known as:  VASOTEC  Take 1 tablet (20 mg total) by mouth daily.   Indication:  High Blood Pressure     FLUoxetine 20 MG capsule  Commonly known as:  PROZAC  Take 1 capsule (20 mg total) by mouth daily.   Indication:  Depression     haloperidol 2 MG tablet  Commonly known as:  HALDOL  Take 1 tablet (2 mg total) by mouth at bedtime.   Indication:  Psychosis     medroxyPROGESTERone 150 MG/ML injection  Commonly known as:  DEPO-PROVERA  Inject 1 mL (150 mg total) into the muscle every 3 (three) months.   Indication:  Pregnancy     traZODone 50 MG tablet  Commonly known as:  DESYREL  Take 1 tablet (50 mg total) by mouth at bedtime.   Indication:  Trouble Sleeping           Follow-up Information   Follow up with [facility] On 03/26/2014. (Thursday at 11:15 with Deitra Mayo, then 11:30 with Milana Kidney)    Contact information:   [address]  [place]  [phone]      Follow-up recommendations:   Activity: as tolerated  Diet: healthy  Tests: routine  Other: patient to keep her after care appointment   Comments:   Take all your medications as prescribed by your mental healthcare provider.  Report any adverse effects and or reactions from your medicines to your outpatient provider promptly.  Patient is  instructed and cautioned to not engage in alcohol and or illegal drug use while on prescription medicines.  In the event of worsening symptoms, patient is instructed to call the crisis hotline, 911 and or go to the nearest ED for appropriate evaluation and treatment of symptoms.  Follow-up with your primary care provider for your other medical issues, concerns and or health care needs.   Total  Discharge Time:  Greater than 30 minutes.  SignedFransisca Kaufmann NP-C 03/18/2014, 10:09 AM  Patient seen, evaluated and I agree with notes by Nurse Practitioner. Thedore Mins, MD

## 2014-03-18 NOTE — [facility] Suicide Risk Assessment (Signed)
Demographic Factors:  Low socioeconomic status, Unemployed and female  Total Time spent with patient: 20 minutes  Psychiatric Specialty Exam: Physical Exam  Psychiatric: She has a normal mood and affect. Her speech is normal and behavior is normal. Judgment and thought content normal. Cognition and memory are normal.    Review of Systems  Constitutional: Negative.   HENT: Negative.   Eyes: Negative.   Respiratory: Negative.   Cardiovascular: Negative.   Gastrointestinal: Negative.   Genitourinary: Negative.   Musculoskeletal: Negative.   Skin: Negative.   Neurological: Negative.   Endo/Heme/Allergies: Negative.   Psychiatric/Behavioral: Negative.     Blood pressure 139/93, pulse 83, temperature 98.5 F (36.9 C), temperature source Oral, resp. rate 22, height 5\' 2"  (1.575 m), weight 73.936 kg (163 lb), last menstrual period 01/18/2014.Body mass index is 29.81 kg/(m^2).  General Appearance: Fairly Groomed  Patent attorneyye Contact::  Good  Speech:  Clear and Coherent and Normal Rate  Volume:  Normal  Mood:  Negative  Affect:  Appropriate and Congruent  Thought Process:  Goal Directed  Orientation:  Full (Time, Place, and Person)  Thought Content:  Negative  Suicidal Thoughts:  No  Homicidal Thoughts:  No  Memory:  Immediate;   Fair Recent;   Fair Remote;   Fair  Judgement:  Fair  Insight:  Fair  Psychomotor Activity:  Normal  Concentration:  Fair  Recall:  FiservFair  Fund of Knowledge:Good  Language: Fair  Akathisia:  No  Handed:  Right  AIMS (if indicated):     Assets:  Communication Skills Desire for Improvement Physical Health  Sleep:  Number of Hours: 6.75    Musculoskeletal: Strength & Muscle Tone: within normal limits Gait & Station: normal Patient leans: N/A   Mental Status Per Nursing Assessment::   On Admission:     Current Mental Status by Physician: patient denies suicidal ideation, intent or plan  Loss Factors: Financial problems/change in socioeconomic  status  Historical Factors: Impulsivity  Risk Reduction Factors:   Living with another person, especially a relative and Positive social support  Continued Clinical Symptoms:  Alcohol/Substance Abuse/Dependencies  Cognitive Features That Contribute To Risk:  Closed-mindedness Polarized thinking    Suicide Risk:  Minimal: No identifiable suicidal ideation.  Patients presenting with no risk factors but with morbid ruminations; may be classified as minimal risk based on the severity of the depressive symptoms  Discharge Diagnoses:   AXIS I:  Major depressive disorder, single episode, severe, specified as with psychotic behavior              Cannabis use disorder AXIS II:  Deferred AXIS III:   Past Medical History  Diagnosis Date  . Hypertension   . Preterm labor   . STD (female)     chlamydia, trichomonas, gonorrhea, HPV, HSV  . Pregnant   . Hx of trichomoniasis     recurrent, 4 times  . Hx of chlamydia infection     multiple times  . Hx of gonorrhea   . HSV-2 (herpes simplex virus 2) infection   . Depression    AXIS IV:  economic problems, other psychosocial or environmental problems and problems related to social environment AXIS V:  61-70 mild symptoms  Plan Of Care/Follow-up recommendations:  Activity:  as tolerated Diet:  healthy Tests:  routine Other:  patient to keep her after care appointment  Is patient on multiple antipsychotic therapies at discharge:  No   Has Patient had three or more failed trials of antipsychotic monotherapy  by history:  No  Recommended Plan for Multiple Antipsychotic Therapies: NA    Thedore Mins, MD 03/18/2014, 10:14 AM

## 2014-03-18 NOTE — Progress Notes (Signed)
Patient ID: Stephanie McgregorBrittany D Maddix, female   DOB: [birth year], 28 y.o.   MRN: [id number] D: patient discharged home per MD order.  Patient received prescriptions and personal belongings.  Reviewed discharge instructions and medication with patient and she indicated understanding.  She denies any SI/HI/AVH.  Patient left ambulatory with her aunt.

## 2014-03-18 NOTE — Progress Notes (Signed)
D   Pt is pleasant and appropriate  She reports looking forward to being discharged tomorrow   She said she cant wait to see her baby  She reports feeling so much better than when she came in    A   Verbal support given   Medications administered and effectiveness monitored   Q 15 min checks R   Pt safe at present

## 2014-03-19 NOTE — Progress Notes (Signed)
Patient Discharge Instructions:  After Visit Summary (AVS):   Faxed to:  03/19/14 Discharge Summary Note:   Faxed to:  03/19/14 Psychiatric Admission Assessment Note:   Faxed to:  03/19/14 Suicide Risk Assessment - Discharge Assessment:   Faxed to:  03/19/14 Faxed/Sent to the Next Level Care provider:  03/19/14 [facility] [phone]  Jerelene ReddenSheena E Enoree, 03/19/2014, 3:56 PM

## 2014-06-07 ENCOUNTER — Emergency Department (HOSPITAL_COMMUNITY)
Admission: EM | Admit: 2014-06-07 | Discharge: 2014-06-07 | Disposition: A | Discharge status: Home / Self Care | Payer: Medicaid Other | Attending: Emergency Medicine | Admitting: Emergency Medicine

## 2014-06-07 ENCOUNTER — Encounter (HOSPITAL_COMMUNITY): Payer: Self-pay | Admitting: Emergency Medicine

## 2014-06-07 ENCOUNTER — Emergency Department (HOSPITAL_COMMUNITY): Payer: Medicaid Other

## 2014-06-07 DIAGNOSIS — N949 Unspecified condition associated with female genital organs and menstrual cycle: Secondary | ICD-10-CM | POA: Insufficient documentation

## 2014-06-07 DIAGNOSIS — K8020 Calculus of gallbladder without cholecystitis without obstruction: Secondary | ICD-10-CM | POA: Insufficient documentation

## 2014-06-07 DIAGNOSIS — I10 Essential (primary) hypertension: Secondary | ICD-10-CM | POA: Insufficient documentation

## 2014-06-07 DIAGNOSIS — R102 Pelvic and perineal pain: Secondary | ICD-10-CM

## 2014-06-07 DIAGNOSIS — B9689 Other specified bacterial agents as the cause of diseases classified elsewhere: Secondary | ICD-10-CM | POA: Insufficient documentation

## 2014-06-07 DIAGNOSIS — Z3202 Encounter for pregnancy test, result negative: Secondary | ICD-10-CM | POA: Insufficient documentation

## 2014-06-07 DIAGNOSIS — F329 Major depressive disorder, single episode, unspecified: Secondary | ICD-10-CM | POA: Insufficient documentation

## 2014-06-07 DIAGNOSIS — R1031 Right lower quadrant pain: Secondary | ICD-10-CM | POA: Insufficient documentation

## 2014-06-07 DIAGNOSIS — Z8751 Personal history of pre-term labor: Secondary | ICD-10-CM | POA: Insufficient documentation

## 2014-06-07 DIAGNOSIS — F3289 Other specified depressive episodes: Secondary | ICD-10-CM | POA: Insufficient documentation

## 2014-06-07 DIAGNOSIS — N760 Acute vaginitis: Secondary | ICD-10-CM | POA: Insufficient documentation

## 2014-06-07 DIAGNOSIS — Z79899 Other long term (current) drug therapy: Secondary | ICD-10-CM | POA: Insufficient documentation

## 2014-06-07 DIAGNOSIS — R109 Unspecified abdominal pain: Secondary | ICD-10-CM

## 2014-06-07 DIAGNOSIS — A499 Bacterial infection, unspecified: Secondary | ICD-10-CM | POA: Insufficient documentation

## 2014-06-07 DIAGNOSIS — F17200 Nicotine dependence, unspecified, uncomplicated: Secondary | ICD-10-CM | POA: Insufficient documentation

## 2014-06-07 LAB — URINALYSIS, ROUTINE W REFLEX MICROSCOPIC
BILIRUBIN URINE: NEGATIVE
Glucose, UA: NEGATIVE mg/dL
Leukocytes, UA: NEGATIVE
Nitrite: NEGATIVE
PROTEIN: NEGATIVE mg/dL
Specific Gravity, Urine: >1.03 — ABNORMAL HIGH (ref 1.005–1.030)
UROBILINOGEN UA: 0.2 mg/dL (ref 0.0–1.0)
pH: 6 (ref 5.0–8.0)

## 2014-06-07 LAB — CBC WITH DIFFERENTIAL/PLATELET
BASOS PCT: 0 % (ref 0–1)
Basophils Absolute: 0 10*3/uL (ref 0.0–0.1)
EOS PCT: 5 % (ref 0–5)
Eosinophils Absolute: 0.4 10*3/uL (ref 0.0–0.7)
HCT: 33.2 % — ABNORMAL LOW (ref 36.0–46.0)
Hemoglobin: 11.2 g/dL — ABNORMAL LOW (ref 12.0–15.0)
LYMPHS ABS: 3.7 10*3/uL (ref 0.7–4.0)
Lymphocytes Relative: 40 % (ref 12–46)
MCH: 28.9 pg (ref 26.0–34.0)
MCHC: 33.7 g/dL (ref 30.0–36.0)
MCV: 85.8 fL (ref 78.0–100.0)
Monocytes Absolute: 0.4 10*3/uL (ref 0.1–1.0)
Monocytes Relative: 4 % (ref 3–12)
Neutro Abs: 4.7 10*3/uL (ref 1.7–7.7)
Neutrophils Relative %: 51 % (ref 43–77)
PLATELETS: 211 10*3/uL (ref 150–400)
RBC: 3.87 MIL/uL (ref 3.87–5.11)
RDW: 13.6 % (ref 11.5–15.5)
WBC: 9.2 10*3/uL (ref 4.0–10.5)

## 2014-06-07 LAB — BASIC METABOLIC PANEL
BUN: 14 mg/dL (ref 6–23)
CO2: 25 mEq/L (ref 19–32)
Calcium: 9.4 mg/dL (ref 8.4–10.5)
Chloride: 104 mEq/L (ref 96–112)
Creatinine, Ser: 0.76 mg/dL (ref 0.50–1.10)
GFR calc Af Amer: >90 mL/min (ref >90)
GLUCOSE: 98 mg/dL (ref 70–99)
Potassium: 3.7 mEq/L (ref 3.7–5.3)
Sodium: 140 mEq/L (ref 137–147)

## 2014-06-07 LAB — WET PREP, GENITAL
Trich, Wet Prep: NONE SEEN
YEAST WET PREP: NONE SEEN

## 2014-06-07 LAB — URINE MICROSCOPIC-ADD ON

## 2014-06-07 LAB — POC URINE PREG, ED: Preg Test, Ur: NEGATIVE

## 2014-06-07 MED ORDER — HYDROCODONE-ACETAMINOPHEN 5-325 MG PO TABS: 2.0000 | ORAL_TABLET | ORAL | Status: DC | PRN

## 2014-06-07 MED ORDER — IOHEXOL 300 MG/ML  SOLN
100.0000 mL | Freq: Once | INTRAMUSCULAR | Status: AC | PRN
  Administered 2014-06-07: 100 mL via INTRAVENOUS

## 2014-06-07 MED ORDER — METRONIDAZOLE 500 MG PO TABS: 500.0000 mg | ORAL_TABLET | Freq: Two times a day (BID) | ORAL | Status: DC

## 2014-06-07 MED ORDER — IOHEXOL 300 MG/ML  SOLN
50.0000 mL | Freq: Once | INTRAMUSCULAR | Status: AC | PRN
  Administered 2014-06-07: 50 mL via ORAL

## 2014-06-07 NOTE — ED Notes (Signed)
Pt has c/o of lower abdominal pain and headache.

## 2014-06-07 NOTE — ED Provider Notes (Addendum)
CSN: [id number]     Arrival date & time 06/07/14  1939 History   First MD Initiated Contact with Patient 06/07/14 1949     Chief Complaint  Patient presents with  . Abdominal Pain     (Consider location/radiation/quality/duration/timing/severity/associated sxs/prior Treatment) HPI Comments: Patient presents to the ER for evaluation of abdominal pain. Patient has been having pain in the right lower and lower abdomen since this morning. She reports that the pain comes and goes. It usually lasts for about 15 minutes and then goes away. Pain is sharp when present. She has never had similar pain. She has not had a fever. She denies nausea or vomiting but she has had diarrhea.  Patient is a 28 y.o. female presenting with abdominal pain.  Abdominal Pain   Past Medical History  Diagnosis Date  . Hypertension   . Preterm labor   . STD (female)     chlamydia, trichomonas, gonorrhea, HPV, HSV  . Pregnant   . Hx of trichomoniasis     recurrent, 4 times  . Hx of chlamydia infection     multiple times  . Hx of gonorrhea   . HSV-2 (herpes simplex virus 2) infection   . Depression    History reviewed. No pertinent past surgical history. Family History  Problem Relation Age of Onset  . Mental illness Other   . Hypertension Other    History  Substance Use Topics  . Smoking status: Current Every Day Smoker -- 0.25 packs/day    Types: Cigarettes  . Smokeless tobacco: Never Used  . Alcohol Use: Yes     Comment: occasional   OB History   Grav Para Term Preterm Abortions TAB SAB Ect Mult Living   5 4 2 2 1  0 1 0 0 4     Review of Systems  Gastrointestinal: Positive for abdominal pain.  All other systems reviewed and are negative.     Allergies  Motrin  Home Medications   Prior to Admission medications   Medication Sig Start Date End Date Taking? Authorizing Cyniah Gossard  enalapril (VASOTEC) 20 MG tablet Take 1 tablet (20 mg total) by mouth daily. 03/18/14  Yes Fransisca Kaufmann, NP   cloNIDine (CATAPRES) 0.1 MG tablet Take 1 tablet (0.1 mg total) by mouth 3 (three) times daily. 03/18/14   Fransisca Kaufmann, NP  FLUoxetine (PROZAC) 20 MG capsule Take 1 capsule (20 mg total) by mouth daily. 03/18/14   Fransisca Kaufmann, NP  haloperidol (HALDOL) 2 MG tablet Take 1 tablet (2 mg total) by mouth at bedtime. 03/18/14   Fransisca Kaufmann, NP  medroxyPROGESTERone (DEPO-PROVERA) 150 MG/ML injection Inject 1 mL (150 mg total) into the muscle every 3 (three) months. 03/18/14   Fransisca Kaufmann, NP  traZODone (DESYREL) 50 MG tablet Take 1 tablet (50 mg total) by mouth at bedtime. 03/18/14   Fransisca Kaufmann, NP   BP 133/79  Pulse 80  Temp(Src) 98.5 F (36.9 C) (Oral)  Resp 24  Ht 5\' 2"  (1.575 m)  Wt 169 lb (76.658 kg)  BMI 30.90 kg/m2  SpO2 99%  Breastfeeding? No Physical Exam  Constitutional: She is oriented to person, place, and time. She appears well-developed and well-nourished. No distress.  HENT:  Head: Normocephalic and atraumatic.  Right Ear: Hearing normal.  Left Ear: Hearing normal.  Nose: Nose normal.  Mouth/Throat: Oropharynx is clear and moist and mucous membranes are normal.  Eyes: Conjunctivae and EOM are normal. Pupils are equal, round, and reactive to light.  Neck: Normal range  of motion. Neck supple.  Cardiovascular: Regular rhythm, S1 normal and S2 normal.  Exam reveals no gallop and no friction rub.   No murmur heard. Pulmonary/Chest: Effort normal and breath sounds normal. No respiratory distress. She exhibits no tenderness.  Abdominal: Soft. Normal appearance and bowel sounds are normal. There is no hepatosplenomegaly. There is tenderness in the right lower quadrant and suprapubic area. There is no rebound, no guarding, no tenderness at McBurney's point and negative Murphy's sign. No hernia. Hernia confirmed negative in the right inguinal area and confirmed negative in the left inguinal area.  Genitourinary: Vagina normal. There is no rash or tenderness on the right labia. There is no rash or  tenderness on the left labia. Cervix exhibits no motion tenderness. Right adnexum displays tenderness and fullness. Right adnexum displays no mass. Left adnexum displays no mass, no tenderness and no fullness.  Musculoskeletal: Normal range of motion.  Lymphadenopathy:       Right: No inguinal adenopathy present.       Left: No inguinal adenopathy present.  Neurological: She is alert and oriented to person, place, and time. She has normal strength. No cranial nerve deficit or sensory deficit. Coordination normal. GCS eye subscore is 4. GCS verbal subscore is 5. GCS motor subscore is 6.  Skin: Skin is warm, dry and intact. No rash noted. No cyanosis.  Psychiatric: She has a normal mood and affect. Her speech is normal and behavior is normal. Thought content normal.    ED Course  Procedures (including critical care time) Labs Review Labs Reviewed  WET PREP, GENITAL - Abnormal; Notable for the following:    Clue Cells Wet Prep HPF POC MODERATE (*)    WBC, Wet Prep HPF POC FEW (*)    All other components within normal limits  CBC WITH DIFFERENTIAL - Abnormal; Notable for the following:    Hemoglobin 11.2 (*)    HCT 33.2 (*)    All other components within normal limits  URINALYSIS, ROUTINE W REFLEX MICROSCOPIC - Abnormal; Notable for the following:    Specific Gravity, Urine >1.030 (*)    Hgb urine dipstick MODERATE (*)    Ketones, ur TRACE (*)    All other components within normal limits  URINE MICROSCOPIC-ADD ON - Abnormal; Notable for the following:    Squamous Epithelial / LPF FEW (*)    All other components within normal limits  GC/CHLAMYDIA PROBE AMP  BASIC METABOLIC PANEL  RPR  HIV ANTIBODY (ROUTINE TESTING)  POC URINE PREG, ED    Imaging Review Ct Abdomen Pelvis W Contrast  06/07/2014   CLINICAL DATA:  28 year old female with abdominal and pelvic pain.  EXAM: CT ABDOMEN AND PELVIS WITH CONTRAST  TECHNIQUE: Multidetector CT imaging of the abdomen and pelvis was performed  using the standard protocol following bolus administration of intravenous contrast.  CONTRAST:  100mL OMNIPAQUE IOHEXOL 300 MG/ML  SOLN  COMPARISON:  None.  FINDINGS: Upper limits normal heart size noted.  The liver, spleen, pancreas, adrenal glands kidneys are unremarkable.  Probable gallstones noted without CT evidence of acute cholecystitis.  A small amount of free pelvic fluid is present.  There is no evidence of biliary dilatation, abdominal aortic aneurysm or enlarged lymph nodes.  The bowel and bladder are unremarkable. There is no evidence of bowel obstruction, pneumoperitoneum or abscess.  The uterus and adnexal regions are within normal limits.  No acute or suspicious bony abnormalities are present.  IMPRESSION: Small amount of nonspecific free pelvic fluid which may  be physiologic. No other acute abnormalities noted.  Probable cholelithiasis.  No CT evidence of acute cholecystitis.   Electronically Signed   By: Laveda AbbeJeff  Hu M.D.   On: 06/07/2014 21:42     EKG Interpretation None      MDM   Final diagnoses:  None  Abdominal/pelvic pain  Patient was asked to ER for evaluation of abdominal pain. Patient has been having nonspecific lower abdominal pain has been intermittent through the day. Patient is predominantly on the right side. Pelvic exam revealed some fullness and tenderness in the area of the adnexa, but no swelling or mass. There is no cervical motion tenderness. Lab work was unremarkable. CT scan was performed to further evaluate. No evidence of appendicitis. There may be gallstones, but the pain in his right lower quadrant not right upper, these are nonspecific an incidental. Can followup with her primary doctor for this. Patient reassured, will be treated with analgesia.    Gilda Creasehristopher J. Pollina, MD 06/07/14 2147  Gilda Creasehristopher J. Pollina, MD 06/07/14 2156

## 2014-06-07 NOTE — Discharge Instructions (Signed)
Abdominal Pain Many things can cause abdominal pain. Usually, abdominal pain is not caused by a disease and will improve without treatment. It can often be observed and treated at home. Your health care provider will do a physical exam and possibly order blood tests and X-rays to help determine the seriousness of your pain. However, in many cases, more time must pass before a clear cause of the pain can be found. Before that point, your health care provider may not know if you need more testing or further treatment. HOME CARE INSTRUCTIONS  Monitor your abdominal pain for any changes. The following actions may help to alleviate any discomfort you are experiencing:  Only take over-the-counter or prescription medicines as directed by your health care provider.  Do not take laxatives unless directed to do so by your health care provider.  Try a clear liquid diet (broth, tea, or water) as directed by your health care provider. Slowly move to a bland diet as tolerated. SEEK MEDICAL CARE IF:  You have unexplained abdominal pain.  You have abdominal pain associated with nausea or diarrhea.  You have pain when you urinate or have a bowel movement.  You experience abdominal pain that wakes you in the night.  You have abdominal pain that is worsened or improved by eating food.  You have abdominal pain that is worsened with eating fatty foods.  You have a fever. SEEK IMMEDIATE MEDICAL CARE IF:   Your pain does not go away within 2 hours.  You keep throwing up (vomiting).  Your pain is felt only in portions of the abdomen, such as the right side or the left lower portion of the abdomen.  You pass bloody or black tarry stools. MAKE SURE YOU:  Understand these instructions.   Will watch your condition.   Will get help right away if you are not doing well or get worse.  Document Released: 09/13/2005 Document Revised: 12/09/2013 Document Reviewed: 08/13/2013 [facility] Patient Information  2015 [place], [place]. This information is not intended to replace advice given to you by your health care provider. Make sure you discuss any questions you have with your health care provider.  Pelvic Pain Pelvic pain is pain felt below the belly button and between your hips. It can be caused by many different things. It is important to get help right away. This is especially true for severe, sharp, or unusual pain that comes on suddenly.  HOME CARE  Only take medicine as told by your doctor.  Rest as told by your doctor.  Eat a healthy diet, such as fruits, vegetables, and lean meats.  Drink enough fluids to keep your pee (urine) clear or pale yellow, or as told.  Avoid sex (intercourse) if it causes pain.  Apply warm or cold packs to your lower belly (abdomen). Use the type of pack that helps the pain.  Avoid situations that cause you stress.  Keep a journal to track your pain. Write down:  When the pain started.  Where it is located.  If there are things that seem to be related to the pain, such as food or your period.  Follow up with your doctor as told. GET HELP RIGHT AWAY IF:   You have heavy bleeding from the vagina.  You have more pelvic pain.  You feel lightheaded or pass out (faint).  You have chills.  You have pain when you pee or have blood in your pee.  You cannot stop having watery poop (diarrhea).  You  cannot stop throwing up (vomiting).  You have a fever or lasting symptoms for more than 3 days.  You have a fever and your symptoms suddenly get worse.  You are being physically or sexually abused.  Your medicine does not help your pain.  You have fluid (discharge) coming from your vagina that is not normal. MAKE SURE YOU:  Understand these instructions.  Will watch your condition.  Will get help if you are not doing well or get worse. Document Released: 05/22/2008 Document Revised: 06/04/2012 Document Reviewed: 03/25/2012 [facility] Patient  Information 2015 [place], [place]. This information is not intended to replace advice given to you by your health care provider. Make sure you discuss any questions you have with your health care provider.  Bacterial Vaginosis Bacterial vaginosis is a vaginal infection that occurs when the normal balance of bacteria in the vagina is disrupted. It results from an overgrowth of certain bacteria. This is the most common vaginal infection in women of childbearing age. Treatment is important to prevent complications, especially in pregnant women, as it can cause a premature delivery. CAUSES  Bacterial vaginosis is caused by an increase in harmful bacteria that are normally present in smaller amounts in the vagina. Several different kinds of bacteria can cause bacterial vaginosis. However, the reason that the condition develops is not fully understood. RISK FACTORS Certain activities or behaviors can put you at an increased risk of developing bacterial vaginosis, including:  Having a new sex partner or multiple sex partners.  Douching.  Using an intrauterine device (IUD) for contraception. Women do not get bacterial vaginosis from toilet seats, bedding, swimming pools, or contact with objects around them. SIGNS AND SYMPTOMS  Some women with bacterial vaginosis have no signs or symptoms. Common symptoms include:  Grey vaginal discharge.  A fishlike odor with discharge, especially after sexual intercourse.  Itching or burning of the vagina and vulva.  Burning or pain with urination. DIAGNOSIS  Your health care provider will take a medical history and examine the vagina for signs of bacterial vaginosis. A sample of vaginal fluid may be taken. Your health care provider will look at this sample under a microscope to check for bacteria and abnormal cells. A vaginal pH test may also be done.  TREATMENT  Bacterial vaginosis may be treated with antibiotic medicines. These may be given in the form of a  pill or a vaginal cream. A second round of antibiotics may be prescribed if the condition comes back after treatment.  HOME CARE INSTRUCTIONS   Only take over-the-counter or prescription medicines as directed by your health care provider.  If antibiotic medicine was prescribed, take it as directed. Make sure you finish it even if you start to feel better.  Do not have sex until treatment is completed.  Tell all sexual partners that you have a vaginal infection. They should see their health care provider and be treated if they have problems, such as a mild rash or itching.  Practice safe sex by using condoms and only having one sex partner. SEEK MEDICAL CARE IF:   Your symptoms are not improving after 3 days of treatment.  You have increased discharge or pain.  You have a fever. MAKE SURE YOU:   Understand these instructions.  Will watch your condition.  Will get help right away if you are not doing well or get worse. FOR MORE INFORMATION  Centers for Disease Control and Prevention, Division of STD Prevention: SolutionApps.co.zawww.cdc.gov/std American Sexual Health Association (ASHA): www.ashastd.org  Document Released: 12/04/2005 Document Revised: 09/24/2013 Document Reviewed: 07/16/2013 [facility] Patient Information 2015 [place], [place]. This information is not intended to replace advice given to you by your health care provider. Make sure you discuss any questions you have with your health care provider.  Cholelithiasis Cholelithiasis (also called gallstones) is a form of gallbladder disease. The gallbladder is a small organ that helps you digest fats. Symptoms of gallstones are:  Feeling sick to your stomach (nausea).  Throwing up (vomiting).  Belly pain.  Yellowing of the skin (jaundice).  Sudden pain. You may feel the pain for minutes to hours.  Fever.  Pain to the touch. HOME CARE  Only take medicines as told by your doctor.  Eat a low-fat diet until you see your doctor again.  Eating fat can result in pain.  Follow up with your doctor as told. Attacks usually happen time after time. Surgery is usually needed for permanent treatment. GET HELP RIGHT AWAY IF:   Your pain gets worse.  Your pain is not helped by medicines.  You have a fever and lasting symptoms for more than 2-3 days.  You have a fever and your symptoms suddenly get worse.  You keep feeling sick to your stomach and throwing up. MAKE SURE YOU:   Understand these instructions.  Will watch your condition.  Will get help right away if you are not doing well or get worse. Document Released: 05/22/2008 Document Revised: 08/06/2013 Document Reviewed: 05/28/2013 [facility] Patient Information 2015 [place], [place]. This information is not intended to replace advice given to you by your health care provider. Make sure you discuss any questions you have with your health care provider.

## 2014-06-08 LAB — RPR

## 2014-06-08 LAB — HIV ANTIBODY (ROUTINE TESTING W REFLEX): HIV: NONREACTIVE

## 2014-06-09 LAB — GC/CHLAMYDIA PROBE AMP
CT Probe RNA: NEGATIVE
GC Probe RNA: NEGATIVE

## 2014-10-19 ENCOUNTER — Encounter (HOSPITAL_COMMUNITY): Payer: Self-pay | Admitting: Emergency Medicine

## 2015-02-12 ENCOUNTER — Other Ambulatory Visit: Payer: Self-pay | Admitting: Women's Health

## 2015-09-28 ENCOUNTER — Ambulatory Visit (INDEPENDENT_AMBULATORY_CARE_PROVIDER_SITE_OTHER): Payer: Medicaid Other | Admitting: Adult Health

## 2015-09-28 ENCOUNTER — Encounter: Payer: Self-pay | Admitting: Adult Health

## 2015-09-28 VITALS — BP 118/80 | HR 84 | Ht 62.5 in | Wt 186.0 lb

## 2015-09-28 DIAGNOSIS — N926 Irregular menstruation, unspecified: Secondary | ICD-10-CM | POA: Diagnosis not present

## 2015-09-28 DIAGNOSIS — R350 Frequency of micturition: Secondary | ICD-10-CM

## 2015-09-28 DIAGNOSIS — Z3202 Encounter for pregnancy test, result negative: Secondary | ICD-10-CM

## 2015-09-28 HISTORY — DX: Irregular menstruation, unspecified: N92.6

## 2015-09-28 HISTORY — DX: Frequency of micturition: R35.0

## 2015-09-28 LAB — POCT URINALYSIS DIPSTICK
GLUCOSE UA: NEGATIVE
LEUKOCYTES UA: NEGATIVE
NITRITE UA: NEGATIVE
PROTEIN UA: NEGATIVE
RBC UA: NEGATIVE

## 2015-09-28 LAB — POCT URINE PREGNANCY: Preg Test, Ur: NEGATIVE

## 2015-09-28 NOTE — Progress Notes (Signed)
Subjective:     Patient ID: Stephanie Cook, female   DOB: 21-Feb-1986, 29 y.o.   MRN: [id number]  HPI Stephanie Cook is a 29 year old black female in for UPT has missed a period and has frequent urination.She has also noticed white  milky discharge right breast at times.She been touching breast today but no discharge.  Review of Systems Patient denies any headaches, hearing loss, fatigue, blurred vision, shortness of breath, chest pain, abdominal pain, problems with bowel movements,  or intercourse. No joint pain or mood swings. See HPI for positives. Reviewed past medical,surgical, social and family history. Reviewed medications and allergies.     Objective:   Physical Exam BP 118/80 mmHg  Pulse 84  Ht 5' 2.5" (1.588 m)  Wt 186 lb (84.369 kg)  BMI 33.46 kg/m2  LMP 08/24/2015 (Approximate)  Breastfeeding? No UPT negative, urine dipstick negative, Skin warm and dry.  Lungs: clear to ausculation bilaterally. Cardiovascular: regular rate and rhythm.   Leave breasts alone,she says she is Ok if is pregnant.  Assessment:     Missed period Frequent urination    Plan:     Check QHCG and TSH Return in 2 weeks for pap and physical, will get prolactin then

## 2015-09-28 NOTE — Patient Instructions (Signed)
Return in 2 weeks for pap and physical  

## 2015-09-29 ENCOUNTER — Telehealth: Payer: Self-pay | Admitting: Adult Health

## 2015-09-29 LAB — BETA HCG QUANT (REF LAB): hCG Quant: <1 m[IU]/mL

## 2015-09-29 LAB — TSH: TSH: 2.52 u[IU]/mL (ref 0.450–4.500)

## 2015-09-29 NOTE — Telephone Encounter (Signed)
Spoke with pt letting her know her quant and TSH was normal. Pt voiced understanding. JSY

## 2015-10-13 ENCOUNTER — Other Ambulatory Visit: Payer: Medicaid Other | Admitting: Adult Health

## 2015-10-20 ENCOUNTER — Other Ambulatory Visit: Payer: Medicaid Other | Admitting: Adult Health

## 2015-11-04 ENCOUNTER — Other Ambulatory Visit: Payer: Medicaid Other | Admitting: Adult Health

## 2015-11-19 ENCOUNTER — Other Ambulatory Visit: Payer: Medicaid Other | Admitting: Adult Health

## 2015-11-19 ENCOUNTER — Encounter: Payer: Self-pay | Admitting: Adult Health

## 2015-12-24 ENCOUNTER — Other Ambulatory Visit: Payer: Medicaid Other | Admitting: Adult Health

## 2016-02-18 ENCOUNTER — Encounter (HOSPITAL_COMMUNITY): Payer: Self-pay | Admitting: Emergency Medicine

## 2016-02-18 ENCOUNTER — Emergency Department (HOSPITAL_COMMUNITY): Payer: Medicaid Other

## 2016-02-18 ENCOUNTER — Emergency Department (HOSPITAL_COMMUNITY)
Admission: EM | Admit: 2016-02-18 | Discharge: 2016-02-18 | Disposition: A | Discharge status: Home / Self Care | Payer: Medicaid Other | Attending: Emergency Medicine | Admitting: Emergency Medicine

## 2016-02-18 DIAGNOSIS — Z79899 Other long term (current) drug therapy: Secondary | ICD-10-CM | POA: Insufficient documentation

## 2016-02-18 DIAGNOSIS — Z8742 Personal history of other diseases of the female genital tract: Secondary | ICD-10-CM | POA: Insufficient documentation

## 2016-02-18 DIAGNOSIS — F17210 Nicotine dependence, cigarettes, uncomplicated: Secondary | ICD-10-CM | POA: Insufficient documentation

## 2016-02-18 DIAGNOSIS — B349 Viral infection, unspecified: Secondary | ICD-10-CM | POA: Insufficient documentation

## 2016-02-18 DIAGNOSIS — Z8619 Personal history of other infectious and parasitic diseases: Secondary | ICD-10-CM | POA: Insufficient documentation

## 2016-02-18 DIAGNOSIS — I10 Essential (primary) hypertension: Secondary | ICD-10-CM | POA: Insufficient documentation

## 2016-02-18 MED ORDER — BENZONATATE 100 MG PO CAPS: 100.0000 mg | ORAL_CAPSULE | Freq: Three times a day (TID) | ORAL | Status: DC

## 2016-02-18 NOTE — ED Provider Notes (Signed)
CSN: [id number]     Arrival date & time 02/18/16  1358 History   First MD Initiated Contact with Patient 02/18/16 1424     Chief Complaint  Patient presents with  . Sore Throat     (Consider location/radiation/quality/duration/timing/severity/associated sxs/prior Treatment) Patient is a 30 y.o. female presenting with pharyngitis. The history is provided by the patient. No language interpreter was used.  Sore Throat This is a new problem. The current episode started today. The problem occurs constantly. The problem has been unchanged. Pertinent negatives include no vomiting. Nothing aggravates the symptoms. The treatment provided moderate relief.  Pt complains of a cough.  Pt reports cough causes her throat to hurt.  Past Medical History  Diagnosis Date  . Hypertension   . Preterm labor   . STD (female)     chlamydia, trichomonas, gonorrhea, HPV, HSV  . Pregnant   . Hx of trichomoniasis     recurrent, 4 times  . Hx of chlamydia infection     multiple times  . Hx of gonorrhea   . HSV-2 (herpes simplex virus 2) infection   . Depression   . Missed period 09/28/2015  . Frequent urination 09/28/2015   History reviewed. No pertinent past surgical history. Family History  Problem Relation Age of Onset  . Hypertension Maternal Grandmother    Social History  Substance Use Topics  . Smoking status: Current Every Day Smoker -- 0.50 packs/day for 8 years    Types: Cigarettes  . Smokeless tobacco: Never Used  . Alcohol Use: Yes     Comment: every 2 weeks   OB History    Gravida Para Term Preterm AB TAB SAB Ectopic Multiple Living   5 4 2 2 1  0 1 0 0 4     Review of Systems  Gastrointestinal: Negative for vomiting.  All other systems reviewed and are negative.     Allergies  Motrin  Home Medications   Prior to Admission medications   Medication Sig Start Date End Date Taking? Authorizing Provider  amLODipine (NORVASC) 10 MG tablet TAKE ONE TABLET BY MOUTH DAILY.  02/13/15  Yes Cheral MarkerKimberly R Booker, CNM  hydrochlorothiazide (MICROZIDE) 12.5 MG capsule TAKE ONE CAPSULE BY MOUTH DAILY. 02/13/15  Yes Cheral MarkerKimberly R Booker, CNM  Pseudoephedrine-APAP-DM [id number] MG/15ML LIQD Take 5-10 mLs by mouth daily as needed (for cough).   Yes Historical Provider, MD  benzonatate (TESSALON) 100 MG capsule Take 1 capsule (100 mg total) by mouth every 8 (eight) hours. 02/18/16   Elson AreasLeslie K Kamareon Sciandra, PA-C   BP 115/87 mmHg  Pulse 92  Temp(Src) 98.8 F (37.1 C) (Oral)  Resp 20  Ht 5\' 3"  (1.6 m)  SpO2 99% Physical Exam  Constitutional: She is oriented to person, place, and time. She appears well-developed and well-nourished.  HENT:  Head: Normocephalic.  Eyes: EOM are normal.  Neck: Normal range of motion.  Pulmonary/Chest: Effort normal.  Abdominal: She exhibits no distension.  Musculoskeletal: Normal range of motion.  Neurological: She is alert and oriented to person, place, and time.  Psychiatric: She has a normal mood and affect.  Nursing note and vitals reviewed.   ED Course  Procedures (including critical care time) Labs Review Labs Reviewed - No data to display  Imaging Review Dg Chest 2 View  02/18/2016  CLINICAL DATA:  Cough and chest pain for 2 days EXAM: CHEST  2 VIEW COMPARISON:  None. FINDINGS: Lungs are clear. Heart size and pulmonary vascularity are normal. No adenopathy. No pneumothorax. No  bone lesions. IMPRESSION: No abnormality noted. Electronically Signed   By: Bretta Bang III M.D.   On: 02/18/2016 14:33   I have personally reviewed and evaluated these images and lab results as part of my medical decision-making.   EKG Interpretation None      MDM   Final diagnoses:  Viral illness    An After Visit Summary was printed and given to the patient. Meds ordered this encounter  Medications  . Pseudoephedrine-APAP-DM 30-325-15 MG/15ML LIQD    Sig: Take 5-10 mLs by mouth daily as needed (for cough).  . benzonatate (TESSALON) 100 MG capsule     Sig: Take 1 capsule (100 mg total) by mouth every 8 (eight) hours.    Dispense:  21 capsule    Refill:  0    Order Specific Question:  Supervising Provider    Answer:  Eber Hong [[id number]]     Lonia Skinner [place], PA-C 02/18/16 [address] [place], [place] 02/18/16 1528  Bethann Berkshire, MD 02/19/16 [phone]

## 2016-02-18 NOTE — Discharge Instructions (Signed)
Viral Infections °A viral infection can be caused by different types of viruses. Most viral infections are not serious and resolve on their own. However, some infections may cause severe symptoms and may lead to further complications. °SYMPTOMS °Viruses can frequently cause: °· Minor sore throat. °· Aches and pains. °· Headaches. °· Runny nose. °· Different types of rashes. °· Watery eyes. °· Tiredness. °· Cough. °· Loss of appetite. °· Gastrointestinal infections, resulting in nausea, vomiting, and diarrhea. °These symptoms do not respond to antibiotics because the infection is not caused by bacteria. However, you might catch a bacterial infection following the viral infection. This is sometimes called a "superinfection." Symptoms of such a bacterial infection may include: °· Worsening sore throat with pus and difficulty swallowing. °· Swollen neck glands. °· Chills and a high or persistent fever. °· Severe headache. °· Tenderness over the sinuses. °· Persistent overall ill feeling (malaise), muscle aches, and tiredness (fatigue). °· Persistent cough. °· Yellow, green, or brown mucus production with coughing. °HOME CARE INSTRUCTIONS  °· Only take over-the-counter or prescription medicines for pain, discomfort, diarrhea, or fever as directed by your caregiver. °· Drink enough water and fluids to keep your urine clear or pale yellow. Sports drinks can provide valuable electrolytes, sugars, and hydration. °· Get plenty of rest and maintain proper nutrition. Soups and broths with crackers or rice are fine. °SEEK IMMEDIATE MEDICAL CARE IF:  °· You have severe headaches, shortness of breath, chest pain, neck pain, or an unusual rash. °· You have uncontrolled vomiting, diarrhea, or you are unable to keep down fluids. °· You or your child has an oral temperature above 102° F (38.9° C), not controlled by medicine. °· Your baby is older than 3 months with a rectal temperature of 102° F (38.9° C) or higher. °· Your baby is 3  months old or younger with a rectal temperature of 100.4° F (38° C) or higher. °MAKE SURE YOU:  °· Understand these instructions. °· Will watch your condition. °· Will get help right away if you are not doing well or get worse. °  °This information is not intended to replace advice given to you by your health care provider. Make sure you discuss any questions you have with your health care provider. °  °Document Released: 09/13/2005 Document Revised: 02/26/2012 Document Reviewed: 05/12/2015 °Elsevier Interactive Patient Education ©2016 [employer] ° °

## 2016-02-18 NOTE — ED Notes (Signed)
Pt reports sore throat, cough, and chills x 2 days. Denies n/v/d.

## 2016-03-09 ENCOUNTER — Other Ambulatory Visit: Payer: Self-pay | Admitting: *Deleted

## 2016-04-10 ENCOUNTER — Telehealth: Payer: Self-pay | Admitting: *Deleted

## 2016-04-18 ENCOUNTER — Ambulatory Visit: Payer: Medicaid Other | Admitting: Adult Health

## 2016-04-20 ENCOUNTER — Other Ambulatory Visit (HOSPITAL_COMMUNITY)
Admission: RE | Admit: 2016-04-20 | Discharge: 2016-04-20 | Disposition: A | Discharge status: Home / Self Care | Payer: Medicaid Other | Source: Ambulatory Visit | Attending: Obstetrics and Gynecology | Admitting: Obstetrics and Gynecology

## 2016-04-20 ENCOUNTER — Ambulatory Visit (INDEPENDENT_AMBULATORY_CARE_PROVIDER_SITE_OTHER): Payer: Medicaid Other | Admitting: Obstetrics and Gynecology

## 2016-04-20 ENCOUNTER — Encounter: Payer: Self-pay | Admitting: Obstetrics and Gynecology

## 2016-04-20 VITALS — BP 120/82 | Ht 63.0 in | Wt 188.0 lb

## 2016-04-20 DIAGNOSIS — Z3049 Encounter for surveillance of other contraceptives: Secondary | ICD-10-CM

## 2016-04-20 DIAGNOSIS — Z113 Encounter for screening for infections with a predominantly sexual mode of transmission: Secondary | ICD-10-CM | POA: Insufficient documentation

## 2016-04-20 DIAGNOSIS — Z01419 Encounter for gynecological examination (general) (routine) without abnormal findings: Secondary | ICD-10-CM

## 2016-04-20 DIAGNOSIS — Z3009 Encounter for other general counseling and advice on contraception: Secondary | ICD-10-CM

## 2016-04-20 DIAGNOSIS — Z3200 Encounter for pregnancy test, result unknown: Secondary | ICD-10-CM

## 2016-04-20 NOTE — Progress Notes (Signed)
  Assessment:  Annual Gyn Exam Desires long-term contraception with Depo-Provera    Plan:  1. Follow up 8 days for quantitative hcg  2. pap smear done, next pap due 1 year 3. return annually or prnAlso return in 8 days for contraception management. Patient given paperwork for serum hCG to be drawn today  Subjective:  Stephanie McgregorBrittany D Lamprecht is a 30 y.o. female [phone] who presents for annual exam. No LMP recorded. Patient is not currently having periods (Reason: Irregular Periods). The patient has no complaints today.She is sexually active without contraception. Last sexual activity 3 days ago she desires long-term contraception but "doesn't do abstinence" she is here with her partner who is counseled to use condoms reliably for 8 days  The following portions of the patient's history were reviewed and updated as appropriate: allergies, current medications, past family history, past medical history, past social history, past surgical history and problem list. Past Medical History  Diagnosis Date  . Hypertension   . Preterm labor   . STD (female)     chlamydia, trichomonas, gonorrhea, HPV, HSV  . Pregnant   . Hx of trichomoniasis     recurrent, 4 times  . Hx of chlamydia infection     multiple times  . Hx of gonorrhea   . HSV-2 (herpes simplex virus 2) infection   . Depression   . Missed period 09/28/2015  . Frequent urination 09/28/2015    History reviewed. No pertinent past surgical history.   Current outpatient prescriptions:  .  amLODipine (NORVASC) 10 MG tablet, TAKE ONE TABLET BY MOUTH DAILY., Disp: 30 tablet, Rfl: 9 .  hydrochlorothiazide (MICROZIDE) 12.5 MG capsule, TAKE ONE CAPSULE BY MOUTH DAILY., Disp: 30 capsule, Rfl: 9  Review of Systems Constitutional: negative Gastrointestinal: negative Genitourinary: negative   Objective:  BP 120/82 mmHg  Ht 5\' 3"  (1.6 m)  Wt 188 lb (85.276 kg)  BMI 33.31 kg/m2   BMI: Body mass index is 33.31 kg/(m^2).  General Appearance:  Alert, appropriate appearance for age. No acute distress HEENT: Grossly normal Neck / Thyroid:  Cardiovascular: RRR; normal S1, S2, no murmur Lungs: CTA bilaterally Back: No CVAT Breast Exam: Normal to inspection, Normal breast tissue bilaterally and No masses or nodes.No dimpling, nipple retraction or discharge. Gastrointestinal: Soft, non-tender, no masses or organomegaly Pelvic Exam: External genitalia: normal general appearance Vaginal: normal mucosa without prolapse or lesions and normal without tenderness, induration or masses Cervix: normal appearance Adnexa: normal bimanual exam Uterus: normal single, nontender Rectovaginal: not indicated Lymphatic Exam: Non-palpable nodes in neck, clavicular, axillary, or inguinal regions  Skin: no rash or abnormalities Neurologic: Normal gait and speech, no tremor  Psychiatric: Alert and oriented, appropriate affect.  Urinalysis:normal and Not done  Christin BachJohn Lanijah Warzecha. MD Pgr [phone] 12:07 PM   By signing my name below, I, Sonum Patel, attest that this documentation has been prepared under the direction and in the presence of Adline PotterJennifer A Griffin, NP. Electronically Signed: Sonum Patel, Neurosurgeoncribe. 04/20/2016. 12:18 PM.   I personally performed the services described in this documentation, which was SCRIBED in my presence. The recorded information has been reviewed and considered accurate. It has been edited as necessary during review. Tilda BurrowFERGUSON,Mikale Silversmith V, MD

## 2016-04-20 NOTE — Progress Notes (Signed)
Patient ID: Stephanie McgregorBrittany D Tessler, female   DOB: 22-Mar-1986, 30 y.o.   MRN: [id number] Pt here today for annual exam . Pt states that she would like to discuss getting back on DEPO .

## 2016-04-21 LAB — CYTOLOGY - PAP

## 2016-04-21 LAB — HIV ANTIBODY (ROUTINE TESTING W REFLEX): HIV Screen 4th Generation wRfx: NONREACTIVE

## 2016-04-21 LAB — RPR: RPR: NONREACTIVE

## 2016-04-28 ENCOUNTER — Ambulatory Visit: Payer: Medicaid Other

## 2016-05-02 ENCOUNTER — Other Ambulatory Visit: Payer: Self-pay | Admitting: *Deleted

## 2016-05-02 ENCOUNTER — Ambulatory Visit: Payer: Medicaid Other

## 2016-05-02 ENCOUNTER — Encounter: Payer: Self-pay | Admitting: Obstetrics & Gynecology

## 2016-05-02 ENCOUNTER — Ambulatory Visit (INDEPENDENT_AMBULATORY_CARE_PROVIDER_SITE_OTHER): Payer: Medicaid Other | Admitting: *Deleted

## 2016-05-02 ENCOUNTER — Encounter: Payer: Self-pay | Admitting: *Deleted

## 2016-05-02 ENCOUNTER — Telehealth: Payer: Self-pay | Admitting: Obstetrics and Gynecology

## 2016-05-02 DIAGNOSIS — Z3202 Encounter for pregnancy test, result negative: Secondary | ICD-10-CM

## 2016-05-02 DIAGNOSIS — Z309 Encounter for contraceptive management, unspecified: Secondary | ICD-10-CM | POA: Diagnosis not present

## 2016-05-02 DIAGNOSIS — Z3042 Encounter for surveillance of injectable contraceptive: Secondary | ICD-10-CM

## 2016-05-02 LAB — POCT URINE PREGNANCY: PREG TEST UR: NEGATIVE

## 2016-05-02 MED ORDER — MEDROXYPROGESTERONE ACETATE 150 MG/ML IM SUSP
150.0000 mg | Freq: Once | INTRAMUSCULAR | Status: AC
  Administered 2016-05-02: 150 mg via INTRAMUSCULAR

## 2016-05-02 MED ORDER — MEDROXYPROGESTERONE ACETATE 150 MG/ML IM SUSP: 150.0000 mg | INTRAMUSCULAR | Status: DC

## 2016-05-02 NOTE — Progress Notes (Signed)
Pt here for Depo. Pt tolerated shot well. Return in 12 weeks for next shot. JSY 

## 2016-05-02 NOTE — Telephone Encounter (Signed)
Pt spoke with Dr. Rachel BoFeguson about DEPO injection at last visit and forgot to send in the Rx. Rx was sent to her pharmacy

## 2016-05-02 NOTE — Telephone Encounter (Signed)
Pt called stating that she is at her pharmacy and is trying to get her depo and it is not there. Please contact pt she as an appointment at 2:30

## 2016-06-23 ENCOUNTER — Other Ambulatory Visit: Payer: Self-pay | Admitting: Women's Health

## 2016-06-26 ENCOUNTER — Telehealth: Payer: Self-pay | Admitting: Obstetrics and Gynecology

## 2016-06-26 NOTE — Telephone Encounter (Signed)
Spoke with pt letting her know BP meds were refilled. Advised she needs to start getting these at PCP. Pt voiced understanding. JSY

## 2016-07-25 ENCOUNTER — Ambulatory Visit (INDEPENDENT_AMBULATORY_CARE_PROVIDER_SITE_OTHER): Payer: Medicaid Other | Admitting: *Deleted

## 2016-07-25 ENCOUNTER — Ambulatory Visit: Payer: Medicaid Other

## 2016-07-25 ENCOUNTER — Encounter: Payer: Self-pay | Admitting: *Deleted

## 2016-07-25 DIAGNOSIS — Z3049 Encounter for surveillance of other contraceptives: Secondary | ICD-10-CM

## 2016-07-25 DIAGNOSIS — Z3202 Encounter for pregnancy test, result negative: Secondary | ICD-10-CM

## 2016-07-25 DIAGNOSIS — Z3042 Encounter for surveillance of injectable contraceptive: Secondary | ICD-10-CM

## 2016-07-25 LAB — POCT URINE PREGNANCY: Preg Test, Ur: NEGATIVE

## 2016-07-25 MED ORDER — MEDROXYPROGESTERONE ACETATE 150 MG/ML IM SUSP
150.0000 mg | Freq: Once | INTRAMUSCULAR | Status: AC
  Administered 2016-07-25: 150 mg via INTRAMUSCULAR

## 2016-07-25 NOTE — Progress Notes (Signed)
Pt here for Depo. Pt tolerated shot well. Return in 12 weeks for next shot. JSY 

## 2016-08-01 ENCOUNTER — Encounter: Payer: Self-pay | Admitting: Physician Assistant

## 2016-08-01 ENCOUNTER — Ambulatory Visit: Payer: Self-pay | Admitting: Physician Assistant

## 2016-08-01 VITALS — BP 118/78 | HR 100 | Temp 98.1°F | Ht 63.0 in | Wt 184.0 lb

## 2016-08-01 DIAGNOSIS — I10 Essential (primary) hypertension: Secondary | ICD-10-CM | POA: Insufficient documentation

## 2016-08-01 DIAGNOSIS — Z130 Encounter for screening for diseases of the blood and blood-forming organs and certain disorders involving the immune mechanism: Secondary | ICD-10-CM

## 2016-08-01 DIAGNOSIS — E669 Obesity, unspecified: Secondary | ICD-10-CM | POA: Insufficient documentation

## 2016-08-01 DIAGNOSIS — Z13220 Encounter for screening for lipoid disorders: Secondary | ICD-10-CM

## 2016-08-01 DIAGNOSIS — Z131 Encounter for screening for diabetes mellitus: Secondary | ICD-10-CM

## 2016-08-01 DIAGNOSIS — F17210 Nicotine dependence, cigarettes, uncomplicated: Secondary | ICD-10-CM | POA: Insufficient documentation

## 2016-08-01 MED ORDER — AMLODIPINE BESYLATE 10 MG PO TABS: 10.0000 mg | ORAL_TABLET | Freq: Every day | ORAL | 1 refills | Status: DC

## 2016-08-01 NOTE — Progress Notes (Signed)
BP 118/78   Pulse 100   Temp 98.1 F (36.7 C)   Ht 5\' 3"  (1.6 m)   Wt 184 lb (83.5 kg)   SpO2 98%   BMI 32.59 kg/m    Subjective:    Patient ID: Stephanie Cook Dotter, female    DOB: 1986-10-04, 30 y.o.   MRN: [id number]  HPI: Stephanie Cook [place] is a 30 y.o. female presenting on 08/01/2016 for No chief complaint on file.   HPI   NEW PATIENT APPOINTMENT  Pt says she is only taking one bp pill but she doesn't know whether it's the norvasc or hctz.  She did not bring with her.  Pt has been getting her bp meds from her gyn.  She feels well and denies any problems.   Relevant past medical, surgical, family and social history reviewed and updated as indicated. Interim medical history since our last visit reviewed. Allergies and medications reviewed and updated.  CURRENT MEDS: Depo provera ? norvasc 10mg  ? hctz 12.5mg   Review of Systems  Constitutional: Negative for appetite change, chills, diaphoresis, fatigue, fever and unexpected weight change.  HENT: Negative for congestion, drooling, ear pain, facial swelling, hearing loss, mouth sores, sneezing, sore throat, trouble swallowing and voice change.   Eyes: Negative for pain, discharge, redness, itching and visual disturbance.  Respiratory: Negative for cough, choking, shortness of breath and wheezing.   Cardiovascular: Negative for chest pain, palpitations and leg swelling.  Gastrointestinal: Negative for abdominal pain, blood in stool, constipation, diarrhea and vomiting.  Endocrine: Negative for cold intolerance, heat intolerance and polydipsia.  Genitourinary: Negative for decreased urine volume, dysuria and hematuria.  Musculoskeletal: Negative for arthralgias, back pain and gait problem.  Skin: Negative for rash.  Allergic/Immunologic: Negative for environmental allergies.  Neurological: Negative for seizures, syncope, light-headedness and headaches.  Hematological: Negative for adenopathy.  Psychiatric/Behavioral:  Negative for agitation, dysphoric mood and suicidal ideas. The patient is not nervous/anxious.     Per HPI unless specifically indicated above     Objective:    BP 118/78   Pulse 100   Temp 98.1 F (36.7 C)   Ht 5\' 3"  (1.6 m)   Wt 184 lb (83.5 kg)   SpO2 98%   BMI 32.59 kg/m   Wt Readings from Last 3 Encounters:  08/01/16 184 lb (83.5 kg)  04/20/16 188 lb (85.3 kg)  09/28/15 186 lb (84.4 kg)    Physical Exam  Constitutional: She is oriented to person, place, and time. She appears well-developed and well-nourished.  HENT:  Head: Normocephalic and atraumatic.  Mouth/Throat: Oropharynx is clear and moist. No oropharyngeal exudate.  Eyes: Conjunctivae and EOM are normal. Pupils are equal, round, and reactive to light.  Neck: Neck supple. No thyromegaly present.  Cardiovascular: Normal rate and regular rhythm.   Pulmonary/Chest: Effort normal and breath sounds normal.  Abdominal: Soft. Bowel sounds are normal. She exhibits no mass. There is no hepatosplenomegaly. There is no tenderness.  Musculoskeletal: She exhibits no edema.  Lymphadenopathy:    She has no cervical adenopathy.  Neurological: She is alert and oriented to person, place, and time. Gait normal.  Skin: Skin is warm and dry.  Psychiatric: She has a normal mood and affect. Her behavior is normal.  Vitals reviewed.       Assessment & Plan:   Encounter Diagnoses  Name Primary?  . Essential hypertension, benign Yes  . Obesity, unspecified   . Cigarette nicotine dependence without complication   . Screening cholesterol level   .  Screening for diabetes mellitus   . Screening, anemia, deficiency, iron      -get baseline labs -pt told to call when gets home to let us know what rx she is taking for bp so refill can be sent in -f/u OV 1 month.  RTO sooner prn -Pt called back from home and said she was on amlodipine. [facility]

## 2016-08-30 ENCOUNTER — Encounter: Payer: Self-pay | Admitting: Physician Assistant

## 2016-08-30 ENCOUNTER — Ambulatory Visit: Payer: Self-pay | Admitting: Physician Assistant

## 2016-08-30 VITALS — BP 120/90 | HR 90 | Temp 97.9°F | Ht 63.0 in | Wt 184.0 lb

## 2016-08-30 DIAGNOSIS — F17210 Nicotine dependence, cigarettes, uncomplicated: Secondary | ICD-10-CM

## 2016-08-30 DIAGNOSIS — E669 Obesity, unspecified: Secondary | ICD-10-CM

## 2016-08-30 DIAGNOSIS — I10 Essential (primary) hypertension: Secondary | ICD-10-CM

## 2016-08-30 DIAGNOSIS — R21 Rash and other nonspecific skin eruption: Secondary | ICD-10-CM

## 2016-08-30 MED ORDER — AMLODIPINE BESYLATE 10 MG PO TABS: 10.0000 mg | ORAL_TABLET | Freq: Every day | ORAL | 1 refills | Status: DC

## 2016-08-30 MED ORDER — TRIAMCINOLONE ACETONIDE 0.1 % EX CREA: 1.0000 "application " | TOPICAL_CREAM | Freq: Two times a day (BID) | CUTANEOUS | 0 refills | Status: DC

## 2016-08-30 NOTE — Progress Notes (Signed)
BP 120/90 (BP Location: Left Arm, Patient Position: Sitting, Cuff Size: Normal)   Pulse 90   Temp 97.9 F (36.6 C)   Ht 5\' 3"  (1.6 m)   Wt 184 lb (83.5 kg)   SpO2 98%   BMI 32.59 kg/m    Subjective:    Patient ID: Stephanie Cook, female    DOB: 1986-06-06, 30 y.o.   MRN: [id number]  HPI: Stephanie McgregorBrittany D Ingraham is a 30 y.o. female presenting on 08/30/2016 for Hypertension   HPI   Pt did not get labs drawn.  Pt is doing well today but c/o rash axilla that she has had for several weeks and says it burns when she applies deodorant.  She has not changed her deodorant nor her soap.  Relevant past medical, surgical, family and social history reviewed and updated as indicated. Interim medical history since our last visit reviewed. Allergies and medications reviewed and updated.  CURRENT MEDICATIONS: Amlodipine 10mg  qd Depo-provera  Review of Systems  Constitutional: Negative for appetite change, chills, diaphoresis, fatigue, fever and unexpected weight change.  HENT: Positive for sneezing and sore throat. Negative for congestion, drooling, ear pain, facial swelling, hearing loss, mouth sores, trouble swallowing and voice change.   Eyes: Negative for pain, discharge, redness, itching and visual disturbance.  Respiratory: Positive for cough and wheezing. Negative for choking and shortness of breath.   Cardiovascular: Negative for chest pain, palpitations and leg swelling.  Gastrointestinal: Positive for constipation. Negative for abdominal pain, blood in stool, diarrhea and vomiting.  Endocrine: Negative for cold intolerance, heat intolerance and polydipsia.  Genitourinary: Negative for decreased urine volume, dysuria and hematuria.  Musculoskeletal: Positive for back pain. Negative for arthralgias and gait problem.  Skin: Positive for rash.  Allergic/Immunologic: Negative for environmental allergies.  Neurological: Positive for headaches. Negative for seizures, syncope and  light-headedness.  Hematological: Negative for adenopathy.  Psychiatric/Behavioral: Negative for agitation, dysphoric mood and suicidal ideas. The patient is not nervous/anxious.     Per HPI unless specifically indicated above     Objective:    BP 120/90 (BP Location: Left Arm, Patient Position: Sitting, Cuff Size: Normal)   Pulse 90   Temp 97.9 F (36.6 C)   Ht 5\' 3"  (1.6 m)   Wt 184 lb (83.5 kg)   SpO2 98%   BMI 32.59 kg/m   Wt Readings from Last 3 Encounters:  08/30/16 184 lb (83.5 kg)  08/01/16 184 lb (83.5 kg)  04/20/16 188 lb (85.3 kg)    Physical Exam  Constitutional: She is oriented to person, place, and time. She appears well-developed and well-nourished.  HENT:  Head: Normocephalic and atraumatic.  Neck: Neck supple.  Cardiovascular: Normal rate and regular rhythm.   Pulmonary/Chest: Effort normal and breath sounds normal.  Abdominal: Soft. Bowel sounds are normal. She exhibits no mass. There is no hepatosplenomegaly. There is no tenderness.  Musculoskeletal: She exhibits no edema.  Lymphadenopathy:    She has no cervical adenopathy.  Neurological: She is alert and oriented to person, place, and time.  Skin: Skin is warm and dry. Rash noted.  Thickened dark patch bilateral axilla, more prominent L axilla.  No papules or vesicles. No erythema.    Psychiatric: She has a normal mood and affect. Her behavior is normal.  Vitals reviewed.       Assessment & Plan:   Encounter Diagnoses  Name Primary?  . Essential hypertension, benign Yes  . Rash and nonspecific skin eruption   . Obesity, unspecified   .  Cigarette nicotine dependence without complication     -rx TAC for rash -counseled pt to get baseline labs ordered at new pt appointment -f/u 1 month to review labs and recheck rash

## 2016-09-21 LAB — LIPID PANEL
CHOL/HDL RATIO: 2.7 ratio (ref <=5.0)
Cholesterol: 186 mg/dL (ref 125–200)
HDL: 68 mg/dL (ref >=46)
LDL Cholesterol: 98 mg/dL (ref <130)
TRIGLYCERIDES: 99 mg/dL (ref <150)
VLDL: 20 mg/dL (ref <30)

## 2016-09-21 LAB — COMPLETE METABOLIC PANEL WITH GFR
ALT: 14 U/L (ref 6–29)
AST: 19 U/L (ref 10–30)
Albumin: 4.4 g/dL (ref 3.6–5.1)
Alkaline Phosphatase: 56 U/L (ref 33–115)
BUN: 8 mg/dL (ref 7–25)
CHLORIDE: 108 mmol/L (ref 98–110)
CO2: 24 mmol/L (ref 20–31)
CREATININE: 0.74 mg/dL (ref 0.50–1.10)
Calcium: 9.4 mg/dL (ref 8.6–10.2)
GFR, Est African American: >89 mL/min (ref >=60)
GFR, Est Non African American: >89 mL/min (ref >=60)
Glucose, Bld: 92 mg/dL (ref 65–99)
POTASSIUM: 4.5 mmol/L (ref 3.5–5.3)
Sodium: 140 mmol/L (ref 135–146)
Total Bilirubin: 0.8 mg/dL (ref 0.2–1.2)
Total Protein: 7.3 g/dL (ref 6.1–8.1)

## 2016-09-21 LAB — CBC WITH DIFFERENTIAL/PLATELET
BASOS PCT: 0 %
Basophils Absolute: 0 cells/uL (ref 0–200)
EOS PCT: 5 %
Eosinophils Absolute: 530 cells/uL — ABNORMAL HIGH (ref 15–500)
HCT: 39 % (ref 35.0–45.0)
Hemoglobin: 13.1 g/dL (ref 11.7–15.5)
LYMPHS PCT: 34 %
Lymphs Abs: 3604 cells/uL (ref 850–3900)
MCH: 29 pg (ref 27.0–33.0)
MCHC: 33.6 g/dL (ref 32.0–36.0)
MCV: 86.3 fL (ref 80.0–100.0)
MONOS PCT: 6 %
MPV: 9.5 fL (ref 7.5–12.5)
Monocytes Absolute: 636 cells/uL (ref 200–950)
Neutro Abs: 5830 cells/uL (ref 1500–7800)
Neutrophils Relative %: 55 %
PLATELETS: 250 10*3/uL (ref 140–400)
RBC: 4.52 MIL/uL (ref 3.80–5.10)
RDW: 13.6 % (ref 11.0–15.0)
WBC: 10.6 10*3/uL (ref 3.8–10.8)

## 2016-09-22 LAB — HEMOGLOBIN A1C
HEMOGLOBIN A1C: 5.2 % (ref <5.7)
MEAN PLASMA GLUCOSE: 103 mg/dL

## 2016-09-27 ENCOUNTER — Ambulatory Visit: Payer: Self-pay | Admitting: Physician Assistant

## 2016-09-27 ENCOUNTER — Encounter: Payer: Self-pay | Admitting: Physician Assistant

## 2016-09-27 VITALS — BP 118/84 | HR 93 | Temp 97.9°F | Ht 63.0 in | Wt 188.5 lb

## 2016-09-27 DIAGNOSIS — E669 Obesity, unspecified: Secondary | ICD-10-CM

## 2016-09-27 DIAGNOSIS — I10 Essential (primary) hypertension: Secondary | ICD-10-CM

## 2016-09-27 DIAGNOSIS — F17210 Nicotine dependence, cigarettes, uncomplicated: Secondary | ICD-10-CM

## 2016-09-27 DIAGNOSIS — R21 Rash and other nonspecific skin eruption: Secondary | ICD-10-CM

## 2016-09-27 DIAGNOSIS — Z6833 Body mass index (BMI) 33.0-33.9, adult: Secondary | ICD-10-CM

## 2016-09-27 MED ORDER — TRIAMCINOLONE ACETONIDE 0.1 % EX CREA
1.0000 | TOPICAL_CREAM | Freq: Two times a day (BID) | CUTANEOUS | 0 refills | Status: DC | PRN
Start: 2016-09-27 — End: 2016-10-25

## 2016-09-27 NOTE — Progress Notes (Signed)
 BP 118/84 (BP Location: Left Arm, Patient Position: Sitting, Cuff Size: Normal)   Pulse 93   Temp 97.9 F (36.6 C) (Other (Comment))   Ht 5' 3" (1.6 m)   Wt 188 lb 8 oz (85.5 kg)   SpO2 98%   BMI 33.39 kg/m    Subjective:    Patient ID: Stephanie Cook, female    DOB: 06/10/1986, 30 y.o.   MRN: [id number]  HPI: Stephanie Cook is a 30 y.o. female presenting on 09/27/2016 for Hypertension; Rash (still present but is getting better); and Follow-up (labs)   HPI   Pt is doing well today.  She didn't get the TAC because it was too expensive for her.  Relevant past medical, surgical, family and social history reviewed and updated as indicated. Interim medical history since our last visit reviewed. Allergies and medications reviewed and updated.  CURRENT MEDICATIONS: Amlodipine Depo-provera  Review of Systems  Constitutional: Negative for appetite change, chills, diaphoresis, fatigue, fever and unexpected weight change.  HENT: Negative for congestion, drooling, ear pain, facial swelling, hearing loss, mouth sores, sneezing, sore throat, trouble swallowing and voice change.   Eyes: Negative for pain, discharge, redness, itching and visual disturbance.  Respiratory: Negative for cough, choking, shortness of breath and wheezing.   Cardiovascular: Negative for chest pain, palpitations and leg swelling.  Gastrointestinal: Negative for abdominal pain, blood in stool, constipation, diarrhea and vomiting.  Endocrine: Negative for cold intolerance, heat intolerance and polydipsia.  Genitourinary: Negative for decreased urine volume, dysuria and hematuria.  Musculoskeletal: Negative for arthralgias, back pain and gait problem.  Skin: Positive for rash.  Allergic/Immunologic: Negative for environmental allergies.  Neurological: Negative for seizures, syncope, light-headedness and headaches.  Hematological: Negative for adenopathy.  Psychiatric/Behavioral: Negative for agitation,  dysphoric mood and suicidal ideas. The patient is not nervous/anxious.     Per HPI unless specifically indicated above     Objective:    BP 118/84 (BP Location: Left Arm, Patient Position: Sitting, Cuff Size: Normal)   Pulse 93   Temp 97.9 F (36.6 C) (Other (Comment))   Ht 5' 3" (1.6 m)   Wt 188 lb 8 oz (85.5 kg)   SpO2 98%   BMI 33.39 kg/m   Wt Readings from Last 3 Encounters:  09/27/16 188 lb 8 oz (85.5 kg)  08/30/16 184 lb (83.5 kg)  08/01/16 184 lb (83.5 kg)    Physical Exam  Constitutional: She is oriented to person, place, and time. She appears well-developed and well-nourished.  HENT:  Head: Normocephalic and atraumatic.  Neck: Neck supple.  Cardiovascular: Normal rate and regular rhythm.   Pulmonary/Chest: Effort normal and breath sounds normal.  Abdominal: Soft. Bowel sounds are normal. She exhibits no mass. There is no hepatosplenomegaly. There is no tenderness.  Musculoskeletal: She exhibits no edema.  Lymphadenopathy:    She has no cervical adenopathy.  Neurological: She is alert and oriented to person, place, and time.  Skin: Skin is warm and dry.  Rash bilateral axilla, L>R. Thickened hyperpigmented skin. No discrete lesions  Psychiatric: She has a normal mood and affect. Her behavior is normal.  Vitals reviewed.   Results for orders placed or performed in visit on 08/01/16  CBC w/Diff/Platelet  Result Value Ref Range   WBC 10.6 3.8 - 10.8 K/uL   RBC 4.52 3.80 - 5.10 MIL/uL   Hemoglobin 13.1 11.7 - 15.5 g/dL   HCT 39.0 35.0 - 45.0 %   MCV 86.3 80.0 - 100.0 fL     MCH 29.0 27.0 - 33.0 pg   MCHC 33.6 32.0 - 36.0 g/dL   RDW 13.6 11.0 - 15.0 %   Platelets 250 140 - 400 K/uL   MPV 9.5 7.5 - 12.5 fL   Neutro Abs 5,830 1,500 - 7,800 cells/uL   Lymphs Abs 3,604 850 - 3,900 cells/uL   Monocytes Absolute 636 200 - 950 cells/uL   Eosinophils Absolute 530 (H) 15 - 500 cells/uL   Basophils Absolute 0 0 - 200 cells/uL   Neutrophils Relative % 55 %    Lymphocytes Relative 34 %   Monocytes Relative 6 %   Eosinophils Relative 5 %   Basophils Relative 0 %   Smear Review Criteria for review not met   COMPLETE METABOLIC PANEL WITH GFR  Result Value Ref Range   Sodium 140 135 - 146 mmol/L   Potassium 4.5 3.5 - 5.3 mmol/L   Chloride 108 98 - 110 mmol/L   CO2 24 20 - 31 mmol/L   Glucose, Bld 92 65 - 99 mg/dL   BUN 8 7 - 25 mg/dL   Creat 0.74 0.50 - 1.10 mg/dL   Total Bilirubin 0.8 0.2 - 1.2 mg/dL   Alkaline Phosphatase 56 33 - 115 U/L   AST 19 10 - 30 U/L   ALT 14 6 - 29 U/L   Total Protein 7.3 6.1 - 8.1 g/dL   Albumin 4.4 3.6 - 5.1 g/dL   Calcium 9.4 8.6 - 10.2 mg/dL   GFR, Est African American >89 >=60 mL/min   GFR, Est Non African American >89 >=60 mL/min  Lipid Profile  Result Value Ref Range   Cholesterol 186 125 - 200 mg/dL   Triglycerides 99 <150 mg/dL   HDL 68 >=46 mg/dL   Total CHOL/HDL Ratio 2.7 <=5.0 Ratio   VLDL 20 <30 mg/dL   LDL Cholesterol 98 <130 mg/dL  HgB A1c  Result Value Ref Range   Hgb A1c MFr Bld 5.2 <5.7 %   Mean Plasma Glucose 103 mg/dL      Assessment & Plan:   Encounter Diagnoses  Name Primary?  . Essential hypertension, benign Yes  . Rash and nonspecific skin eruption   . Cigarette nicotine dependence without complication   . Class 1 obesity with body mass index (BMI) of 33.0 to 33.9 in adult, unspecified obesity type, unspecified whether serious comorbidity present     -Reviewed labs with pt -pt to Continue amlodipine -Gave Rx TAC to get on $4 at [facility] -counseled smoking cessation -F/u 3 months. RTO sooner  prn  

## 2016-10-25 ENCOUNTER — Ambulatory Visit (INDEPENDENT_AMBULATORY_CARE_PROVIDER_SITE_OTHER): Payer: Medicaid Other | Admitting: *Deleted

## 2016-10-25 ENCOUNTER — Encounter: Payer: Self-pay | Admitting: *Deleted

## 2016-10-25 DIAGNOSIS — Z308 Encounter for other contraceptive management: Secondary | ICD-10-CM

## 2016-10-25 DIAGNOSIS — Z3202 Encounter for pregnancy test, result negative: Secondary | ICD-10-CM

## 2016-10-25 LAB — POCT URINE PREGNANCY: PREG TEST UR: NEGATIVE

## 2016-10-25 MED ORDER — MEDROXYPROGESTERONE ACETATE 150 MG/ML IM SUSP
150.0000 mg | Freq: Once | INTRAMUSCULAR | Status: AC
  Administered 2016-10-25: 150 mg via INTRAMUSCULAR

## 2016-10-25 NOTE — Progress Notes (Signed)
Pt here for Depo. Pt tolerated shot well. Return in 12 weeks for next shot. JSY 

## 2016-11-27 ENCOUNTER — Emergency Department (HOSPITAL_COMMUNITY)
Admission: EM | Admit: 2016-11-27 | Discharge: 2016-11-27 | Disposition: A | Discharge status: Home / Self Care | Payer: Medicaid Other | Attending: Emergency Medicine | Admitting: Emergency Medicine

## 2016-11-27 ENCOUNTER — Encounter (HOSPITAL_COMMUNITY): Payer: Self-pay | Admitting: Emergency Medicine

## 2016-11-27 DIAGNOSIS — Z79899 Other long term (current) drug therapy: Secondary | ICD-10-CM | POA: Insufficient documentation

## 2016-11-27 DIAGNOSIS — F17210 Nicotine dependence, cigarettes, uncomplicated: Secondary | ICD-10-CM | POA: Insufficient documentation

## 2016-11-27 DIAGNOSIS — L0231 Cutaneous abscess of buttock: Secondary | ICD-10-CM | POA: Insufficient documentation

## 2016-11-27 DIAGNOSIS — I10 Essential (primary) hypertension: Secondary | ICD-10-CM | POA: Insufficient documentation

## 2016-11-27 MED ORDER — LIDOCAINE HCL (PF) 2 % IJ SOLN
INTRAMUSCULAR | Status: AC
  Filled 2016-11-27: qty 10

## 2016-11-27 MED ORDER — POVIDONE-IODINE 10 % EX SOLN
CUTANEOUS | Status: AC
  Filled 2016-11-27: qty 118

## 2016-11-27 MED ORDER — OXYCODONE-ACETAMINOPHEN 5-325 MG PO TABS
2.0000 | ORAL_TABLET | Freq: Once | ORAL | Status: AC
  Administered 2016-11-27: 2 via ORAL
  Filled 2016-11-27: qty 2

## 2016-11-27 MED ORDER — HYDROCODONE-ACETAMINOPHEN 5-325 MG PO TABS: 2.0000 | ORAL_TABLET | ORAL | 0 refills | Status: DC | PRN

## 2016-11-27 MED ORDER — SULFAMETHOXAZOLE-TRIMETHOPRIM 800-160 MG PO TABS: 1.0000 | ORAL_TABLET | Freq: Two times a day (BID) | ORAL | 0 refills | Status: AC

## 2016-11-27 NOTE — ED Notes (Signed)
Peri pad and mesh panties given.

## 2016-11-27 NOTE — Discharge Instructions (Signed)
Pull packing out in 2 days.  Return if any problems.

## 2016-11-27 NOTE — ED Provider Notes (Signed)
[facility]Y DEPT Provider Note   CSN: [id number] Arrival date & time: 11/27/16  1635  By signing my name below, I, Stephanie Cook, attest that this documentation has been prepared under the direction and in the presence of Langston MaskerKaren Lathyn Griggs, [place]PA-C.  Electronically Signed: Rosario AdieWilliam Andrew Cook, ED Scribe. 11/27/16. 5:36 PM.  History   Chief Complaint Chief Complaint  Patient presents with  . Abscess   The history is provided by the patient. No language interpreter was used.    HPI Comments: Stephanie Cook is a 30 y.o. female who presents to the Emergency Department complaining of a moderate, gradually worsening area of pain and swelling to the left buttock onset approximately 3 days ago. Pt states pain is exacerbated with palpation and direct pressure. No treatments for her symptoms were tried prior to coming into the ED. Denies fever, chills, drainage from the area, or any other associated symptoms.   Past Medical History:  Diagnosis Date  . Depression   . Frequent urination 09/28/2015  . HSV-2 (herpes simplex virus 2) infection   . Hx of chlamydia infection    multiple times  . Hx of gonorrhea   . Hx of trichomoniasis    recurrent, 4 times  . Hypertension   . Missed period 09/28/2015  . Pregnant   . Preterm labor   . STD (female)    chlamydia, trichomonas, gonorrhea, HPV, HSV   Patient Active Problem List   Diagnosis Date Noted  . Essential hypertension, benign 08/01/2016  . Obesity 08/01/2016  . Cigarette nicotine dependence without complication 08/01/2016  . Missed period 09/28/2015  . Frequent urination 09/28/2015  . Major depressive disorder, single episode, severe, specified as with psychotic behavior 03/12/2014  . 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 Mood disorder, drug-induced (HCC) 03/11/2014  . Depression 11/25/2013  . Anemia in pregnancy 09/02/2013  . Benign essential hypertension antepartum 06/11/2013    . H/O preterm delivery, currently pregnant 03/18/2013  . HSV-2 infection 03/18/2013   History reviewed. No pertinent surgical history.  OB History    Gravida Para Term Preterm AB Living   5 4 2 2 1 4    SAB TAB Ectopic Multiple Live Births   1 0 0 0 4     Home Medications    Prior to Admission medications   Medication Sig Start Date End Date Taking? Authorizing Provider  amLODipine (NORVASC) 10 MG tablet Take 1 tablet (10 mg total) by mouth daily. 08/30/16   Jacquelin HawkingShannon McElroy, PA-C  medroxyPROGESTERone (DEPO-PROVERA) 150 MG/ML injection Inject 1 mL (150 mg total) into the muscle every 3 (three) months. 05/02/16   Tilda BurrowJohn V Ferguson, MD   Family History Family History  Problem Relation Age of Onset  . Lupus Mother   . Cirrhosis Mother   . Hypertension Maternal Grandmother    Social History Social History  Substance Use Topics  . Smoking status: Current Every Day Smoker    Packs/day: 0.50    Years: 8.00    Types: Cigarettes  . Smokeless tobacco: Never Used  . Alcohol use Yes     Comment: occasionally   Allergies   Motrin [ibuprofen]  Review of Systems Review of Systems  Constitutional: Negative for chills and fever.  Musculoskeletal: Positive for myalgias.  All other systems reviewed and are negative.  Physical Exam Updated Vital Signs BP 133/91 (BP Location: Left Arm)   Pulse 111   Temp 98.5 F (36.9 C) (Oral)   Resp 14  Ht 5\' 3"  (1.6 m)   Wt 188 lb (85.3 kg)   LMP 11/01/2016 (Approximate)   SpO2 99%   BMI 33.30 kg/m   Physical Exam  Constitutional: She appears well-developed and well-nourished. No distress.  HENT:  Head: Normocephalic and atraumatic.  Eyes: Conjunctivae are normal.  Neck: Normal range of motion.  Cardiovascular: Normal rate.   Pulmonary/Chest: Effort normal.  Abdominal: She exhibits no distension.  Musculoskeletal: Normal range of motion.  Neurological: She is alert.  Skin: No pallor.  3x3cm firm area on the left buttock.    Psychiatric: She has a normal mood and affect. Her behavior is normal.  Nursing note and vitals reviewed.  ED Treatments / Results  DIAGNOSTIC STUDIES: Oxygen Saturation is 99% on RA, normal by my interpretation.   COORDINATION OF CARE: 5:36 PM-Discussed next steps with pt. Pt verbalized understanding and is agreeable with the plan.   Procedures Procedures   INCISION AND DRAINAGE PROCEDURE NOTE: Patient identification was confirmed and verbal consent was obtained. This procedure was performed by Langston MaskerKaren Ardian Haberland, PA-C at 6:28 PM. Site: left-buttock Sterile procedures observed Anesthetic used (type and amt): lidocaine 2% w/o epinephrine Blade size: 11 Drainage: purulent, gray, malodorous, large quantity  Complexity: Complex Packing used: 1.5in guaze  Site anesthetized, incision made over site, wound drained and explored loculations, rinsed with copious amounts of normal saline, wound packed with sterile gauze, covered with dry, sterile dressing. Pt tolerated procedure well without complications. Instructions for care discussed verbally and pt provided with additional written instructions for homecare and f/u.  Medications Ordered in ED Medications - No data to display  Initial Impression / Assessment and Plan / ED Course  I have reviewed the triage vital signs and the nursing notes.  Clinical Course    Pt is a 30yoF who presents to the Emergency Department with skin abscess amenable to incision and drainage. No signs of cellulitis surrounding skin. Given Rx Abx, Will d/c to home. Pt is comfortable with above plan and is stable for discharge at this time. All questions were answered prior to disposition. Strict return precautions for return into the ED were discussed.   Final Clinical Impressions(s) / ED Diagnoses   Final diagnoses:  Abscess of left buttock   New Prescriptions Discharge Medication List as of 11/27/2016  7:02 PM    START taking these medications   Details   HYDROcodone-acetaminophen (NORCO/VICODIN) 5-325 MG tablet Take 2 tablets by mouth every 4 (four) hours as needed., Starting Mon 11/27/2016, Print    sulfamethoxazole-trimethoprim (BACTRIM DS,SEPTRA DS) 800-160 MG tablet Take 1 tablet by mouth 2 (two) times daily., Starting Mon 11/27/2016, Until Mon 12/04/2016, Print       An After Visit Summary was printed and given to the patient.  I personally performed the services in this documentation, which was scribed in my presence.  The recorded information has been reviewed and considered.   Barnet PallKaren SofiaPAC.   Lonia SkinnerLeslie K SistersvilleSofia, PA-C 11/27/16 2159    Jacalyn LefevreJulie Haviland, MD 11/27/16 2217

## 2016-11-27 NOTE — ED Triage Notes (Signed)
Patient complaining of abscess to left buttock area x 3 days. Denies draining.

## 2016-12-27 ENCOUNTER — Ambulatory Visit: Payer: Self-pay | Admitting: Physician Assistant

## 2017-01-02 ENCOUNTER — Encounter: Payer: Self-pay | Admitting: Physician Assistant

## 2017-01-10 ENCOUNTER — Encounter: Payer: Self-pay | Admitting: Physician Assistant

## 2017-01-10 ENCOUNTER — Ambulatory Visit: Payer: Self-pay | Admitting: Physician Assistant

## 2017-01-10 VITALS — BP 130/88 | HR 82 | Temp 97.9°F | Ht 63.0 in

## 2017-01-10 DIAGNOSIS — F17210 Nicotine dependence, cigarettes, uncomplicated: Secondary | ICD-10-CM

## 2017-01-10 DIAGNOSIS — I10 Essential (primary) hypertension: Secondary | ICD-10-CM

## 2017-01-10 MED ORDER — AMLODIPINE BESYLATE 10 MG PO TABS: 10.0000 mg | ORAL_TABLET | Freq: Every day | ORAL | 3 refills | Status: DC

## 2017-01-10 NOTE — Progress Notes (Signed)
BP 130/88 (BP Location: Left Arm, Patient Position: Sitting, Cuff Size: Large)   Pulse 82   Temp 97.9 F (36.6 C) (Other (Comment))   Ht 5\' 3"  (1.6 m)   SpO2 98%    Subjective:    Patient ID: Stephanie Cook, female    DOB: 1986-06-24, 31 y.o.   MRN: [id number]  HPI: Stephanie Cook is a 31 y.o. female presenting on 01/10/2017 for Hypertension   HPI   [facility]  Says she is doing okay without it.   Pt ran out of amlodipine before the snow , so over a week ago  She is still smoking  Pt says she feels well  Relevant past medical, surgical, family and social history reviewed and updated as indicated. Interim medical history since our last visit reviewed. Allergies and medications reviewed and updated.   Current Outpatient Prescriptions:  .  medroxyPROGESTERone (DEPO-PROVERA) 150 MG/ML injection, Inject 1 mL (150 mg total) into the muscle every 3 (three) months., Disp: 1 mL, Rfl: 4 .  amLODipine (NORVASC) 10 MG tablet, Take 1 tablet (10 mg total) by mouth daily. (Patient not taking: Reported on 01/10/2017), Disp: 30 tablet, Rfl: 1  Review of Systems  Constitutional: Negative for appetite change, chills, diaphoresis, fatigue, fever and unexpected weight change.  HENT: Negative for congestion, dental problem, drooling, ear pain, facial swelling, hearing loss, mouth sores, sneezing, sore throat, trouble swallowing and voice change.   Eyes: Negative for pain, discharge, redness, itching and visual disturbance.  Respiratory: Negative for cough, choking, shortness of breath and wheezing.   Cardiovascular: Negative for chest pain, palpitations and leg swelling.  Gastrointestinal: Negative for abdominal pain, blood in stool, constipation, diarrhea and vomiting.  Endocrine: Negative for cold intolerance, heat intolerance and polydipsia.  Genitourinary: Negative for decreased urine volume, dysuria and hematuria.  Musculoskeletal: Negative for  arthralgias, back pain and gait problem.  Skin: Negative for rash.  Allergic/Immunologic: Negative for environmental allergies.  Neurological: Negative for seizures, syncope, light-headedness and headaches.  Hematological: Negative for adenopathy.  Psychiatric/Behavioral: Negative for agitation, dysphoric mood and suicidal ideas. The patient is not nervous/anxious.     Per HPI unless specifically indicated above     Objective:    BP 130/88 (BP Location: Left Arm, Patient Position: Sitting, Cuff Size: Large)   Pulse 82   Temp 97.9 F (36.6 C) (Other (Comment))   Ht 5\' 3"  (1.6 m)   SpO2 98%   Wt Readings from Last 3 Encounters:  11/27/16 188 lb (85.3 kg)  09/27/16 188 lb 8 oz (85.5 kg)  08/30/16 184 lb (83.5 kg)    Physical Exam  Constitutional: She is oriented to person, place, and time. She appears well-developed and well-nourished.  HENT:  Head: Normocephalic and atraumatic.  Neck: Neck supple.  Cardiovascular: Normal rate and regular rhythm.   Pulmonary/Chest: Effort normal and breath sounds normal.  Abdominal: Soft. Bowel sounds are normal. She exhibits no mass. There is no hepatosplenomegaly. There is no tenderness.  Musculoskeletal: She exhibits no edema.  Lymphadenopathy:    She has no cervical adenopathy.  Neurological: She is alert and oriented to person, place, and time.  Skin: Skin is warm and dry.  Psychiatric: She has a normal mood and affect. Her behavior is normal.  Vitals reviewed.       Assessment & Plan:   Encounter Diagnoses  Name Primary?  . Essential hypertension, benign Yes  . Cigarette nicotine dependence without complication      -  counseled pt to get back on her amlodipine -counseled smoking cessation -follow up 3 months.  RTO sooner prn

## 2017-01-17 ENCOUNTER — Ambulatory Visit: Payer: Medicaid Other

## 2017-01-22 ENCOUNTER — Ambulatory Visit: Payer: Medicaid Other

## 2017-02-11 ENCOUNTER — Encounter (HOSPITAL_COMMUNITY): Payer: Self-pay | Admitting: Emergency Medicine

## 2017-02-11 ENCOUNTER — Emergency Department (HOSPITAL_COMMUNITY)
Admission: EM | Admit: 2017-02-11 | Discharge: 2017-02-11 | Disposition: A | Discharge status: Home / Self Care | Payer: Medicaid Other | Attending: Emergency Medicine | Admitting: Emergency Medicine

## 2017-02-11 DIAGNOSIS — Z79899 Other long term (current) drug therapy: Secondary | ICD-10-CM | POA: Insufficient documentation

## 2017-02-11 DIAGNOSIS — F17210 Nicotine dependence, cigarettes, uncomplicated: Secondary | ICD-10-CM | POA: Insufficient documentation

## 2017-02-11 DIAGNOSIS — I10 Essential (primary) hypertension: Secondary | ICD-10-CM | POA: Insufficient documentation

## 2017-02-11 DIAGNOSIS — R1084 Generalized abdominal pain: Secondary | ICD-10-CM | POA: Insufficient documentation

## 2017-02-11 LAB — COMPREHENSIVE METABOLIC PANEL
ALT: 12 U/L — ABNORMAL LOW (ref 14–54)
AST: 18 U/L (ref 15–41)
Albumin: 4.2 g/dL (ref 3.5–5.0)
Alkaline Phosphatase: 57 U/L (ref 38–126)
Anion gap: 6 (ref 5–15)
BUN: 13 mg/dL (ref 6–20)
CO2: 25 mmol/L (ref 22–32)
Calcium: 9.1 mg/dL (ref 8.9–10.3)
Chloride: 104 mmol/L (ref 101–111)
Creatinine, Ser: 0.62 mg/dL (ref 0.44–1.00)
GFR calc Af Amer: >60 mL/min (ref >60)
GFR calc non Af Amer: >60 mL/min (ref >60)
Glucose, Bld: 109 mg/dL — ABNORMAL HIGH (ref 65–99)
Potassium: 3.9 mmol/L (ref 3.5–5.1)
Sodium: 135 mmol/L (ref 135–145)
Total Bilirubin: 0.5 mg/dL (ref 0.3–1.2)
Total Protein: 8.1 g/dL (ref 6.5–8.1)

## 2017-02-11 LAB — URINALYSIS, ROUTINE W REFLEX MICROSCOPIC
Bilirubin Urine: NEGATIVE
Glucose, UA: NEGATIVE mg/dL
Hgb urine dipstick: NEGATIVE
Ketones, ur: NEGATIVE mg/dL
Leukocytes, UA: NEGATIVE
Nitrite: NEGATIVE
Protein, ur: NEGATIVE mg/dL
Specific Gravity, Urine: 1.016 (ref 1.005–1.030)
pH: 6 (ref 5.0–8.0)

## 2017-02-11 LAB — CBC
HCT: 40.4 % (ref 36.0–46.0)
Hemoglobin: 13.5 g/dL (ref 12.0–15.0)
MCH: 29.2 pg (ref 26.0–34.0)
MCHC: 33.4 g/dL (ref 30.0–36.0)
MCV: 87.3 fL (ref 78.0–100.0)
Platelets: 225 10*3/uL (ref 150–400)
RBC: 4.63 MIL/uL (ref 3.87–5.11)
RDW: 13.2 % (ref 11.5–15.5)
WBC: 11.6 10*3/uL — ABNORMAL HIGH (ref 4.0–10.5)

## 2017-02-11 LAB — LIPASE, BLOOD: Lipase: 19 U/L (ref 11–51)

## 2017-02-11 LAB — PREGNANCY, URINE: Preg Test, Ur: NEGATIVE

## 2017-02-11 MED ORDER — DICYCLOMINE HCL 20 MG PO TABS: 20.0000 mg | ORAL_TABLET | Freq: Three times a day (TID) | ORAL | 0 refills | Status: DC | PRN

## 2017-02-11 MED ORDER — ONDANSETRON 4 MG PO TBDP
4.0000 mg | ORAL_TABLET | Freq: Once | ORAL | Status: AC
  Administered 2017-02-11: 4 mg via ORAL
  Filled 2017-02-11: qty 1

## 2017-02-11 MED ORDER — PANTOPRAZOLE SODIUM 40 MG PO TBEC
40.0000 mg | DELAYED_RELEASE_TABLET | Freq: Once | ORAL | Status: AC
  Administered 2017-02-11: 40 mg via ORAL
  Filled 2017-02-11: qty 1

## 2017-02-11 MED ORDER — GI COCKTAIL ~~LOC~~
30.0000 mL | Freq: Once | ORAL | Status: AC
  Administered 2017-02-11: 30 mL via ORAL
  Filled 2017-02-11: qty 30

## 2017-02-11 MED ORDER — PANTOPRAZOLE SODIUM 20 MG PO TBEC: 20.0000 mg | DELAYED_RELEASE_TABLET | Freq: Two times a day (BID) | ORAL | 0 refills | Status: DC

## 2017-02-11 NOTE — ED Notes (Signed)
Pt reports she had a normal BM today, one episode of emesis due to coughing. Denies urinary sx.

## 2017-02-11 NOTE — ED Provider Notes (Signed)
[facility] Provider Note   CSN: [id number] Arrival date & time: 02/11/17  1036   By signing my name below, I, Stephanie Cook, attest that this documentation has been prepared under the direction and in the presence of Raeford RazorStephen Clark Clowdus, MD. Electronically Signed: Bobbie Stackhristopher Cook, Scribe. 02/11/17. 12:54 PM. History   Chief Complaint Chief Complaint  Patient presents with  . Abdominal Pain    The history is provided by the patient. No language interpreter was used.   HPI Comments: Stephanie Cook is a 31 y.o. female who presents to the Emergency Department complaining of intermittent, gradually worsening generalized abdominal pain for the past 3 days. She states that the pain began in her upper abdomen then radiated to her lower abdominal. She states that isn't worsened by eating. The pain is worse at night.  She reports nausea, cough, and vomit. She states that last night she was coughing so much in her sleep that she had an episode of vomiting. She denies back pain, diarrhea, dysuria, vaginal bleeding, and  fever. She denies a sour taste in her mouth. She states that she gets a salty taste in her mouth after coughing. The patient denies any past abdominal surgeries. She is unsure of whether she could be pregnant or not. Her LNMP was a month ago.  Past Medical History:  Diagnosis Date  . Depression   . Frequent urination 09/28/2015  . HSV-2 (herpes simplex virus 2) infection   . Hx of chlamydia infection    multiple times  . Hx of gonorrhea   . Hx of trichomoniasis    recurrent, 4 times  . Hypertension   . Missed period 09/28/2015  . Pregnant   . Preterm labor   . STD (female)    chlamydia, trichomonas, gonorrhea, HPV, HSV    Patient Active Problem List   Diagnosis Date Noted  . Essential hypertension, benign 08/01/2016  . Obesity 08/01/2016  . Cigarette nicotine dependence without complication 08/01/2016  . Missed period 09/28/2015  . Frequent urination  09/28/2015  . Major depressive disorder, single episode, severe, specified as with psychotic behavior 03/12/2014  . 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 Mood disorder, drug-induced (HCC) 03/11/2014  . Depression 11/25/2013  . Anemia in pregnancy 09/02/2013  . Benign essential hypertension antepartum 06/11/2013  . H/O preterm delivery, currently pregnant 03/18/2013  . HSV-2 infection 03/18/2013    History reviewed. No pertinent surgical history.  OB History    Gravida Para Term Preterm AB Living   5 4 2 2 1 4    SAB TAB Ectopic Multiple Live Births   1 0 0 0 4       Home Medications    Prior to Admission medications   Medication Sig Start Date End Date Taking? Authorizing Provider  amLODipine (NORVASC) 10 MG tablet Take 1 tablet (10 mg total) by mouth daily. 01/10/17   Jacquelin HawkingShannon McElroy, PA-C  medroxyPROGESTERone (DEPO-PROVERA) 150 MG/ML injection Inject 1 mL (150 mg total) into the muscle every 3 (three) months. 05/02/16   Tilda BurrowJohn Ferguson V, MD    Family History Family History  Problem Relation Age of Onset  . Lupus Mother   . Cirrhosis Mother   . Hypertension Maternal Grandmother     Social History Social History  Substance Use Topics  . Smoking status: Current Every Day Smoker    Packs/day: 0.50    Years: 8.00    Types: Cigarettes  . Smokeless tobacco: Never Used  . Alcohol use  Yes     Comment: occasionally     Allergies   Motrin [ibuprofen]   Review of Systems Review of Systems A complete 10 system review of systems was obtained and all systems are negative except as noted in the HPI and PMH.   Physical Exam Updated Vital Signs BP 136/96 (BP Location: Left Arm)   Pulse 80   Temp 98.7 F (37.1 C) (Oral)   Resp 18   Ht 5\' 3"  (1.6 m)   Wt 170 lb (77.1 kg)   SpO2 99%   BMI 30.11 kg/m   Physical Exam  Constitutional: She is oriented to person, place, and time. She appears well-developed and well-nourished.  No distress.  HENT:  Head: Normocephalic and atraumatic.  Eyes: EOM are normal.  Neck: Normal range of motion.  Cardiovascular: Normal rate, regular rhythm and normal heart sounds.   Pulmonary/Chest: Effort normal and breath sounds normal.  Abdominal: She exhibits no distension. There is tenderness.  Mild epigastric and RUQ tenderness.  Musculoskeletal: Normal range of motion.  Neurological: She is alert and oriented to person, place, and time.  Skin: Skin is warm and dry.  Psychiatric: She has a normal mood and affect. Judgment normal.  Nursing note and vitals reviewed.    ED Treatments / Results  DIAGNOSTIC STUDIES: Oxygen Saturation is 99% on RA, normal by my interpretation.    COORDINATION OF CARE: 12:37 PM Discussed treatment plan with pt at bedside and pt agreed to plan. I will check the patient's CT a/p and blood work.  Labs (all labs ordered are listed, but only abnormal results are displayed) Labs Reviewed  COMPREHENSIVE METABOLIC PANEL - Abnormal; Notable for the following:       Result Value   Glucose, Bld 109 (*)    ALT 12 (*)    All other components within normal limits  CBC - Abnormal; Notable for the following:    WBC 11.6 (*)    All other components within normal limits  LIPASE, BLOOD  URINALYSIS, ROUTINE W REFLEX MICROSCOPIC  PREGNANCY, URINE    EKG  EKG Interpretation None       Radiology No results found.  Procedures Procedures (including critical care time)  Medications Ordered in ED Medications  gi cocktail (Maalox,Lidocaine,Donnatal) (not administered)  ondansetron (ZOFRAN-ODT) disintegrating tablet 4 mg (not administered)  pantoprazole (PROTONIX) EC tablet 40 mg (not administered)     Initial Impression / Assessment and Plan / ED Course  I have reviewed the triage vital signs and the nursing notes.  Pertinent labs & imaging results that were available during my care of the patient were reviewed by me and considered in my medical  decision making (see chart for details).    \  Final Clinical Impressions(s) / ED Diagnoses   Final diagnoses:  Generalized abdominal pain    New Prescriptions New Prescriptions   No medications on file   I personally preformed the services scribed in my presence. The recorded information has been reviewed is accurate. Raeford Razor, MD.    Raeford Razor, MD 02/26/17 [id number]

## 2017-02-11 NOTE — ED Triage Notes (Signed)
Patient c/o generalized abd pain that started 3 days ago. Denies any nausea, vomiting, diarrhea, or urinary symptoms. Patient also reports cough. No menstrual period x1 month per patient.

## 2017-03-01 ENCOUNTER — Ambulatory Visit: Payer: Self-pay | Admitting: Physician Assistant

## 2017-03-13 ENCOUNTER — Encounter: Payer: Self-pay | Admitting: Physician Assistant

## 2017-04-03 ENCOUNTER — Other Ambulatory Visit: Payer: Self-pay

## 2017-04-03 DIAGNOSIS — I10 Essential (primary) hypertension: Secondary | ICD-10-CM

## 2017-04-10 ENCOUNTER — Encounter: Payer: Self-pay | Admitting: Physician Assistant

## 2017-04-10 ENCOUNTER — Ambulatory Visit: Payer: Self-pay | Admitting: Physician Assistant

## 2017-04-10 VITALS — BP 124/80 | HR 99 | Ht 63.0 in | Wt 207.0 lb

## 2017-04-10 DIAGNOSIS — I10 Essential (primary) hypertension: Secondary | ICD-10-CM

## 2017-04-10 MED ORDER — CHLORTHALIDONE 25 MG PO TABS: 25.0000 mg | ORAL_TABLET | Freq: Every day | ORAL | 1 refills | Status: DC

## 2017-04-10 NOTE — Progress Notes (Signed)
BP 124/80 (BP Location: Left Arm, Patient Position: Sitting, Cuff Size: Large)   Pulse 99   Ht  (1.6 m)   Wt 207 lb (93.9 kg)   SpO2 99%   BMI 36.67 kg/m    Subjective:    Patient ID: Stephanie Cook, female    DOB: 1986-07-07, 31 y.o.   MRN: [id number]  HPI: JANYTH RIERA is a 31 y.o. female presenting on 04/10/2017 for Hypertension   HPI   Pt stopped getting the depo shot because she says it messed up her menses.    She has 4 children already.  She is currently not using any contraception at all.  LMP was 2 months ago.  Her last depo shot was in october.  She took home preg test 1 month ago and it was negative.   Also, she has 20 pound weight gain since December.   Relevant past medical, surgical, family and social history reviewed and updated as indicated. Interim medical history since our last visit reviewed. Allergies and medications reviewed and updated.   Current Outpatient Prescriptions:  .  amLODipine (NORVASC) 10 MG tablet, Take 1 tablet (10 mg total) by mouth daily., Disp: 30 tablet, Rfl: 3 .  dicyclomine (BENTYL) 20 MG tablet, Take 1 tablet (20 mg total) by mouth 3 (three) times daily as needed (abdominal pain/cramps). (Patient not taking: Reported on 04/10/2017), Disp: 12 tablet, Rfl: 0 .  medroxyPROGESTERone (DEPO-PROVERA) 150 MG/ML injection, Inject 1 mL (150 mg total) into the muscle every 3 (three) months. (Patient not taking: Reported on 04/10/2017), Disp: 1 mL, Rfl: 4 .  pantoprazole (PROTONIX) 20 MG tablet, Take 1 tablet (20 mg total) by mouth 2 (two) times daily before a meal. (Patient not taking: Reported on 04/10/2017), Disp: 30 tablet, Rfl: 0   Review of Systems  Constitutional: Negative for appetite change, chills, diaphoresis, fatigue, fever and unexpected weight change.  HENT: Negative for congestion, dental problem, drooling, ear pain, facial swelling, hearing loss, mouth sores, sneezing, sore throat, trouble swallowing and voice change.    Eyes: Negative for pain, discharge, redness, itching and visual disturbance.  Respiratory: Negative for cough, choking, shortness of breath and wheezing.   Cardiovascular: Negative for chest pain, palpitations and leg swelling.  Gastrointestinal: Negative for abdominal pain, blood in stool, constipation, diarrhea and vomiting.  Endocrine: Negative for cold intolerance, heat intolerance and polydipsia.  Genitourinary: Negative for decreased urine volume, dysuria and hematuria.  Musculoskeletal: Positive for back pain. Negative for arthralgias and gait problem.  Skin: Negative for rash.  Allergic/Immunologic: Negative for environmental allergies.  Neurological: Negative for seizures, syncope, light-headedness and headaches.  Hematological: Negative for adenopathy.  Psychiatric/Behavioral: Negative for agitation, dysphoric mood and suicidal ideas. The patient is not nervous/anxious.     Per HPI unless specifically indicated above     Objective:    BP 124/80 (BP Location: Left Arm, Patient Position: Sitting, Cuff Size: Large)   Pulse 99   Ht  (1.6 m)   Wt 207 lb (93.9 kg)   SpO2 99%   BMI 36.67 kg/m   Wt Readings from Last 3 Encounters:  04/10/17 207 lb (93.9 kg)  02/11/17 170 lb (77.1 kg)  11/27/16 188 lb (85.3 kg)    Physical Exam  Constitutional: She is oriented to person, place, and time. She appears well-developed and well-nourished.  HENT:  Head: Normocephalic and atraumatic.  Neck: Neck supple.  Cardiovascular: Normal rate and regular rhythm.   Pulmonary/Chest: Effort normal and breath sounds  normal.  Abdominal: Soft. Bowel sounds are normal. She exhibits no mass. There is no hepatosplenomegaly. There is no tenderness.  Musculoskeletal: She exhibits no edema.  Lymphadenopathy:    She has no cervical adenopathy.  Neurological: She is alert and oriented to person, place, and time.  Skin: Skin is warm and dry.  Psychiatric: She has a normal mood and affect. Her  behavior is normal.  Vitals reviewed.       Assessment & Plan:    Encounter Diagnosis  Name Primary?  . Essential hypertension, benign Yes     -in light of concern for pregnancy, pt is to Stop amlodipine immediately. rx chlorthalidone given for BP. -pt counseled to Get to [facility] or [facility] for contraception/iud -pt to follow up here in one month to recheck the BP.  RTO sooner prn

## 2017-04-10 NOTE — Patient Instructions (Addendum)
Stop amlodipine Start chlorthalidone Check home pregnancy test Return to gyn for contraception

## 2017-04-30 ENCOUNTER — Other Ambulatory Visit (HOSPITAL_COMMUNITY)
Admission: RE | Admit: 2017-04-30 | Discharge: 2017-04-30 | Disposition: A | Discharge status: Home / Self Care | Payer: Self-pay | Source: Ambulatory Visit | Attending: Physician Assistant | Admitting: Physician Assistant

## 2017-04-30 ENCOUNTER — Encounter: Payer: Self-pay | Admitting: Physician Assistant

## 2017-04-30 ENCOUNTER — Ambulatory Visit: Payer: Self-pay | Admitting: Physician Assistant

## 2017-04-30 VITALS — BP 128/92 | HR 98 | Temp 98.1°F | Ht 63.0 in | Wt 206.0 lb

## 2017-04-30 DIAGNOSIS — H1032 Unspecified acute conjunctivitis, left eye: Secondary | ICD-10-CM

## 2017-04-30 DIAGNOSIS — N912 Amenorrhea, unspecified: Secondary | ICD-10-CM

## 2017-04-30 DIAGNOSIS — Z9119 Patient's noncompliance with other medical treatment and regimen: Secondary | ICD-10-CM

## 2017-04-30 DIAGNOSIS — I10 Essential (primary) hypertension: Secondary | ICD-10-CM

## 2017-04-30 DIAGNOSIS — Z91199 Patient's noncompliance with other medical treatment and regimen due to unspecified reason: Secondary | ICD-10-CM

## 2017-04-30 LAB — PREGNANCY, URINE: PREG TEST UR: NEGATIVE

## 2017-04-30 MED ORDER — CHLORTHALIDONE 25 MG PO TABS: 25.0000 mg | ORAL_TABLET | Freq: Every day | ORAL | 1 refills | Status: DC

## 2017-04-30 MED ORDER — CIPROFLOXACIN HCL 0.3 % OP SOLN: 1.0000 [drp] | OPHTHALMIC | 0 refills | Status: DC

## 2017-04-30 NOTE — Progress Notes (Signed)
BP (!) 128/92 (BP Location: Left Arm, Patient Position: Sitting)   Pulse 98   Temp 98.1 F (36.7 C)   Ht 5\' 3"  (1.6 m)   Wt 206 lb (93.4 kg)   SpO2 97%   BMI 36.49 kg/m    Subjective:    Patient ID: Stephanie Cook, female    DOB: 1986-04-02, 31 y.o.   MRN: [id number]  HPI: Stephanie Cook is a 31 y.o. female presenting on 04/30/2017 for Eye Problem (L eye for about 5 days. pt states she thinks she has pink eye)   HPI   Chief Complaint  Patient presents with  . Eye Problem    L eye for about 5 days. pt states she thinks she has pink eye   She does not wear corrective lenses.  + photophobia.  + discharge.  Visual acuity checked.  Pt has family planning medicaid.  She says she hasn't got appointment to get checked for pregnancy.  She has still not gotten her menstrual cycle since her last OV here.  She is still taking the amlodipine despite being told at previous OV to stop it due to risks of birth defects.  Relevant past medical, surgical, family and social history reviewed and updated as indicated. Interim medical history since our last visit reviewed. Allergies and medications reviewed and updated.  CURRENT MEDS: Amlodipine 10mg  qd  Review of Systems  Constitutional: Negative for appetite change, chills, diaphoresis, fatigue, fever and unexpected weight change.  HENT: Positive for sneezing and sore throat. Negative for congestion, dental problem, drooling, ear pain, facial swelling, hearing loss, mouth sores, trouble swallowing and voice change.   Eyes: Positive for pain, discharge, redness, itching and visual disturbance.  Respiratory: Positive for cough. Negative for choking, shortness of breath and wheezing.   Cardiovascular: Negative for chest pain, palpitations and leg swelling.  Gastrointestinal: Negative for abdominal pain, blood in stool, constipation, diarrhea and vomiting.  Endocrine: Negative for cold intolerance, heat intolerance and polydipsia.   Genitourinary: Negative for decreased urine volume, dysuria and hematuria.  Musculoskeletal: Negative for arthralgias, back pain and gait problem.  Skin: Negative for rash.  Allergic/Immunologic: Negative for environmental allergies.  Neurological: Negative for seizures, syncope, light-headedness and headaches.  Hematological: Negative for adenopathy.  Psychiatric/Behavioral: Negative for agitation, dysphoric mood and suicidal ideas. The patient is not nervous/anxious.     Per HPI unless specifically indicated above     Objective:    BP (!) 128/92 (BP Location: Left Arm, Patient Position: Sitting)   Pulse 98   Temp 98.1 F (36.7 C)   Ht 5\' 3"  (1.6 m)   Wt 206 lb (93.4 kg)   SpO2 97%   BMI 36.49 kg/m   Wt Readings from Last 3 Encounters:  04/30/17 206 lb (93.4 kg)  04/10/17 207 lb (93.9 kg)  02/11/17 170 lb (77.1 kg)    Physical Exam  Constitutional: She is oriented to person, place, and time. She appears well-developed and well-nourished.  HENT:  Head: Normocephalic and atraumatic.  Eyes: EOM are normal. Pupils are equal, round, and reactive to light. Right eye exhibits no discharge. Left eye exhibits discharge. Right conjunctiva is injected. Left conjunctiva is injected.  L eye with lots of whitish-greenish discharge. + tearing. L eyelid slightly puffy.  R eye injected, but less so than L eye.  R eye without tearing or discharge.   Neck: Neck supple.  Pulmonary/Chest: Effort normal.  Lymphadenopathy:    She has no cervical adenopathy.  Neurological: She  is alert and oriented to person, place, and time.  Skin: Skin is warm and dry.  Psychiatric: She has a normal mood and affect. Her behavior is normal.  Nursing note and vitals reviewed.        Assessment & Plan:   Encounter Diagnoses  Name Primary?  . Acute conjunctivitis of left eye, unspecified acute conjunctivitis type Yes  . Amenorrhea   . Essential hypertension, benign   . Personal history of noncompliance  with medical treatment, presenting hazards to health     -Pt counseled to keep hands out of eyes. Wash hands often. -rx ciloxan drops.  -pt instructed to discontinue the amlodipine until she gets on contraception.  Discussed again the risks for birth defects if she were to become pregnant while taking the amlodipine.  She is given another rx for the chlorthalidone -will check urine pregnancy since pt has family planning medicaid -will reschedule next week's appt (to check bp on chlorthalidone) to one month.

## 2017-05-08 ENCOUNTER — Ambulatory Visit: Payer: Self-pay | Admitting: Physician Assistant

## 2017-05-31 ENCOUNTER — Ambulatory Visit: Payer: Self-pay | Admitting: Physician Assistant

## 2017-06-06 ENCOUNTER — Encounter: Payer: Self-pay | Admitting: Physician Assistant

## 2017-08-14 ENCOUNTER — Other Ambulatory Visit: Payer: Self-pay | Admitting: Physician Assistant

## 2017-09-05 ENCOUNTER — Other Ambulatory Visit: Payer: Self-pay | Admitting: Physician Assistant

## 2017-09-28 ENCOUNTER — Emergency Department (HOSPITAL_COMMUNITY)
Admission: EM | Admit: 2017-09-28 | Discharge: 2017-09-28 | Disposition: A | Discharge status: Home / Self Care | Payer: Self-pay | Attending: Emergency Medicine | Admitting: Emergency Medicine

## 2017-09-28 ENCOUNTER — Other Ambulatory Visit: Payer: Self-pay | Admitting: Physician Assistant

## 2017-09-28 ENCOUNTER — Encounter (HOSPITAL_COMMUNITY): Payer: Self-pay | Admitting: Emergency Medicine

## 2017-09-28 DIAGNOSIS — F17210 Nicotine dependence, cigarettes, uncomplicated: Secondary | ICD-10-CM | POA: Insufficient documentation

## 2017-09-28 DIAGNOSIS — Z79899 Other long term (current) drug therapy: Secondary | ICD-10-CM | POA: Insufficient documentation

## 2017-09-28 DIAGNOSIS — J020 Streptococcal pharyngitis: Secondary | ICD-10-CM | POA: Insufficient documentation

## 2017-09-28 DIAGNOSIS — I10 Essential (primary) hypertension: Secondary | ICD-10-CM | POA: Insufficient documentation

## 2017-09-28 LAB — RAPID STREP SCREEN (MED CTR MEBANE ONLY): STREPTOCOCCUS, GROUP A SCREEN (DIRECT): POSITIVE — AB

## 2017-09-28 MED ORDER — PENICILLIN G BENZATHINE 1200000 UNIT/2ML IM SUSP
1.2000 10*6.[IU] | Freq: Once | INTRAMUSCULAR | Status: AC
  Administered 2017-09-28: 1.2 10*6.[IU] via INTRAMUSCULAR
  Filled 2017-09-28: qty 2

## 2017-09-28 NOTE — Discharge Instructions (Signed)
Drink plenty of fluids.  Tylenol every 4 hrs for fever and or pain.  Return here if needed.

## 2017-09-28 NOTE — ED Provider Notes (Signed)
[facility]-EMERGENCY DEPT Provider Note   CSN: [id number] Arrival date & time: 09/28/17  1415     History   Chief Complaint Chief Complaint  Patient presents with  . Sore Throat    HPI LANIQUA TORRENS is a 31 y.o. female.  HPI   HARTLEE AMEDEE is a 31 y.o. female who presents to the Emergency Department complaining of sore throat.  She describes a worsening pain with swallowing and frontal headache that has been persistent for 3 days. She also reports generalized body aches and just generally not feeling well. She states that she has been unable to eat solid food due to pain, but is drinking fluids.  She has not taken any medication for her symptoms.  Denies chest pain, vomiting, rash, shortness of breath and abdominal pain.   She denies known sick contacts.  Past Medical History:  Diagnosis Date  . Depression   . Frequent urination 09/28/2015  . HSV-2 (herpes simplex virus 2) infection   . Hx of chlamydia infection    multiple times  . Hx of gonorrhea   . Hx of trichomoniasis    recurrent, 4 times  . Hypertension   . Missed period 09/28/2015  . Pregnant   . Preterm labor   . STD (female)    chlamydia, trichomonas, gonorrhea, HPV, HSV    Patient Active Problem List   Diagnosis Date Noted  . Essential hypertension, benign 08/01/2016  . Obesity 08/01/2016  . Cigarette nicotine dependence without complication 08/01/2016  . Missed period 09/28/2015  . Frequent urination 09/28/2015  . Major depressive disorder, single episode, severe, specified as with psychotic behavior 03/12/2014  . 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 Mood disorder, drug-induced (HCC) 03/11/2014  . Depression 11/25/2013  . Anemia in pregnancy 09/02/2013  . Benign essential hypertension antepartum 06/11/2013  . H/O preterm delivery, currently pregnant 03/18/2013  . HSV-2 infection 03/18/2013    History reviewed. No pertinent surgical  history.  OB History    Gravida Para Term Preterm AB Living   SAB TAB Ectopic Multiple Live Births   1 0 0 0 4       Home Medications    Prior to Admission medications   Medication Sig Start Date End Date Taking? Authorizing Provider  amLODipine (NORVASC) 10 MG tablet Take 1 tablet (10 mg total) by mouth daily. 01/10/17   Jacquelin Hawking, PA-C  chlorthalidone (HYGROTON) 25 MG tablet Take 1 tablet (25 mg total) by mouth daily. 04/30/17   Jacquelin Hawking, PA-C  ciprofloxacin (CILOXAN) 0.3 % ophthalmic solution Place 1 drop into the left eye every 2 (two) hours. Administer 1 drop, every 2 hours, while awake, for 2 days. Then 1 drop, every 4 hours, while awake, for the next 5 days. 04/30/17   Jacquelin Hawking, PA-C  dicyclomine (BENTYL) 20 MG tablet Take 1 tablet (20 mg total) by mouth 3 (three) times daily as needed (abdominal pain/cramps). Patient not taking: Reported on 04/10/2017 02/11/17   Raeford Razor, MD  medroxyPROGESTERone (DEPO-PROVERA) 150 MG/ML injection Inject 1 mL (150 mg total) into the muscle every 3 (three) months. Patient not taking: Reported on 04/10/2017 05/02/16   Tilda Burrow, MD  pantoprazole (PROTONIX) 20 MG tablet Take 1 tablet (20 mg total) by mouth 2 (two) times daily before a meal. Patient not taking: Reported on 04/10/2017 02/11/17   Raeford Razor, MD    Family History Family History  Problem Relation  Age of Onset  . Lupus Mother   . Cirrhosis Mother   . Hypertension Maternal Grandmother     Social History Social History  Substance Use Topics  . Smoking status: Current Every Day Smoker    Packs/day: 0.50    Years: 8.00    Types: Cigarettes  . Smokeless tobacco: Never Used  . Alcohol use Yes     Comment: occasionally     Allergies   Motrin [ibuprofen]   Review of Systems Review of Systems  Constitutional: Negative for activity change, appetite change, chills and fever.  HENT: Positive for congestion, sore throat and trouble  swallowing. Negative for ear pain, facial swelling and voice change.   Eyes: Negative for pain and visual disturbance.  Respiratory: Negative for cough and shortness of breath.   Gastrointestinal: Negative for abdominal pain, nausea and vomiting.  Musculoskeletal: Negative for arthralgias, neck pain and neck stiffness.  Skin: Negative for color change and rash.  Neurological: Negative for dizziness, facial asymmetry, speech difficulty, numbness and headaches.  Hematological: Negative for adenopathy.  All other systems reviewed and are negative.    Physical Exam Updated Vital Signs BP (!) 146/111   Pulse 86   Temp 100.1 F (37.8 C)   Resp 18   Ht  (1.6 m)   Wt 79.4 kg (175 lb)   LMP 09/09/2017   SpO2 100%   BMI 31.00 kg/m   Physical Exam  Constitutional: She is oriented to person, place, and time. She appears well-developed and well-nourished. No distress.  HENT:  Head: Normocephalic and atraumatic.  Right Ear: Tympanic membrane and ear canal normal.  Left Ear: Tympanic membrane and ear canal normal.  Mouth/Throat: Uvula is midline and mucous membranes are normal. No trismus in the jaw. No uvula swelling. Oropharyngeal exudate, posterior oropharyngeal edema and posterior oropharyngeal erythema present. No tonsillar abscesses. Tonsillar exudate.  Neck: Normal range of motion. Neck supple.  Cardiovascular: Normal rate, regular rhythm and normal heart sounds.   No murmur heard. Pulmonary/Chest: Effort normal and breath sounds normal. No respiratory distress.  Abdominal: Normal appearance. She exhibits no distension. There is no splenomegaly. There is no tenderness. There is no guarding.  Musculoskeletal: Normal range of motion.  Lymphadenopathy:    She has cervical adenopathy.  Neurological: She is alert and oriented to person, place, and time. She exhibits normal muscle tone. Coordination normal.  Skin: Skin is warm and dry. Capillary refill takes less than 2 seconds.   Nursing note and vitals reviewed.    ED Treatments / Results  Labs (all labs ordered are listed, but only abnormal results are displayed) Labs Reviewed  RAPID STREP SCREEN (NOT AT [facility]) - Abnormal; Notable for the following:       Result Value   Streptococcus, Group A Screen (Direct) POSITIVE (*)    All other components within normal limits    EKG  EKG Interpretation None       Radiology No results found.  Procedures Procedures (including critical care time)  Medications Ordered in ED Medications  penicillin g benzathine (BICILLIN LA) 1200000 UNIT/2ML injection 1.2 Million Units (not administered)     Initial Impression / Assessment and Plan / ED Course  I have reviewed the triage vital signs and the nursing notes.  Pertinent labs & imaging results that were available during my care of the patient were reviewed by me and considered in my medical decision making (see chart for details).     Pt non-toxic appearing.  Airway patent  without PTA.   IM Bicillin here Pt agrees to tylenol at home, fluids.  Return precautions discussed.   Final Clinical Impressions(s) / ED Diagnoses   Final diagnoses:  Strep pharyngitis    New Prescriptions New Prescriptions   No medications on file     Rosey Bath 09/28/17 1704    Loren Racer, MD 10/02/17 [phone]

## 2017-09-28 NOTE — ED Triage Notes (Signed)
Pt c/o sore throat and h/a x 3 days 

## 2017-11-28 ENCOUNTER — Emergency Department (HOSPITAL_COMMUNITY)
Admission: EM | Admit: 2017-11-28 | Discharge: 2017-11-28 | Disposition: A | Discharge status: Home / Self Care | Payer: Self-pay | Attending: Emergency Medicine | Admitting: Emergency Medicine

## 2017-11-28 ENCOUNTER — Encounter (HOSPITAL_COMMUNITY): Payer: Self-pay | Admitting: *Deleted

## 2017-11-28 DIAGNOSIS — F17210 Nicotine dependence, cigarettes, uncomplicated: Secondary | ICD-10-CM | POA: Insufficient documentation

## 2017-11-28 DIAGNOSIS — L0501 Pilonidal cyst with abscess: Secondary | ICD-10-CM | POA: Insufficient documentation

## 2017-11-28 DIAGNOSIS — F329 Major depressive disorder, single episode, unspecified: Secondary | ICD-10-CM | POA: Insufficient documentation

## 2017-11-28 DIAGNOSIS — I10 Essential (primary) hypertension: Secondary | ICD-10-CM | POA: Insufficient documentation

## 2017-11-28 MED ORDER — HYDROCODONE-ACETAMINOPHEN 5-325 MG PO TABS: 1.0000 | ORAL_TABLET | ORAL | 0 refills | Status: DC | PRN

## 2017-11-28 MED ORDER — SULFAMETHOXAZOLE-TRIMETHOPRIM 800-160 MG PO TABS: 1.0000 | ORAL_TABLET | Freq: Two times a day (BID) | ORAL | 0 refills | Status: AC

## 2017-11-28 MED ORDER — POVIDONE-IODINE 10 % EX SOLN
CUTANEOUS | Status: AC
  Filled 2017-11-28: qty 15

## 2017-11-28 MED ORDER — OXYCODONE-ACETAMINOPHEN 5-325 MG PO TABS
1.0000 | ORAL_TABLET | Freq: Once | ORAL | Status: AC
  Administered 2017-11-28: 1 via ORAL
  Filled 2017-11-28: qty 1

## 2017-11-28 MED ORDER — LIDOCAINE-EPINEPHRINE (PF) 1 %-1:200000 IJ SOLN
INTRAMUSCULAR | Status: AC
  Filled 2017-11-28: qty 30

## 2017-11-28 MED ORDER — DOXYCYCLINE HYCLATE 100 MG PO TABS
100.0000 mg | ORAL_TABLET | Freq: Once | ORAL | Status: AC
  Administered 2017-11-28: 100 mg via ORAL
  Filled 2017-11-28: qty 1

## 2017-11-28 MED ORDER — PROMETHAZINE HCL 12.5 MG PO TABS
12.5000 mg | ORAL_TABLET | Freq: Once | ORAL | Status: AC
  Administered 2017-11-28: 12.5 mg via ORAL
  Filled 2017-11-28: qty 1

## 2017-11-28 NOTE — ED Notes (Signed)
Pt has abscess on the inner part of left buttocks. Has tried warm compresses on it without any progress. States she has also tried antibiotic cream.

## 2017-11-28 NOTE — ED Triage Notes (Signed)
Boil on buttocks. 

## 2017-11-28 NOTE — ED Notes (Signed)
Have sent pt home with an ABD pad

## 2017-11-28 NOTE — ED Provider Notes (Signed)
[facility] Provider Note   CSN: [id number] Arrival date & time: 11/28/17  1120     History   Chief Complaint Chief Complaint  Patient presents with  . Abscess    HPI Stephanie Cook is a 31 y.o. female.  Patient is a 31 year old female who presents to the emergency department with 4 days of noticing a abscess/boil of her buttocks.  The patient states the problem is getting progressively worse.  She cannot find a comfortable position to sit in.  She is not had any leaks or drainage from the abscess area.  She does not recall having this problem in the past.  Patient denies being diabetic.  She has not had fever or chills to be reported.  She presents now because she says she cannot get the pain to stop using conservative measures at home.      Past Medical History:  Diagnosis Date  . Depression   . Frequent urination 09/28/2015  . HSV-2 (herpes simplex virus 2) infection   . Hx of chlamydia infection    multiple times  . Hx of gonorrhea   . Hx of trichomoniasis    recurrent, 4 times  . Hypertension   . Missed period 09/28/2015  . Pregnant   . Preterm labor   . STD (female)    chlamydia, trichomonas, gonorrhea, HPV, HSV    Patient Active Problem List   Diagnosis Date Noted  . Essential hypertension, benign 08/01/2016  . Obesity 08/01/2016  . Cigarette nicotine dependence without complication 08/01/2016  . Missed period 09/28/2015  . Frequent urination 09/28/2015  . Major depressive disorder, single episode, severe, specified as with psychotic behavior 03/12/2014  . 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 Mood disorder, drug-induced (HCC) 03/11/2014  . Depression 11/25/2013  . Anemia in pregnancy 09/02/2013  . Benign essential hypertension antepartum 06/11/2013  . H/O preterm delivery, currently pregnant 03/18/2013  . HSV-2 infection 03/18/2013    History reviewed. No pertinent surgical  history.  OB History    Gravida Para Term Preterm AB Living   5 4 2 2 1 4    SAB TAB Ectopic Multiple Live Births   1 0 0 0 4       Home Medications    Prior to Admission medications   Medication Sig Start Date End Date Taking? Authorizing Provider  amLODipine (NORVASC) 10 MG tablet Take 1 tablet (10 mg total) by mouth daily. 01/10/17  Yes Jacquelin HawkingMcElroy, Shannon, PA-C  chlorthalidone (HYGROTON) 25 MG tablet Take 1 tablet (25 mg total) by mouth daily. 04/30/17  Yes Jacquelin HawkingMcElroy, Shannon, PA-C    Family History Family History  Problem Relation Age of Onset  . Lupus Mother   . Cirrhosis Mother   . Hypertension Maternal Grandmother     Social History Social History   Tobacco Use  . Smoking status: Current Every Day Smoker    Packs/day: 0.50    Years: 8.00    Pack years: 4.00    Types: Cigarettes  . Smokeless tobacco: Never Used  Substance Use Topics  . Alcohol use: Yes    Comment: occasionally  . Drug use: Yes    Types: Marijuana     Allergies   Motrin [ibuprofen]   Review of Systems Review of Systems  Constitutional: Negative for activity change.       All ROS Neg except as noted in HPI  HENT: Negative for nosebleeds.   Eyes: Negative for photophobia and discharge.  Respiratory: Negative for cough, shortness of breath and wheezing.   Cardiovascular: Negative for chest pain and palpitations.  Gastrointestinal: Negative for abdominal pain and blood in stool.  Genitourinary: Negative for dysuria, frequency and hematuria.  Musculoskeletal: Negative for arthralgias, back pain and neck pain.  Skin: Negative.        Abscess buttocks area  Neurological: Negative for dizziness, seizures and speech difficulty.  Psychiatric/Behavioral: Negative for confusion and hallucinations.     Physical Exam Updated Vital Signs BP (!) 140/101   Pulse 82   Temp 98.7 F (37.1 C) (Oral)   Resp 16   Ht 5\' 3"  (1.6 m)   Wt 79.4 kg (175 lb)   SpO2 100%   BMI 31.00 kg/m   Physical Exam   Constitutional: She is oriented to person, place, and time. She appears well-developed and well-nourished.  Non-toxic appearance.  HENT:  Head: Normocephalic.  Right Ear: Tympanic membrane and external ear normal.  Left Ear: Tympanic membrane and external ear normal.  Eyes: EOM and lids are normal. Pupils are equal, round, and reactive to light.  Neck: Normal range of motion. Neck supple. Carotid bruit is not present.  Cardiovascular: Normal rate, regular rhythm, normal heart sounds, intact distal pulses and normal pulses.  Pulmonary/Chest: Breath sounds normal. No respiratory distress.  Abdominal: Soft. Bowel sounds are normal. There is no tenderness. There is no guarding.  Genitourinary:    Pelvic exam was performed with patient prone.  Musculoskeletal: Normal range of motion.  Lymphadenopathy:       Head (right side): No submandibular adenopathy present.       Head (left side): No submandibular adenopathy present.    She has no cervical adenopathy.  Neurological: She is alert and oriented to person, place, and time. She has normal strength. No cranial nerve deficit or sensory deficit.  Skin: Skin is warm and dry.  Psychiatric: She has a normal mood and affect. Her speech is normal.  Nursing note and vitals reviewed.    ED Treatments / Results  Labs (all labs ordered are listed, but only abnormal results are displayed) Labs Reviewed - No data to display  EKG  EKG Interpretation None       Radiology No results found.  Procedures .Marland KitchenIncision and Drainage Date/Time: 11/28/2017 4:02 PM Performed by: Ivery Quale, PA-C Authorized by: Ivery Quale, PA-C   Consent:    Consent obtained:  Verbal   Consent given by:  Patient   Risks discussed:  Incomplete drainage, infection and pain   Alternatives discussed:  No treatment Location:    Type:  Abscess   Location:  Anogenital   Anogenital location:  Pilonidal Anesthesia (see MAR for exact dosages):    Anesthesia  method:  Local infiltration   Local anesthetic:  Lidocaine 1% WITH epi Procedure type:    Complexity:  Simple Procedure details:    Incision types:  Single straight   Incision depth:  Subcutaneous   Scalpel blade:  11   Wound management:  Probed and deloculated and irrigated with saline   Drainage:  Bloody and purulent   Drainage amount:  Copious   Wound treatment:  Wound left open Post-procedure details:    Patient tolerance of procedure:  Tolerated well, no immediate complications   (including critical care time)  Medications Ordered in ED Medications  lidocaine-EPINEPHrine (XYLOCAINE-EPINEPHrine) 1 %-1:200000 (PF) injection (not administered)  povidone-iodine (BETADINE) 10 % external solution (not administered)     Initial Impression / Assessment and Plan / ED Course  I have reviewed the triage vital signs and the nursing notes.  Pertinent labs & imaging results that were available during my care of the patient were reviewed by me and considered in my medical decision making (see chart for details).       Final Clinical Impressions(s) / ED Diagnoses MDM Patient presents to the emergency department with a complaint of boil on her buttocks.  She states she is noticed this over the last 4 days, and it is getting progressively worse.  Incision and drainage carried out and culture sent to the lab.  The patient will be started on Bactrim and warm tub soaks.  The patient will use Tylenol every 4 hours Norco for more severe pain.  The patient will return to the emergency department if any high fevers, or signs of advancing infection.  Patient is in agreement with this plan.   Final diagnoses:  Pilonidal abscess    ED Discharge Orders    None       Ivery QualeBryant, Uri Covey, PA-C 11/28/17 1624    Samuel JesterMcManus, Kathleen, DO 11/30/17 1736

## 2017-11-28 NOTE — Discharge Instructions (Signed)
Your pilonidal cyst/abscess has been drained.  A culture has been sent to the lab.  Use a warm Epson salt soak for about 15 minutes daily until this heals from the inside out.  Please use Bactrim DS 2 times daily with a meal.  Please use Tylenol every 4 hours for mild pain.  Use Norco for more severe pain.  Norco may cause drowsiness.  Please do not drive, drink alcohol, operate machinery, or participate in activities requiring concentration when taking this medication.  Please set up an appointment to see Dr. Lovell SheehanJenkins for surgical evaluation and management of your pilonidal abscess.

## 2017-12-03 LAB — AEROBIC CULTURE  (SUPERFICIAL SPECIMEN): SPECIAL REQUESTS: NORMAL

## 2017-12-03 LAB — AEROBIC CULTURE W GRAM STAIN (SUPERFICIAL SPECIMEN)

## 2018-01-05 ENCOUNTER — Other Ambulatory Visit: Payer: Self-pay

## 2018-01-05 ENCOUNTER — Emergency Department (HOSPITAL_COMMUNITY)
Admission: EM | Admit: 2018-01-05 | Discharge: 2018-01-05 | Disposition: A | Discharge status: Home / Self Care | Payer: Self-pay | Attending: Emergency Medicine | Admitting: Emergency Medicine

## 2018-01-05 ENCOUNTER — Encounter (HOSPITAL_COMMUNITY): Payer: Self-pay | Admitting: Adult Health

## 2018-01-05 DIAGNOSIS — K029 Dental caries, unspecified: Secondary | ICD-10-CM | POA: Insufficient documentation

## 2018-01-05 DIAGNOSIS — I10 Essential (primary) hypertension: Secondary | ICD-10-CM | POA: Insufficient documentation

## 2018-01-05 DIAGNOSIS — K0889 Other specified disorders of teeth and supporting structures: Secondary | ICD-10-CM

## 2018-01-05 DIAGNOSIS — F17210 Nicotine dependence, cigarettes, uncomplicated: Secondary | ICD-10-CM | POA: Insufficient documentation

## 2018-01-05 MED ORDER — HYDROCODONE-ACETAMINOPHEN 5-325 MG PO TABS
1.0000 | ORAL_TABLET | Freq: Once | ORAL | Status: AC
  Administered 2018-01-05: 1 via ORAL
  Filled 2018-01-05: qty 1

## 2018-01-05 MED ORDER — ACETAMINOPHEN 325 MG PO TABS
650.0000 mg | ORAL_TABLET | Freq: Four times a day (QID) | ORAL | Status: DC | PRN
  Filled 2018-01-05: qty 2

## 2018-01-05 MED ORDER — DEXAMETHASONE 4 MG PO TABS
10.0000 mg | ORAL_TABLET | Freq: Once | ORAL | Status: AC
  Administered 2018-01-05: 10 mg via ORAL
  Filled 2018-01-05: qty 3

## 2018-01-05 MED ORDER — PENICILLIN V POTASSIUM 250 MG PO TABS
500.0000 mg | ORAL_TABLET | Freq: Once | ORAL | Status: AC
  Administered 2018-01-05: 500 mg via ORAL
  Filled 2018-01-05: qty 2

## 2018-01-05 MED ORDER — ACETAMINOPHEN 325 MG PO TABS
650.0000 mg | ORAL_TABLET | Freq: Once | ORAL | Status: AC
Start: 2018-01-05 — End: 2018-01-05
  Administered 2018-01-05: 650 mg via ORAL
  Filled 2018-01-05: qty 2

## 2018-01-05 MED ORDER — PENICILLIN V POTASSIUM 500 MG PO TABS: 500.0000 mg | ORAL_TABLET | Freq: Four times a day (QID) | ORAL | 0 refills | Status: AC

## 2018-01-05 NOTE — ED Provider Notes (Signed)
Emergency Department Provider Note   I have reviewed the triage vital signs and the nursing notes.   HISTORY  Chief Complaint Dental Pain   HPI Stephanie Cook is a 32 y.o. female history of dental problems or presents the emergency department today with left upper posterior dental pain and swelling.  Patient states that this started last night and associated with some postnasal drip and sore throat.  States this is similar to previous dental infections.  Does not have a dentist.  Has not tried anything at home aside from [place].  This only relieved her symptoms for 20-30 minutes but then it came back.  No trouble breathing or other associated symptoms. No other associated or modifying symptoms.    Past Medical History:  Diagnosis Date  . Depression   . Frequent urination 09/28/2015  . HSV-2 (herpes simplex virus 2) infection   . Hx of chlamydia infection    multiple times  . Hx of gonorrhea   . Hx of trichomoniasis    recurrent, 4 times  . Hypertension   . Missed period 09/28/2015  . Pregnant   . Preterm labor   . STD (female)    chlamydia, trichomonas, gonorrhea, HPV, HSV    Patient Active Problem List   Diagnosis Date Noted  . Essential hypertension, benign 08/01/2016  . Obesity 08/01/2016  . Cigarette nicotine dependence without complication 08/01/2016  . Missed period 09/28/2015  . Frequent urination 09/28/2015  . Major depressive disorder, single episode, severe, specified as with psychotic behavior 03/12/2014  . 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 Mood disorder, drug-induced (HCC) 03/11/2014  . Depression 11/25/2013  . Anemia in pregnancy 09/02/2013  . Benign essential hypertension antepartum 06/11/2013  . H/O preterm delivery, currently pregnant 03/18/2013  . HSV-2 infection 03/18/2013    History reviewed. No pertinent surgical history.  Current Outpatient Rx  . Order #: [id number] Class: Normal  .  Order #: [id number] Class: Print    Allergies Motrin [ibuprofen]  Family History  Problem Relation Age of Onset  . Lupus Mother   . Cirrhosis Mother   . Hypertension Maternal Grandmother     Social History Social History   Tobacco Use  . Smoking status: Current Every Day Smoker    Packs/day: 0.50    Years: 8.00    Pack years: 4.00    Types: Cigarettes  . Smokeless tobacco: Never Used  Substance Use Topics  . Alcohol use: Yes    Comment: occasionally  . Drug use: Yes    Types: Marijuana    Review of Systems  All other systems negative except as documented in the HPI. All pertinent positives and negatives as reviewed in the HPI. ____________________________________________   PHYSICAL EXAM:  VITAL SIGNS: ED Triage Vitals  Enc Vitals Group     BP 01/05/18 1103 (!) 141/104     Pulse Rate 01/05/18 1103 84     Resp 01/05/18 1103 18     Temp 01/05/18 1103 98.4 F (36.9 C)     Temp Source 01/05/18 1103 Oral     SpO2 01/05/18 1103 97 %    Constitutional: Alert and oriented. Well appearing and in no acute distress. Eyes: Conjunctivae are normal. PERRL. EOMI. Head: Atraumatic. Nose: No congestion/rhinnorhea. Mouth/Throat: Mucous membranes are moist.  Oropharynx non-erythematous. Left upper first molar with a couple areas of small amount of decay. Mild swelling around same tooth without fluctuance, induration.  Neck: No stridor.  No meningeal signs.  Cardiovascular: Normal rate, regular rhythm. Good peripheral circulation. Grossly normal heart sounds.   Respiratory: Normal respiratory effort.  No retractions. Lungs CTAB. Gastrointestinal: Soft and nontender. No distention.  Musculoskeletal: No lower extremity tenderness nor edema. No gross deformities of extremities. Neurologic:  Normal speech and language. No gross focal neurologic deficits are appreciated.  Skin:  Skin is warm, dry and intact. No rash  noted.  ________________________________________________   INITIAL IMPRESSION / ASSESSMENT AND PLAN / ED COURSE  Suspect simple dental infection without any evidence of complications at this point.  Will treat with antibiotics and Tylenol.  Will follow up with dental.   Pertinent labs & imaging results that were available during my care of the patient were reviewed by me and considered in my medical decision making (see chart for details).  ____________________________________________  FINAL CLINICAL IMPRESSION(S) / ED DIAGNOSES  Final diagnoses:  Pain, dental  [place]     MEDICATIONS GIVEN DURING THIS VISIT:  Medications  HYDROcodone-acetaminophen (NORCO/VICODIN) 5-325 MG per tablet 1 tablet (not administered)  acetaminophen (TYLENOL) tablet 650 mg (not administered)  penicillin v potassium (VEETID) tablet 500 mg (not administered)  acetaminophen (TYLENOL) tablet 650 mg (not administered)  dexamethasone (DECADRON) tablet 10 mg (not administered)     NEW OUTPATIENT MEDICATIONS STARTED DURING THIS VISIT:  New Prescriptions   PENICILLIN V POTASSIUM (VEETID) 500 MG TABLET    Take 1 tablet (500 mg total) by mouth 4 (four) times daily for 7 days.    Note:  This note was prepared with assistance of Dragon voice recognition software. Occasional wrong-word or sound-a-like substitutions may have occurred due to the inherent limitations of voice recognition software.   Marily MemosMesner, Ahlana Slaydon, MD 01/05/18 1241

## 2018-01-05 NOTE — ED Triage Notes (Signed)
PREsents with left upper back dental pain began last night became worse this AM denies fevers.

## 2018-04-03 ENCOUNTER — Encounter (HOSPITAL_COMMUNITY): Payer: Self-pay | Admitting: *Deleted

## 2018-04-03 ENCOUNTER — Other Ambulatory Visit: Payer: Self-pay

## 2018-04-03 ENCOUNTER — Emergency Department (HOSPITAL_COMMUNITY)
Admission: EM | Admit: 2018-04-03 | Discharge: 2018-04-03 | Disposition: A | Discharge status: Home / Self Care | Payer: Medicaid Other | Attending: Emergency Medicine | Admitting: Emergency Medicine

## 2018-04-03 DIAGNOSIS — M65052 Abscess of tendon sheath, left thigh: Secondary | ICD-10-CM | POA: Diagnosis not present

## 2018-04-03 DIAGNOSIS — Z5321 Procedure and treatment not carried out due to patient leaving prior to being seen by health care provider: Secondary | ICD-10-CM | POA: Diagnosis not present

## 2018-04-03 NOTE — ED Triage Notes (Signed)
Pt c/o abscess to left inner thigh x 2 days; pt denies any drainage

## 2018-04-05 ENCOUNTER — Encounter (HOSPITAL_COMMUNITY): Payer: Self-pay | Admitting: Emergency Medicine

## 2018-04-05 ENCOUNTER — Other Ambulatory Visit: Payer: Self-pay

## 2018-04-05 ENCOUNTER — Emergency Department (HOSPITAL_COMMUNITY)
Admission: EM | Admit: 2018-04-05 | Discharge: 2018-04-05 | Disposition: A | Discharge status: Home / Self Care | Payer: Medicaid Other | Attending: Emergency Medicine | Admitting: Emergency Medicine

## 2018-04-05 DIAGNOSIS — L02416 Cutaneous abscess of left lower limb: Secondary | ICD-10-CM | POA: Diagnosis not present

## 2018-04-05 DIAGNOSIS — Z79899 Other long term (current) drug therapy: Secondary | ICD-10-CM | POA: Insufficient documentation

## 2018-04-05 DIAGNOSIS — L0231 Cutaneous abscess of buttock: Secondary | ICD-10-CM | POA: Diagnosis not present

## 2018-04-05 DIAGNOSIS — L0291 Cutaneous abscess, unspecified: Secondary | ICD-10-CM

## 2018-04-05 DIAGNOSIS — I10 Essential (primary) hypertension: Secondary | ICD-10-CM | POA: Diagnosis not present

## 2018-04-05 DIAGNOSIS — F17210 Nicotine dependence, cigarettes, uncomplicated: Secondary | ICD-10-CM | POA: Insufficient documentation

## 2018-04-05 MED ORDER — DOXYCYCLINE HYCLATE 100 MG PO CAPS: 100.0000 mg | ORAL_CAPSULE | Freq: Two times a day (BID) | ORAL | 0 refills | Status: DC

## 2018-04-05 MED ORDER — LIDOCAINE-EPINEPHRINE (PF) 2 %-1:200000 IJ SOLN
INTRAMUSCULAR | Status: AC
  Administered 2018-04-05: 20 mL
  Filled 2018-04-05: qty 20

## 2018-04-05 NOTE — ED Notes (Signed)
Pt reports abscess left upper inner thigh for 4 days and abscess in right gluteal fold x 1 day

## 2018-04-05 NOTE — ED Provider Notes (Signed)
[facility] EMERGENCY DEPARTMENT Provider Note   CSN: [id number] Arrival date & time: 04/05/18  [id number]     History   Chief Complaint Chief Complaint  Patient presents with  . Abscess    HPI Stephanie McgregorBrittany D Tengan is a 32 y.o. female ending for evaluation of abscess on the left inner thigh and right buttock.  Patient states that the abscess on her left inner thigh has been present for the past 4 days.  It has been gradually increasing in pain and size.  No drainage.  She has not taken anything for it including Tylenol.  Yesterday she developed a similar painful lesion on her right buttock.  This is mildly tender. Pain is worse with palpation, nothing makes it better. She denies fevers, chills, nausea, vomiting.  She is not immunocompromised, is not on blood thinners.  Tetanus is up-to-date.   HPI  Past Medical History:  Diagnosis Date  . Depression   . Frequent urination 09/28/2015  . HSV-2 (herpes simplex virus 2) infection   . Hx of chlamydia infection    multiple times  . Hx of gonorrhea   . Hx of trichomoniasis    recurrent, 4 times  . Hypertension   . Missed period 09/28/2015  . Pregnant   . Preterm labor   . STD (female)    chlamydia, trichomonas, gonorrhea, HPV, HSV    Patient Active Problem List   Diagnosis Date Noted  . Essential hypertension, benign 08/01/2016  . Obesity 08/01/2016  . Cigarette nicotine dependence without complication 08/01/2016  . Missed period 09/28/2015  . Frequent urination 09/28/2015  . Major depressive disorder, single episode, severe, specified as with psychotic behavior 03/12/2014  . 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 Mood disorder, drug-induced (HCC) 03/11/2014  . Depression 11/25/2013  . Anemia in pregnancy 09/02/2013  . Benign essential hypertension antepartum 06/11/2013  . H/O preterm delivery, currently pregnant 03/18/2013  . HSV-2 infection 03/18/2013    History reviewed. No  pertinent surgical history.   OB History    Gravida  5   Para  4   Term  2   Preterm  2   AB  1   Living  4     SAB  1   TAB  0   Ectopic  0   Multiple  0   Live Births  4            Home Medications    Prior to Admission medications   Medication Sig Start Date End Date Taking? Authorizing Provider  amLODipine (NORVASC) 10 MG tablet Take 1 tablet (10 mg total) by mouth daily. 01/10/17   Jacquelin HawkingMcElroy, Shannon, PA-C  doxycycline (VIBRAMYCIN) 100 MG capsule Take 1 capsule (100 mg total) by mouth 2 (two) times daily. 04/05/18   Amery Vandenbos, PA-C    Family History Family History  Problem Relation Age of Onset  . Lupus Mother   . Cirrhosis Mother   . Hypertension Maternal Grandmother     Social History Social History   Tobacco Use  . Smoking status: Current Every Day Smoker    Packs/day: 0.50    Years: 8.00    Pack years: 4.00    Types: Cigarettes  . Smokeless tobacco: Never Used  Substance Use Topics  . Alcohol use: Yes    Comment: occasionally  . Drug use: Yes    Types: Marijuana     Allergies   Motrin [ibuprofen]   Review of Systems Review of  Systems  Skin:       Lesion on L inner thigh and R buttock  Allergic/Immunologic: Negative for immunocompromised state.  Hematological: Does not bruise/bleed easily.     Physical Exam Updated Vital Signs BP 116/81 (BP Location: Right Arm)   Pulse 90   Temp 98.2 F (36.8 C) (Oral)   Resp 18   Ht 5\' 2"  (1.575 m)   Wt 81.6 kg (180 lb)   LMP 03/06/2018   SpO2 100%   BMI 32.92 kg/m   Physical Exam  Constitutional: She is oriented to person, place, and time. She appears well-developed and well-nourished. No distress.  HENT:  Head: Normocephalic and atraumatic.  Eyes: EOM are normal.  Neck: Normal range of motion.  Pulmonary/Chest: Effort normal.  Abdominal: She exhibits no distension.  Musculoskeletal: Normal range of motion.  Neurological: She is alert and oriented to person, place, and  time.  Skin: Skin is warm. No rash noted.  Fluctuant lesion of the left upper medial thigh without active drainage.  Tender to palpation.  No streaking or LAD.  Small, mildly tender lesion of the right buttock without involvement of the sphincter.  No surrounding erythema or signs of cellulitis.  Psychiatric: She has a normal mood and affect.  Nursing note and vitals reviewed.    ED Treatments / Results  Labs (all labs ordered are listed, but only abnormal results are displayed) Labs Reviewed - No data to display  EKG None  Radiology No results found.  EMERGENCY DEPARTMENT US SOFT TISSUE INTERPRETATION "Study: Limited Soft Tissue Ultrasound"  INDICATIONS: Pain and Soft tissue infection Multiple views of the body part were obtained in real-time with a multi-frequency linear probe  PERFORMED BY: Myself IMAGES ARCHIVED?: Yes SIDE:Right  BODY PART:Lower extremity and R buttock INTERPRETATION:  Abcess present     Procedures .Marland KitchenIncision and Drainage Date/Time: 04/05/2018 11:10 AM Performed by: Alveria Apley, PA-C Authorized by: Alveria Apley, PA-C   Consent:    Consent obtained:  Verbal   Consent given by:  Patient   Risks discussed:  Bleeding, incomplete drainage, pain and infection Location:    Type:  Abscess   Location:  Lower extremity   Lower extremity location:  Leg   Leg location:  L upper leg Pre-procedure details:    Skin preparation:  Chloraprep Anesthesia (see MAR for exact dosages):    Anesthesia method:  Local infiltration   Local anesthetic:  Lidocaine 2% WITH epi Procedure type:    Complexity:  Simple Procedure details:    Needle aspiration: no     Incision types:  Single straight   Incision depth:  Dermal   Scalpel blade:  11   Wound management:  Probed and deloculated and irrigated with saline   Drainage:  Purulent   Drainage amount:  Moderate   Wound treatment:  Wound left open   Packing materials:  None Post-procedure details:     Patient tolerance of procedure:  Tolerated well, no immediate complications   (including critical care time)  Medications Ordered in ED Medications  lidocaine-EPINEPHrine (XYLOCAINE W/EPI) 2 %-1:200000 (PF) injection (has no administration in time range)     Initial Impression / Assessment and Plan / ED Course  I have reviewed the triage vital signs and the nursing notes.  Pertinent labs & imaging results that were available during my care of the patient were reviewed by me and considered in my medical decision making (see chart for details).     Patient presenting for evaluation of abscess  x2.  Thigh abscess large and tender.  I&D performed and purulent material expressed.  Aftercare instructions given.  Abscess on right buttock mildly tender and small.  Discussed option of I&D versus antibiotics and warm compresses and watchful waiting.  Patient elects to not have I&D performed today.  Ultrasound images are archived, shows fluid collection.  Will discharge on doxycycline.  At this time, patient appears safe for discharge.  Return precautions given.  Patient states she understands and agrees to plan.  Final Clinical Impressions(s) / ED Diagnoses   Final diagnoses:  Abscess    ED Discharge Orders        Ordered    doxycycline (VIBRAMYCIN) 100 MG capsule  2 times daily     04/05/18 1117       Cresencio Reesor, PA-C 04/05/18 1118    Bethann Berkshire, MD 04/09/18 [phone]

## 2018-04-05 NOTE — Discharge Instructions (Signed)
Take antibiotics as prescribed.  Take the entire course, even if your symptoms improve. Take tylenol as needed for pain. Use a warm compress on the abscess on your butt at least 3 times a day. Keep the dressing on for the next 24 hours.  After this, you may remove dressing, wash gently with soap and water, and reapply new dressing. Return to the emergency room if you develop fevers, vomiting, worsening pain, bleeding, or infection.

## 2018-04-05 NOTE — ED Triage Notes (Signed)
PT c/o abscess to left inner thigh x4 days with no drainage or fevers.

## 2018-07-07 ENCOUNTER — Emergency Department (HOSPITAL_COMMUNITY): Payer: Medicaid Other

## 2018-07-07 ENCOUNTER — Emergency Department (HOSPITAL_COMMUNITY)
Admission: EM | Admit: 2018-07-07 | Discharge: 2018-07-07 | Disposition: A | Discharge status: Home / Self Care | Payer: Medicaid Other | Attending: Emergency Medicine | Admitting: Emergency Medicine

## 2018-07-07 ENCOUNTER — Other Ambulatory Visit: Payer: Self-pay

## 2018-07-07 ENCOUNTER — Encounter (HOSPITAL_COMMUNITY): Payer: Self-pay | Admitting: Emergency Medicine

## 2018-07-07 DIAGNOSIS — R1084 Generalized abdominal pain: Secondary | ICD-10-CM | POA: Insufficient documentation

## 2018-07-07 DIAGNOSIS — O9989 Other specified diseases and conditions complicating pregnancy, childbirth and the puerperium: Secondary | ICD-10-CM | POA: Diagnosis present

## 2018-07-07 DIAGNOSIS — O219 Vomiting of pregnancy, unspecified: Secondary | ICD-10-CM

## 2018-07-07 DIAGNOSIS — F17210 Nicotine dependence, cigarettes, uncomplicated: Secondary | ICD-10-CM | POA: Insufficient documentation

## 2018-07-07 DIAGNOSIS — Z79899 Other long term (current) drug therapy: Secondary | ICD-10-CM | POA: Insufficient documentation

## 2018-07-07 DIAGNOSIS — Z3A01 Less than 8 weeks gestation of pregnancy: Secondary | ICD-10-CM | POA: Insufficient documentation

## 2018-07-07 DIAGNOSIS — O10011 Pre-existing essential hypertension complicating pregnancy, first trimester: Secondary | ICD-10-CM | POA: Diagnosis not present

## 2018-07-07 DIAGNOSIS — R109 Unspecified abdominal pain: Secondary | ICD-10-CM

## 2018-07-07 DIAGNOSIS — O26891 Other specified pregnancy related conditions, first trimester: Secondary | ICD-10-CM

## 2018-07-07 DIAGNOSIS — O99331 Smoking (tobacco) complicating pregnancy, first trimester: Secondary | ICD-10-CM | POA: Diagnosis not present

## 2018-07-07 LAB — CBC
HCT: 36.2 % (ref 36.0–46.0)
HEMOGLOBIN: 12.1 g/dL (ref 12.0–15.0)
MCH: 28.9 pg (ref 26.0–34.0)
MCHC: 33.4 g/dL (ref 30.0–36.0)
MCV: 86.6 fL (ref 78.0–100.0)
PLATELETS: 231 10*3/uL (ref 150–400)
RBC: 4.18 MIL/uL (ref 3.87–5.11)
RDW: 12.9 % (ref 11.5–15.5)
WBC: 12.4 10*3/uL — AB (ref 4.0–10.5)

## 2018-07-07 LAB — URINALYSIS, ROUTINE W REFLEX MICROSCOPIC
Bilirubin Urine: NEGATIVE
Glucose, UA: NEGATIVE mg/dL
Hgb urine dipstick: NEGATIVE
Ketones, ur: NEGATIVE mg/dL
Leukocytes, UA: NEGATIVE
Nitrite: NEGATIVE
Protein, ur: NEGATIVE mg/dL
Specific Gravity, Urine: 1.026 (ref 1.005–1.030)
pH: 5 (ref 5.0–8.0)

## 2018-07-07 LAB — COMPREHENSIVE METABOLIC PANEL
ALT: 14 U/L (ref 0–44)
AST: 17 U/L (ref 15–41)
Albumin: 3.8 g/dL (ref 3.5–5.0)
Alkaline Phosphatase: 54 U/L (ref 38–126)
Anion gap: 7 (ref 5–15)
BUN: 7 mg/dL (ref 6–20)
CO2: 23 mmol/L (ref 22–32)
Calcium: 9 mg/dL (ref 8.9–10.3)
Chloride: 108 mmol/L (ref 98–111)
Creatinine, Ser: 0.74 mg/dL (ref 0.44–1.00)
GFR calc Af Amer: >60 mL/min (ref >60)
GFR calc non Af Amer: >60 mL/min (ref >60)
Glucose, Bld: 126 mg/dL — ABNORMAL HIGH (ref 70–99)
Potassium: 3.4 mmol/L — ABNORMAL LOW (ref 3.5–5.1)
Sodium: 138 mmol/L (ref 135–145)
Total Bilirubin: 0.5 mg/dL (ref 0.3–1.2)
Total Protein: 7.6 g/dL (ref 6.5–8.1)

## 2018-07-07 LAB — LIPASE, BLOOD: Lipase: 36 U/L (ref 11–51)

## 2018-07-07 LAB — I-STAT BETA HCG BLOOD, ED (MC, WL, AP ONLY)

## 2018-07-07 MED ORDER — DOXYLAMINE-PYRIDOXINE 10-10 MG PO TBEC: DELAYED_RELEASE_TABLET | ORAL | 1 refills | Status: DC

## 2018-07-07 MED ORDER — VITAMIN B-6 100 MG PO TABS
100.0000 mg | ORAL_TABLET | Freq: Every day | ORAL | Status: DC
  Administered 2018-07-07: 100 mg via ORAL
  Filled 2018-07-07 (×2): qty 1

## 2018-07-07 NOTE — ED Notes (Signed)
Pharmacy contacted to B6 tablet.

## 2018-07-07 NOTE — ED Triage Notes (Signed)
Patient c/o abd pain that started in upper abd but has now radiated into lower abd x1 week. Patient c/o nausea. Denies any vomiting, diarrhea, fevers, or urinary symptoms. Per patient possible pregnancy-June 16th was last period.

## 2018-07-07 NOTE — Discharge Instructions (Signed)
As discussed, I will results show that you are early in pregnancy. We are unfortunately unable to tell conclusively how far along you are, and if this pregnancy is viable. You will need an ultrasound and repeat assessment in 2 weeks, please see a OB doctor as soon as possible. Start taking over-the-counter prenatal vitamins.  Return to the ER immediately if you start having worsening abdominal pain or back pain, fevers, vaginal bleeding.

## 2018-07-08 ENCOUNTER — Other Ambulatory Visit: Payer: Self-pay | Admitting: *Deleted

## 2018-07-08 ENCOUNTER — Telehealth: Payer: Self-pay | Admitting: *Deleted

## 2018-07-08 DIAGNOSIS — O3680X0 Pregnancy with inconclusive fetal viability, not applicable or unspecified: Secondary | ICD-10-CM

## 2018-07-08 NOTE — Telephone Encounter (Signed)
PT went to Er yesterday and [facility] Please advise

## 2018-07-08 NOTE — Telephone Encounter (Signed)
Dating U/S scheduled at [facility] for 8/5 @ 10:30, pt to arrive at 10:15 with full bladder. Pt verbalized understanding with no questions.

## 2018-07-08 NOTE — ED Provider Notes (Signed)
[facility] Provider Note   CSN: [id number] Arrival date & time: 07/07/18  1230     History   Chief Complaint Chief Complaint  Patient presents with  . Abdominal Pain    HPI Stephanie Cook is a 32 y.o. female.  HPI 32 year old female comes in with chief complaint of abdominal discomfort, breast pain, nausea. Patient reports that her symptoms have been going on for the past few days.  She has 4 babies, and she thinks that she might be pregnant.  Patient denies any burning with urination, blood in the urine, vaginal discharge, vaginal bleeding.  Her abdominal discomfort is generalized and fairly constant, but moderate in nature.  LMP was 1 month ago.  Past Medical History:  Diagnosis Date  . Depression   . Frequent urination 09/28/2015  . HSV-2 (herpes simplex virus 2) infection   . Hx of chlamydia infection    multiple times  . Hx of gonorrhea   . Hx of trichomoniasis    recurrent, 4 times  . Hypertension   . Missed period 09/28/2015  . Pregnant   . Preterm labor   . STD (female)    chlamydia, trichomonas, gonorrhea, HPV, HSV    Patient Active Problem List   Diagnosis Date Noted  . Essential hypertension, benign 08/01/2016  . Obesity 08/01/2016  . Cigarette nicotine dependence without complication 08/01/2016  . Missed period 09/28/2015  . Frequent urination 09/28/2015  . Major depressive disorder, single episode, severe, specified as with psychotic behavior 03/12/2014  . 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 Mood disorder, drug-induced (HCC) 03/11/2014  . Depression 11/25/2013  . Anemia in pregnancy 09/02/2013  . Benign essential hypertension antepartum 06/11/2013  . H/O preterm delivery, currently pregnant 03/18/2013  . HSV-2 infection 03/18/2013    History reviewed. No pertinent surgical history.   OB History    Gravida  5   Para  4   Term  2   Preterm  2   AB  1   Living  4     SAB  1   TAB  0   Ectopic  0   Multiple  0   Live Births  4            Home Medications    Prior to Admission medications   Medication Sig Start Date End Date Taking? Authorizing Provider  amLODipine (NORVASC) 10 MG tablet Take 1 tablet (10 mg total) by mouth daily. 01/10/17  Yes Jacquelin HawkingMcElroy, Shannon, PA-C  doxycycline (VIBRAMYCIN) 100 MG capsule Take 1 capsule (100 mg total) by mouth 2 (two) times daily. Patient not taking: Reported on 07/07/2018 04/05/18   Caccavale, Sophia, PA-C  Doxylamine-Pyridoxine 10-10 MG TBEC Two tablets at bedtime on day 1 and 2; if symptoms persist, take 1 tablet in morning and 2 tablets at bedtime on day 3; if symptoms persist, may increase to 1 tablet in morning, 1 tablet mid-afternoon, and 2 tablets at bedtime on day 4 MAXIMUM DOSE 40 MG/DAY. 07/07/18   Derwood KaplanNanavati, Timothee Gali, MD    Family History Family History  Problem Relation Age of Onset  . Lupus Mother   . Cirrhosis Mother   . Hypertension Maternal Grandmother     Social History Social History   Tobacco Use  . Smoking status: Current Every Day Smoker    Packs/day: 0.50    Years: 8.00    Pack years: 4.00    Types: Cigarettes  . Smokeless tobacco: Never Used  Substance Use Topics  . Alcohol use: Yes    Comment: occasionally  . Drug use: Yes    Types: Marijuana     Allergies   Motrin [ibuprofen]   Review of Systems Review of Systems  Constitutional: Positive for activity change.  Gastrointestinal: Positive for abdominal pain and nausea. Negative for diarrhea.  Genitourinary: Negative for dysuria, flank pain, vaginal bleeding and vaginal discharge.  Neurological: Negative for dizziness.     Physical Exam Updated Vital Signs BP 119/83   Pulse 79   Temp 98.9 F (37.2 C) (Oral)   Resp 16   Ht 5\' 2"  (1.575 m)   Wt 85.3 kg (188 lb)   LMP 06/02/2018   SpO2 100%   BMI 34.39 kg/m   Physical Exam  Constitutional: She is oriented to person, place, and time. She  appears well-developed.  HENT:  Head: Normocephalic and atraumatic.  Eyes: EOM are normal.  Neck: Normal range of motion. Neck supple.  Cardiovascular: Normal rate.  Pulmonary/Chest: Effort normal.  Abdominal: Bowel sounds are normal. There is generalized tenderness. There is no rebound and no guarding.  Neurological: She is alert and oriented to person, place, and time.  Skin: Skin is warm and dry.  Nursing note and vitals reviewed.    ED Treatments / Results  Labs (all labs ordered are listed, but only abnormal results are displayed) Labs Reviewed  COMPREHENSIVE METABOLIC PANEL - Abnormal; Notable for the following components:      Result Value   Potassium 3.4 (*)    Glucose, Bld 126 (*)    All other components within normal limits  CBC - Abnormal; Notable for the following components:   WBC 12.4 (*)    All other components within normal limits  URINALYSIS, ROUTINE W REFLEX MICROSCOPIC - Abnormal; Notable for the following components:   APPearance HAZY (*)    All other components within normal limits  I-STAT BETA HCG BLOOD, ED ([facility], [facility], [facility] ONLY) - Abnormal; Notable for the following components:   I-stat hCG, quantitative >2,000.0 (*)    All other components within normal limits  LIPASE, BLOOD    EKG None  Radiology US Ob Comp Less 14 Wks  Result Date: 07/07/2018 CLINICAL DATA:  Pelvic pain for several days with positive pregnancy test EXAM: OBSTETRIC <14 WK [place] AND TRANSVAGINAL OB US TECHNIQUE: Both transabdominal and transvaginal ultrasound examinations were performed for complete evaluation of the gestation as well as the maternal uterus, adnexal regions, and pelvic cul-de-sac. Transvaginal technique was performed to assess early pregnancy. COMPARISON:  None. FINDINGS: Intrauterine gestational sac: Visualized Yolk sac:  Not visualized Embryo:  Not visualized Cardiac Activity: Not visualized MSD: 7 mm 5 w 3 d Subchorionic hemorrhage:  None visualized. Maternal  uterus/adnexae: Cervical os is closed. Left ovary measures 3.3 x 2.3 x 2.7 cm. Right ovary measures 4.4 x 2.7 x 2.4 cm. No extrauterine pelvic or adnexal mass. No free pelvic fluid. IMPRESSION: Small apparent gestational sac within the uterus measuring 7 mm. Probable early intrauterine gestational sac, but no yolk sac, fetal pole, or cardiac activity yet visualized. Recommend follow-up quantitative B-HCG levels and follow-up US in 14 days to assess viability. This recommendation follows SRU consensus guidelines: Diagnostic Criteria for Nonviable Pregnancy Early in the First Trimester. Malva Limes Med 2013; 161:0960-45. Based on apparent gestational sac size, estimated gestational age measured at 5+ weeks. Study otherwise unremarkable. Electronically Signed   By: Bretta Bang III M.D.   On: 07/07/2018 15:24   US  Ob Transvaginal  Result Date: 07/07/2018 CLINICAL DATA:  Pelvic pain for several days with positive pregnancy test EXAM: OBSTETRIC <14 WK [place] AND TRANSVAGINAL OB US TECHNIQUE: Both transabdominal and transvaginal ultrasound examinations were performed for complete evaluation of the gestation as well as the maternal uterus, adnexal regions, and pelvic cul-de-sac. Transvaginal technique was performed to assess early pregnancy. COMPARISON:  None. FINDINGS: Intrauterine gestational sac: Visualized Yolk sac:  Not visualized Embryo:  Not visualized Cardiac Activity: Not visualized MSD: 7 mm 5 w 3 d Subchorionic hemorrhage:  None visualized. Maternal uterus/adnexae: Cervical os is closed. Left ovary measures 3.3 x 2.3 x 2.7 cm. Right ovary measures 4.4 x 2.7 x 2.4 cm. No extrauterine pelvic or adnexal mass. No free pelvic fluid. IMPRESSION: Small apparent gestational sac within the uterus measuring 7 mm. Probable early intrauterine gestational sac, but no yolk sac, fetal pole, or cardiac activity yet visualized. Recommend follow-up quantitative B-HCG levels and follow-up US in 14 days to assess viability. This  recommendation follows SRU consensus guidelines: Diagnostic Criteria for Nonviable Pregnancy Early in the First Trimester. Malva Limes Med 2013; 161:0960-45. Based on apparent gestational sac size, estimated gestational age measured at 5+ weeks. Study otherwise unremarkable. Electronically Signed   By: Bretta Bang III M.D.   On: 07/07/2018 15:24    Procedures Procedures (including critical care time)  Medications Ordered in ED Medications - No data to display   Initial Impression / Assessment and Plan / ED Course  I have reviewed the triage vital signs and the nursing notes.  Pertinent labs & imaging results that were available during my care of the patient were reviewed by me and considered in my medical decision making (see chart for details).     31 year old female comes in with chief complaint of abdominal discomfort, nausea.  She also has breast tenderness.  It appears that patient's LMP was in June, and that she has missed her July period.  On exam patient does not have any peritoneal findings.  She has no UTI-like symptoms and no vaginal discharge or bleeding.  Beta-hCG is elevated, ultrasound ordered to rule out any ectopic.  Ultrasound is equivocal as far as viability of pregnancy is concerned, however there is no clear evidence of ectopic pregnancy.  Based on our clinical exam, I do not think patient has an ectopic pregnancy.  We have discussed the results of the ER work-up with the patient.  She will return to the ER if she starts having excruciating pain, bleeding, dizziness, sweats otherwise she will follow-up with her OB doctor. The nausea, which truly appears to be the main complaint is likely because of her first trimester pregnancy.  We gave patient B6, and she feels a lot better.  P.o. challenge has been passed. Final Clinical Impressions(s) / ED Diagnoses   Final diagnoses:  Abdominal pain during pregnancy, first trimester  Nausea and vomiting during pregnancy     ED Discharge Orders        Ordered    Doxylamine-Pyridoxine 10-10 MG TBEC     07/07/18 1658       Derwood Kaplan, MD 07/08/18 0038

## 2018-07-18 ENCOUNTER — Telehealth: Payer: Self-pay | Admitting: Obstetrics & Gynecology

## 2018-07-18 NOTE — Telephone Encounter (Signed)
Pt informed to continue norvasc.

## 2018-07-18 NOTE — Telephone Encounter (Signed)
norvasc is perfect

## 2018-07-18 NOTE — Telephone Encounter (Signed)
Patient called stating that she is pregnant and that she has high blood pressure. Pt states that she has stopped the blood pressure medication that she was taking before she got pregnant, pt states that she doesn't know if this is safe for the baby. Pt would like to know what she should do. Pt has an US appointment at the hospital on 8/14. Please contact pt

## 2018-07-22 ENCOUNTER — Other Ambulatory Visit: Payer: Self-pay | Admitting: Adult Health

## 2018-07-22 ENCOUNTER — Ambulatory Visit (HOSPITAL_COMMUNITY)
Admission: RE | Admit: 2018-07-22 | Discharge: 2018-07-22 | Disposition: A | Discharge status: Home / Self Care | Payer: Medicaid Other | Source: Ambulatory Visit | Attending: Adult Health | Admitting: Adult Health

## 2018-07-22 DIAGNOSIS — O3680X0 Pregnancy with inconclusive fetal viability, not applicable or unspecified: Secondary | ICD-10-CM | POA: Diagnosis not present

## 2018-07-22 DIAGNOSIS — Z3A01 Less than 8 weeks gestation of pregnancy: Secondary | ICD-10-CM | POA: Diagnosis not present

## 2018-08-08 ENCOUNTER — Ambulatory Visit (INDEPENDENT_AMBULATORY_CARE_PROVIDER_SITE_OTHER): Payer: Medicaid Other | Admitting: Advanced Practice Midwife

## 2018-08-08 ENCOUNTER — Ambulatory Visit: Payer: Medicaid Other | Admitting: *Deleted

## 2018-08-08 ENCOUNTER — Encounter: Payer: Self-pay | Admitting: Advanced Practice Midwife

## 2018-08-08 VITALS — BP 117/82 | HR 80 | Wt 201.0 lb

## 2018-08-08 DIAGNOSIS — Z1389 Encounter for screening for other disorder: Secondary | ICD-10-CM

## 2018-08-08 DIAGNOSIS — B009 Herpesviral infection, unspecified: Secondary | ICD-10-CM

## 2018-08-08 DIAGNOSIS — F329 Major depressive disorder, single episode, unspecified: Secondary | ICD-10-CM

## 2018-08-08 DIAGNOSIS — O98511 Other viral diseases complicating pregnancy, first trimester: Secondary | ICD-10-CM

## 2018-08-08 DIAGNOSIS — Z3A09 9 weeks gestation of pregnancy: Secondary | ICD-10-CM

## 2018-08-08 DIAGNOSIS — O99341 Other mental disorders complicating pregnancy, first trimester: Secondary | ICD-10-CM

## 2018-08-08 DIAGNOSIS — F32A Depression, unspecified: Secondary | ICD-10-CM

## 2018-08-08 DIAGNOSIS — O0990 Supervision of high risk pregnancy, unspecified, unspecified trimester: Secondary | ICD-10-CM

## 2018-08-08 DIAGNOSIS — Z331 Pregnant state, incidental: Secondary | ICD-10-CM

## 2018-08-08 DIAGNOSIS — O10011 Pre-existing essential hypertension complicating pregnancy, first trimester: Secondary | ICD-10-CM

## 2018-08-08 DIAGNOSIS — O10019 Pre-existing essential hypertension complicating pregnancy, unspecified trimester: Secondary | ICD-10-CM

## 2018-08-08 MED ORDER — METHYLDOPA 250 MG PO TABS: 250.0000 mg | ORAL_TABLET | Freq: Two times a day (BID) | ORAL | 6 refills | Status: DC

## 2018-08-08 NOTE — Patient Instructions (Signed)
 First Trimester of Pregnancy The first trimester of pregnancy is from week 1 until the end of week 12 (months 1 through 3). A week after a sperm fertilizes an egg, the egg will implant on the wall of the uterus. This embryo will begin to develop into a baby. Genes from you and your partner are forming the baby. The female genes determine whether the baby is a boy or a girl. At 6-8 weeks, the eyes and face are formed, and the heartbeat can be seen on ultrasound. At the end of 12 weeks, all the baby's organs are formed.  Now that you are pregnant, you will want to do everything you can to have a healthy baby. Two of the most important things are to get good prenatal care and to follow your health care provider's instructions. Prenatal care is all the medical care you receive before the baby's birth. This care will help prevent, find, and treat any problems during the pregnancy and childbirth. BODY CHANGES Your body goes through many changes during pregnancy. The changes vary from woman to woman.   You may gain or lose a couple of pounds at first.  You may feel sick to your stomach (nauseous) and throw up (vomit). If the vomiting is uncontrollable, call your health care provider.  You may tire easily.  You may develop headaches that can be relieved by medicines approved by your health care provider.  You may urinate more often. Painful urination may mean you have a bladder infection.  You may develop heartburn as a result of your pregnancy.  You may develop constipation because certain hormones are causing the muscles that push waste through your intestines to slow down.  You may develop hemorrhoids or swollen, bulging veins (varicose veins).  Your breasts may begin to grow larger and become tender. Your nipples may stick out more, and the tissue that surrounds them (areola) may become darker.  Your gums may bleed and may be sensitive to brushing and flossing.  Dark spots or blotches  (chloasma, mask of pregnancy) may develop on your face. This will likely fade after the baby is born.  Your menstrual periods will stop.  You may have a loss of appetite.  You may develop cravings for certain kinds of food.  You may have changes in your emotions from day to day, such as being excited to be pregnant or being concerned that something may go wrong with the pregnancy and baby.  You may have more vivid and strange dreams.  You may have changes in your hair. These can include thickening of your hair, rapid growth, and changes in texture. Some women also have hair loss during or after pregnancy, or hair that feels dry or thin. Your hair will most likely return to normal after your baby is born. WHAT TO EXPECT AT YOUR PRENATAL VISITS During a routine prenatal visit:  You will be weighed to make sure you and the baby are growing normally.  Your blood pressure will be taken.  Your abdomen will be measured to track your baby's growth.  The fetal heartbeat will be listened to starting around week 10 or 12 of your pregnancy.  Test results from any previous visits will be discussed. Your health care provider may ask you:  How you are feeling.  If you are feeling the baby move.  If you have had any abnormal symptoms, such as leaking fluid, bleeding, severe headaches, or abdominal cramping.  If you have any questions. Other   tests that may be performed during your first trimester include:  Blood tests to find your blood type and to check for the presence of any previous infections. They will also be used to check for low iron levels (anemia) and Rh antibodies. Later in the pregnancy, blood tests for diabetes will be done along with other tests if problems develop.  Urine tests to check for infections, diabetes, or protein in the urine.  An ultrasound to confirm the proper growth and development of the baby.  An amniocentesis to check for possible genetic problems.  Fetal  screens for spina bifida and Down syndrome.  You may need other tests to make sure you and the baby are doing well. HOME CARE INSTRUCTIONS  Medicines  Follow your health care provider's instructions regarding medicine use. Specific medicines may be either safe or unsafe to take during pregnancy.  Take your prenatal vitamins as directed.  If you develop constipation, try taking a stool softener if your health care provider approves. Diet  Eat regular, well-balanced meals. Choose a variety of foods, such as meat or vegetable-based protein, fish, milk and low-fat dairy products, vegetables, fruits, and whole grain breads and cereals. Your health care provider will help you determine the amount of weight gain that is right for you.  Avoid raw meat and uncooked cheese. These carry germs that can cause birth defects in the baby.  Eating four or five small meals rather than three large meals a day may help relieve nausea and vomiting. If you start to feel nauseous, eating a few soda crackers can be helpful. Drinking liquids between meals instead of during meals also seems to help nausea and vomiting.  If you develop constipation, eat more high-fiber foods, such as fresh vegetables or fruit and whole grains. Drink enough fluids to keep your urine clear or pale yellow. Activity and Exercise  Exercise only as directed by your health care provider. Exercising will help you:  Control your weight.  Stay in shape.  Be prepared for labor and delivery.  Experiencing pain or cramping in the lower abdomen or low back is a good sign that you should stop exercising. Check with your health care provider before continuing normal exercises.  Try to avoid standing for long periods of time. Move your legs often if you must stand in one place for a long time.  Avoid heavy lifting.  Wear low-heeled shoes, and practice good posture.  You may continue to have sex unless your health care provider directs you  otherwise. Relief of Pain or Discomfort  Wear a good support bra for breast tenderness.   Take warm sitz baths to soothe any pain or discomfort caused by hemorrhoids. Use hemorrhoid cream if your health care provider approves.   Rest with your legs elevated if you have leg cramps or low back pain.  If you develop varicose veins in your legs, wear support hose. Elevate your feet for 15 minutes, 3-4 times a day. Limit salt in your diet. Prenatal Care  Schedule your prenatal visits by the twelfth week of pregnancy. They are usually scheduled monthly at first, then more often in the last 2 months before delivery.  Write down your questions. Take them to your prenatal visits.  Keep all your prenatal visits as directed by your health care provider. Safety  Wear your seat belt at all times when driving.  Make a list of emergency phone numbers, including numbers for family, friends, the hospital, and police and fire departments. General   Tips  Ask your health care provider for a referral to a local prenatal education class. Begin classes no later than at the beginning of month 6 of your pregnancy.  Ask for help if you have counseling or nutritional needs during pregnancy. Your health care provider can offer advice or refer you to specialists for help with various needs.  Do not use hot tubs, steam rooms, or saunas.  Do not douche or use tampons or scented sanitary pads.  Do not cross your legs for long periods of time.  Avoid cat litter boxes and soil used by cats. These carry germs that can cause birth defects in the baby and possibly loss of the fetus by miscarriage or stillbirth.  Avoid all smoking, herbs, alcohol, and medicines not prescribed by your health care provider. Chemicals in these affect the formation and growth of the baby.  Schedule a dentist appointment. At home, brush your teeth with a soft toothbrush and be gentle when you floss. SEEK MEDICAL CARE IF:   You have  dizziness.  You have mild pelvic cramps, pelvic pressure, or nagging pain in the abdominal area.  You have persistent nausea, vomiting, or diarrhea.  You have a bad smelling vaginal discharge.  You have pain with urination.  You notice increased swelling in your face, hands, legs, or ankles. SEEK IMMEDIATE MEDICAL CARE IF:   You have a fever.  You are leaking fluid from your vagina.  You have spotting or bleeding from your vagina.  You have severe abdominal cramping or pain.  You have rapid weight gain or loss.  You vomit blood or material that looks like coffee grounds.  You are exposed to German measles and have never had them.  You are exposed to fifth disease or chickenpox.  You develop a severe headache.  You have shortness of breath.  You have any kind of trauma, such as from a fall or a car accident. Document Released: 11/28/2001 Document Revised: 04/20/2014 Document Reviewed: 10/14/2013 ExitCare Patient Information 2015 [employer]. This information is not intended to replace advice given to you by your health care provider. Make sure you discuss any questions you have with your health care provider.   Nausea & Vomiting  Have saltine crackers or pretzels by your bed and eat a few bites before you raise your head out of bed in the morning  Eat small frequent meals throughout the day instead of large meals  Drink plenty of fluids throughout the day to stay hydrated, just don't drink a lot of fluids with your meals.  This can make your stomach fill up faster making you feel sick  Do not brush your teeth right after you eat  Products with real ginger are good for nausea, like ginger ale and ginger hard candy Make sure it says made with real ginger!  Sucking on sour candy like lemon heads is also good for nausea  If your prenatal vitamins make you nauseated, take them at night so you will sleep through the nausea  Sea Bands  If you feel like you need  medicine for the nausea & vomiting please let us know  If you are unable to keep any fluids or food down please let us know   Constipation  Drink plenty of fluid, preferably water, throughout the day  Eat foods high in fiber such as fruits, vegetables, and grains  Exercise, such as walking, is a good way to keep your bowels regular  Drink warm fluids, especially warm   prune juice, or decaf coffee  Eat a 1/2 cup of real oatmeal (not instant), 1/2 cup applesauce, and 1/2-1 cup warm prune juice every day  If needed, you may take Colace (docusate sodium) stool softener once or twice a day to help keep the stool soft. If you are pregnant, wait until you are out of your first trimester (12-14 weeks of pregnancy)  If you still are having problems with constipation, you may take Miralax once daily as needed to help keep your bowels regular.  If you are pregnant, wait until you are out of your first trimester (12-14 weeks of pregnancy)  Safe Medications in Pregnancy   Acne: Benzoyl Peroxide Salicylic Acid  Backache/Headache: Tylenol: 2 regular strength every 4 hours OR              2 Extra strength every 6 hours  Colds/Coughs/Allergies: Benadryl (alcohol free) 25 mg every 6 hours as needed Breath right strips Claritin Cepacol throat lozenges Chloraseptic throat spray Cold-Eeze- up to three times per day Cough drops, alcohol free Flonase (by prescription only) Guaifenesin Mucinex Robitussin DM (plain only, alcohol free) Saline nasal spray/drops Sudafed (pseudoephedrine) & Actifed ** use only after [redacted] weeks gestation and if you do not have high blood pressure Tylenol Vicks Vaporub Zinc lozenges Zyrtec   Constipation: Colace Ducolax suppositories Fleet enema Glycerin suppositories Metamucil Milk of magnesia Miralax Senokot Smooth move tea  Diarrhea: Kaopectate Imodium A-D  *NO pepto Bismol  Hemorrhoids: Anusol Anusol HC Preparation  H Tucks  Indigestion: Tums Maalox Mylanta Zantac  Pepcid  Insomnia: Benadryl (alcohol free) 25mg every 6 hours as needed Tylenol PM Unisom, no Gelcaps  Leg Cramps: Tums MagGel  Nausea/Vomiting:  Bonine Dramamine Emetrol Ginger extract Sea bands Meclizine  Nausea medication to take during pregnancy:  Unisom (doxylamine succinate 25 mg tablets) Take one tablet daily at bedtime. If symptoms are not adequately controlled, the dose can be increased to a maximum recommended dose of two tablets daily (1/2 tablet in the morning, 1/2 tablet mid-afternoon and one at bedtime). Vitamin B6 100mg tablets. Take one tablet twice a day (up to 200 mg per day).  Skin Rashes: Aveeno products Benadryl cream or 25mg every 6 hours as needed Calamine Lotion 1% cortisone cream  Yeast infection: Gyne-lotrimin 7 Monistat 7   **If taking multiple medications, please check labels to avoid duplicating the same active ingredients **take medication as directed on the label ** Do not exceed 4000 mg of tylenol in 24 hours **Do not take medications that contain aspirin or ibuprofen      

## 2018-08-08 NOTE — Progress Notes (Signed)
INITIAL OBSTETRICAL VISIT Patient name: Stephanie McgregorBrittany D Palomarez MRN [id number]  Date of birth: June 14, 1986 Chief Complaint:   Initial Prenatal Visit  History of Present Illness:   Stephanie Cook is a 32 y.o. [id number] African American female at 3280w4d by LMP/6 week US with an Estimated Date of Delivery: 03/09/19 being seen today for her initial obstetrical visit.   Her obstetrical history is significant for PTD at 33 weeks w/3rd child and a 36.3 SVD last child (used 17p). 37 week SVDs 1st two children  Today she reports no complaints.  Patient's last menstrual period was 06/02/2018. Last pap 5/17. Results were: normal Review of Systems:   Pertinent items are noted in HPI Denies cramping/contractions, leakage of fluid, vaginal bleeding, abnormal vaginal discharge w/ itching/odor/irritation, headaches, visual changes, shortness of breath, chest pain, abdominal pain, severe nausea/vomiting, or problems with urination or bowel movements unless otherwise stated above.  Pertinent History Reviewed:  Reviewed past medical,surgical, social, obstetrical and family history.  Reviewed problem list, medications and allergies. 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 Depression.  4 SAB 12/2012 7262w0d            Birth Comments: System Generated. Please review and update pregnancy details.  3 Preterm 05/04/12 1982w0d  4 lb 14 oz (2.211 kg) F Vag-Spont None Y LIV     Birth Comments: System Generated. Please review and update pregnancy details.  2 Term 07/25/09 40362w0d 03:30 6 lb 7 oz  (2.92 kg) F Vag-Spont EPI N LIV  1 Term 02/01/08 51362w0d 11:00 6 lb 11 oz (3.033 kg) M Vag-Spont EPI N LIV   Physical Assessment:   Vitals:   08/08/18 1400  BP: 117/82  Pulse: 80  Weight: 201 lb (91.2 kg)  Body mass index is 36.76 kg/m.       Physical Examination:  General appearance - well appearing, and in no distress  Mental status - alert, oriented to person, place, and time  Psych:  She has a normal mood and affect  Skin - warm and dry, normal color, no suspicious lesions noted  Chest - effort normal, all lung fields clear to auscultation bilaterally  Heart - normal rate and regular rhythm  Abdomen - soft, nontender  Extremities:  No swelling or varicosities noted    No results found for this or any previous visit (from the past 24 hour(s)).  Assessment & Plan:  1) High-Risk Pregnancy [id number] at 1980w4d with an Estimated Date of Delivery: 03/09/19   2) Initial OB visit  3) CHTN:  Pt was on aldomet last pg, prefers to switch to that  Meds:  Meds ordered this encounter  Medications  . methyldopa (ALDOMET) 250 MG tablet    Sig: Take 1 tablet (250 mg total) by mouth 2 (two) times daily.    Dispense:  60 tablet    Refill:  6    Order Specific Question:   Supervising Provider  Answer:   Lazaro Arms [[id number]]    Initial labs obtained Continue prenatal vitamins Reviewed n/v relief measures and warning s/s to report Reviewed recommended weight gain based on pre-gravid BMI Encouraged well-balanced diet Genetic Screening discussed First Screen, Integrated Screen and Quad Screen: declined Cystic fibrosis screening discussed declined Ultrasound discussed; fetal survey: requested CCNC completed  Follow-up: Return in about 1 week (around 08/15/2018) for BP check and 4 weeks for HROB.   Orders Placed This Encounter  Procedures  . Urine Culture  . Obstetric Panel, Including HIV  . Urinalysis, Routine w reflex microscopic  . Pain Management Screening Profile (10S)  . Protein  / creatinine ratio, urine  . POC Urinalysis Dipstick OB    Jacklyn Shell CNM,  08/08/2018 8:00 PM

## 2018-08-09 ENCOUNTER — Telehealth (INDEPENDENT_AMBULATORY_CARE_PROVIDER_SITE_OTHER): Payer: Self-pay | Admitting: Obstetrics & Gynecology

## 2018-08-09 LAB — URINALYSIS, ROUTINE W REFLEX MICROSCOPIC
BILIRUBIN UA: NEGATIVE
GLUCOSE, UA: NEGATIVE
KETONES UA: NEGATIVE
Leukocytes, UA: NEGATIVE
Nitrite, UA: NEGATIVE
PH UA: 7 (ref 5.0–7.5)
PROTEIN UA: NEGATIVE
RBC UA: NEGATIVE
SPEC GRAV UA: 1.021 (ref 1.005–1.030)
UUROB: 1 mg/dL (ref 0.2–1.0)

## 2018-08-09 LAB — OBSTETRIC PANEL, INCLUDING HIV
Antibody Screen: NEGATIVE
BASOS ABS: 0 10*3/uL (ref 0.0–0.2)
Basos: 0 %
EOS (ABSOLUTE): 0.3 10*3/uL (ref 0.0–0.4)
Eos: 2 %
HEP B S AG: NEGATIVE
HIV Screen 4th Generation wRfx: NONREACTIVE
Hematocrit: 35.2 % (ref 34.0–46.6)
Hemoglobin: 11.4 g/dL (ref 11.1–15.9)
IMMATURE GRANULOCYTES: 0 %
Immature Grans (Abs): 0 10*3/uL (ref 0.0–0.1)
Lymphocytes Absolute: 3.7 10*3/uL — ABNORMAL HIGH (ref 0.7–3.1)
Lymphs: 24 %
MCH: 27.9 pg (ref 26.6–33.0)
MCHC: 32.4 g/dL (ref 31.5–35.7)
MCV: 86 fL (ref 79–97)
MONOCYTES: 8 %
Monocytes Absolute: 1.2 10*3/uL — ABNORMAL HIGH (ref 0.1–0.9)
NEUTROS PCT: 66 %
Neutrophils Absolute: 10.3 10*3/uL — ABNORMAL HIGH (ref 1.4–7.0)
PLATELETS: 283 10*3/uL (ref 150–450)
RBC: 4.08 x10E6/uL (ref 3.77–5.28)
RDW: 13.6 % (ref 12.3–15.4)
RPR: NONREACTIVE
Rh Factor: POSITIVE
Rubella Antibodies, IGG: 1.41 index (ref >0.99)
WBC: 15.5 10*3/uL — AB (ref 3.4–10.8)

## 2018-08-09 LAB — PMP SCREEN PROFILE (10S), URINE
AMPHETAMINE SCREEN URINE: NEGATIVE ng/mL
BARBITURATE SCREEN URINE: NEGATIVE ng/mL
BENZODIAZEPINE SCREEN, URINE: NEGATIVE ng/mL
CANNABINOIDS UR QL SCN: NEGATIVE ng/mL
COCAINE(METAB.)SCREEN, URINE: NEGATIVE ng/mL
Creatinine(Crt), U: 137.8 mg/dL (ref 20.0–300.0)
Methadone Screen, Urine: NEGATIVE ng/mL
OPIATE SCREEN URINE: NEGATIVE ng/mL
OXYCODONE+OXYMORPHONE UR QL SCN: NEGATIVE ng/mL
PH UR, DRUG SCRN: 7.1 (ref 4.5–8.9)
PHENCYCLIDINE QUANTITATIVE URINE: NEGATIVE ng/mL
Propoxyphene Scrn, Ur: NEGATIVE ng/mL

## 2018-08-09 NOTE — Telephone Encounter (Signed)
Patient stated that her blood pressure [facility] [place] and they weren't sure when they'll get it in.  The patient is requesting that it be sent to [facility].  [phone]

## 2018-08-09 NOTE — Telephone Encounter (Signed)
Verbal order called to [facility] for Methyldopa.  Patient informed they only have 500mg  tablets available so they will cut for her.  She will take a half a tablet 2 times daily.  Pt verbalized understanding.

## 2018-08-10 LAB — URINE CULTURE

## 2018-08-14 ENCOUNTER — Ambulatory Visit (INDEPENDENT_AMBULATORY_CARE_PROVIDER_SITE_OTHER): Payer: Medicaid Other

## 2018-08-14 VITALS — BP 130/84 | HR 85 | Ht 62.0 in | Wt 200.0 lb

## 2018-08-14 DIAGNOSIS — Z0130 Encounter for examination of blood pressure without abnormal findings: Secondary | ICD-10-CM

## 2018-08-14 DIAGNOSIS — Z331 Pregnant state, incidental: Secondary | ICD-10-CM

## 2018-08-14 DIAGNOSIS — Z1389 Encounter for screening for other disorder: Secondary | ICD-10-CM

## 2018-08-14 LAB — POCT URINALYSIS DIPSTICK OB
Glucose, UA: NEGATIVE
Ketones, UA: NEGATIVE
LEUKOCYTES UA: NEGATIVE
NITRITE UA: NEGATIVE
RBC UA: NEGATIVE

## 2018-08-14 NOTE — Progress Notes (Signed)
Pt here for blood pressure check 130/84 Pulse 85. Waited and recheck B/P 116/80 Pulse 86. Spoke with Starwood Hot[employer]Fran Dishmon. Blood pressure looking good. Pt has appointment 4 weeks for OB visit.Pad CMA.

## 2018-08-21 ENCOUNTER — Telehealth: Payer: Self-pay | Admitting: Adult Health

## 2018-08-21 NOTE — Telephone Encounter (Signed)
Patient states she has developed a headache about 2 days ago since taking the bp medication. Started taking medication on 8/27.Informed patient that pregnancy hormones can cause headaches and she was to early to have pre-e.  Patient asked if she should be taken ASA and spoke with KRB who stated patient should be taking 162mg  ASA daily.  Pt informed and verbalized understanding.

## 2018-09-02 ENCOUNTER — Encounter (HOSPITAL_COMMUNITY): Payer: Self-pay | Admitting: Emergency Medicine

## 2018-09-02 ENCOUNTER — Other Ambulatory Visit: Payer: Self-pay

## 2018-09-02 ENCOUNTER — Emergency Department (HOSPITAL_COMMUNITY)
Admission: EM | Admit: 2018-09-02 | Discharge: 2018-09-02 | Disposition: A | Discharge status: Home / Self Care | Payer: Medicaid Other | Attending: Emergency Medicine | Admitting: Emergency Medicine

## 2018-09-02 DIAGNOSIS — R1030 Lower abdominal pain, unspecified: Secondary | ICD-10-CM | POA: Insufficient documentation

## 2018-09-02 DIAGNOSIS — O9989 Other specified diseases and conditions complicating pregnancy, childbirth and the puerperium: Secondary | ICD-10-CM | POA: Insufficient documentation

## 2018-09-02 DIAGNOSIS — Z3A13 13 weeks gestation of pregnancy: Secondary | ICD-10-CM | POA: Diagnosis not present

## 2018-09-02 DIAGNOSIS — Z79899 Other long term (current) drug therapy: Secondary | ICD-10-CM | POA: Diagnosis not present

## 2018-09-02 DIAGNOSIS — R109 Unspecified abdominal pain: Secondary | ICD-10-CM

## 2018-09-02 DIAGNOSIS — Z87891 Personal history of nicotine dependence: Secondary | ICD-10-CM | POA: Insufficient documentation

## 2018-09-02 DIAGNOSIS — O10912 Unspecified pre-existing hypertension complicating pregnancy, second trimester: Secondary | ICD-10-CM | POA: Insufficient documentation

## 2018-09-02 LAB — COMPREHENSIVE METABOLIC PANEL
ALBUMIN: 3.3 g/dL — AB (ref 3.5–5.0)
ALT: 21 U/L (ref 0–44)
AST: 19 U/L (ref 15–41)
Alkaline Phosphatase: 44 U/L (ref 38–126)
Anion gap: 9 (ref 5–15)
BUN: 7 mg/dL (ref 6–20)
CHLORIDE: 106 mmol/L (ref 98–111)
CO2: 22 mmol/L (ref 22–32)
Calcium: 8.9 mg/dL (ref 8.9–10.3)
Creatinine, Ser: 0.5 mg/dL (ref 0.44–1.00)
GFR calc Af Amer: >60 mL/min (ref >60)
GFR calc non Af Amer: >60 mL/min (ref >60)
GLUCOSE: 86 mg/dL (ref 70–99)
POTASSIUM: 3.9 mmol/L (ref 3.5–5.1)
SODIUM: 137 mmol/L (ref 135–145)
Total Bilirubin: 0.6 mg/dL (ref 0.3–1.2)
Total Protein: 7.1 g/dL (ref 6.5–8.1)

## 2018-09-02 LAB — URINALYSIS, ROUTINE W REFLEX MICROSCOPIC
Bilirubin Urine: NEGATIVE
Glucose, UA: NEGATIVE mg/dL
Hgb urine dipstick: NEGATIVE
KETONES UR: 80 mg/dL — AB
Nitrite: NEGATIVE
PROTEIN: NEGATIVE mg/dL
Specific Gravity, Urine: 1.026 (ref 1.005–1.030)
pH: 6 (ref 5.0–8.0)

## 2018-09-02 LAB — CBC WITH DIFFERENTIAL/PLATELET
BASOS PCT: 0 %
Basophils Absolute: 0 10*3/uL (ref 0.0–0.1)
EOS ABS: 0.4 10*3/uL (ref 0.0–0.7)
Eosinophils Relative: 4 %
HEMATOCRIT: 33.6 % — AB (ref 36.0–46.0)
Hemoglobin: 11.3 g/dL — ABNORMAL LOW (ref 12.0–15.0)
Lymphocytes Relative: 26 %
Lymphs Abs: 3.2 10*3/uL (ref 0.7–4.0)
MCH: 29.3 pg (ref 26.0–34.0)
MCHC: 33.6 g/dL (ref 30.0–36.0)
MCV: 87 fL (ref 78.0–100.0)
MONO ABS: 0.6 10*3/uL (ref 0.1–1.0)
Monocytes Relative: 5 %
NEUTROS ABS: 7.8 10*3/uL — AB (ref 1.7–7.7)
Neutrophils Relative %: 65 %
Platelets: 235 10*3/uL (ref 150–400)
RBC: 3.86 MIL/uL — ABNORMAL LOW (ref 3.87–5.11)
RDW: 13.7 % (ref 11.5–15.5)
WBC: 12 10*3/uL — ABNORMAL HIGH (ref 4.0–10.5)

## 2018-09-02 LAB — LIPASE, BLOOD: LIPASE: 30 U/L (ref 11–51)

## 2018-09-02 MED ORDER — SODIUM CHLORIDE 0.9 % IV BOLUS
1000.0000 mL | Freq: Once | INTRAVENOUS | Status: AC
  Administered 2018-09-02: 1000 mL via INTRAVENOUS

## 2018-09-02 NOTE — Discharge Instructions (Signed)
Your labs today are normal as is your exam.  You are a little early for this to be round ligament pain which is a normal part of pregnancy but this is a possible reason for your symptoms.  Follow up with your obstetrician on Thursday as planned.

## 2018-09-02 NOTE — ED Triage Notes (Signed)
Patient complains of abdominal pain x 2 days. States [redacted] weeks pregnant. Denies n/v/d. NAD

## 2018-09-02 NOTE — ED Provider Notes (Signed)
[facility] EMERGENCY DEPARTMENT Provider Note   CSN: [id number] Arrival date & time: 09/02/18  1044     History   Chief Complaint Chief Complaint  Patient presents with  . Abdominal Pain    HPI Stephanie Cook is a 32 y.o. female currently [redacted] weeks pregnant with her 5th child (G6, A1) establish with family tree, presenting with right mid flank pain since yesterday evening. She describes a sharp, stabbing pain in her right side which is triggered by sitting or from a lying position or standing from a sitting position and lasting for approximately 30 seconds before resolving.  She denies n/v, fevers, chills, she had one episode of soft stool ytd am but no bm since and has maintained a good appetite.  Also denies dysuria, hematuria, no vaginal discharge, bleeding or pelvic pain or cramping.  The history is provided by the patient.    Past Medical History:  Diagnosis Date  . Depression   . Frequent urination 09/28/2015  . HSV-2 (herpes simplex virus 2) infection   . Hx of chlamydia infection    multiple times  . Hx of gonorrhea   . Hx of trichomoniasis    recurrent, 4 times  . Hypertension   . Missed period 09/28/2015  . Pregnant   . Preterm labor   . STD (female)    chlamydia, trichomonas, gonorrhea, HPV, HSV    Patient Active Problem List   Diagnosis Date Noted  . Supervision of high risk pregnancy, antepartum 08/08/2018  . Essential hypertension, benign 08/01/2016  . Obesity 08/01/2016  . Cigarette nicotine dependence without complication 08/01/2016  . Major depressive disorder, single episode, severe, specified as with psychotic behavior 03/12/2014  . 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 Mood disorder, drug-induced (HCC) 03/11/2014  . Depression 11/25/2013  . Benign essential hypertension, antepartum 06/11/2013  . H/O preterm delivery, currently pregnant 03/18/2013  . HSV-2 infection 03/18/2013    Past Surgical  History:  Procedure Laterality Date  . NO PAST SURGERIES       OB History    Gravida  6   Para  4   Term  2   Preterm  2   AB  1   Living  4     SAB  1   TAB  0   Ectopic  0   Multiple  0   Live Births  4            Home Medications    Prior to Admission medications   Medication Sig Start Date End Date Taking? Authorizing Provider  Doxylamine-Pyridoxine 10-10 MG TBEC Two tablets at bedtime on day 1 and 2; if symptoms persist, take 1 tablet in morning and 2 tablets at bedtime on day 3; if symptoms persist, may increase to 1 tablet in morning, 1 tablet mid-afternoon, and 2 tablets at bedtime on day 4 MAXIMUM DOSE 40 MG/DAY. Patient not taking: Reported on 08/08/2018 07/07/18   Derwood Kaplan, MD  methyldopa (ALDOMET) 250 MG tablet Take 1 tablet (250 mg total) by mouth 2 (two) times daily. Patient not taking: Reported on 08/14/2018 08/08/18   Jacklyn Shell, CNM  Prenatal MV-Min-FA-Omega-3 (PRENATAL GUMMIES/DHA & FA PO) Take by mouth.    [provider]    Family History Family History  Problem Relation Age of Onset  . Lupus Mother   . Cirrhosis Mother   . Hypertension Maternal Grandmother     Social History Social History   Tobacco Use  .  Smoking status: Former Smoker    Packs/day: 0.50    Years: 8.00    Pack years: 4.00    Types: Cigarettes  . Smokeless tobacco: Never Used  Substance Use Topics  . Alcohol use: Not Currently    Comment: occasionally  . Drug use: Not Currently    Types: Marijuana     Allergies   Motrin [ibuprofen]   Review of Systems Review of Systems  Constitutional: Negative for chills and fever.  HENT: Negative for congestion and sore throat.   Eyes: Negative.   Respiratory: Negative for chest tightness and shortness of breath.   Cardiovascular: Negative for chest pain.  Gastrointestinal: Positive for abdominal pain and diarrhea. Negative for nausea and vomiting.  Genitourinary: Negative.  Negative  for dysuria, pelvic pain, vaginal bleeding, vaginal discharge and vaginal pain.  Musculoskeletal: Negative for arthralgias, joint swelling and neck pain.  Skin: Negative.  Negative for rash and wound.  Neurological: Negative for dizziness, weakness, light-headedness, numbness and headaches.  Psychiatric/Behavioral: Negative.      Physical Exam Updated Vital Signs BP 121/77 (BP Location: Right Arm)   Pulse 81   Temp 98.1 F (36.7 C) (Oral)   Resp 16   Ht 5\' 2"  (1.575 m)   Wt 90.7 kg   LMP 06/02/2018   SpO2 97%   BMI 36.58 kg/m   Physical Exam  Constitutional: She appears well-developed and well-nourished.  HENT:  Head: Normocephalic and atraumatic.  Eyes: Conjunctivae are normal.  Neck: Normal range of motion.  Cardiovascular: Normal rate, regular rhythm, normal heart sounds and intact distal pulses.  Pulmonary/Chest: Effort normal and breath sounds normal. She has no wheezes.  Abdominal: Soft. Bowel sounds are normal. She exhibits no mass. There is no tenderness.  Pt sx free at time of exam.  No reproducible pain.  Musculoskeletal: Normal range of motion.  Neurological: She is alert.  Skin: Skin is warm and dry.  Psychiatric: She has a normal mood and affect.  Nursing note and vitals reviewed.  Fetal heart rate 153.  ED Treatments / Results  Labs (all labs ordered are listed, but only abnormal results are displayed) Labs Reviewed  COMPREHENSIVE METABOLIC PANEL - Abnormal; Notable for the following components:      Result Value   Albumin 3.3 (*)    All other components within normal limits  URINALYSIS, ROUTINE W REFLEX MICROSCOPIC - Abnormal; Notable for the following components:   APPearance HAZY (*)    Ketones, ur 80 (*)    Leukocytes, UA TRACE (*)    Bacteria, UA RARE (*)    All other components within normal limits  CBC WITH DIFFERENTIAL/PLATELET - Abnormal; Notable for the following components:   WBC 12.0 (*)    RBC 3.86 (*)    Hemoglobin 11.3 (*)    HCT  33.6 (*)    Neutro Abs 7.8 (*)    All other components within normal limits  LIPASE, BLOOD    EKG None  Radiology No results found.  Procedures Procedures (including critical care time)  Medications Ordered in ED Medications  sodium chloride 0.9 % bolus 1,000 mL (1,000 mLs Intravenous New Bag/Given 09/02/18 1320)     Initial Impression / Assessment and Plan / ED Course  I have reviewed the triage vital signs and the nursing notes.  Pertinent labs & imaging results that were available during my care of the patient were reviewed by me and considered in my medical decision making (see chart for details).  Fleeting intermittent right flank pain triggered with movement, possible round ligament pain, currently sx free.  Labs stable, no indication that this is from renal or gallbladder source with normal labs.  She has f/u with her obgyn in 3 days, advised keeping this appt, tylenol prn for any return of sx.    Pt seen by Dr Estell HarpinZammit during visit.  Final Clinical Impressions(s) / ED Diagnoses   Final diagnoses:  Abdominal pain, unspecified abdominal location    ED Discharge Orders    None       Victoriano Laindol, Samanda Buske, PA-C 09/02/18 1401    Bethann BerkshireZammit, Joseph, MD 09/02/18 1444

## 2018-09-05 ENCOUNTER — Encounter: Payer: Self-pay | Admitting: Advanced Practice Midwife

## 2018-09-05 ENCOUNTER — Ambulatory Visit (INDEPENDENT_AMBULATORY_CARE_PROVIDER_SITE_OTHER): Payer: Medicaid Other | Admitting: Advanced Practice Midwife

## 2018-09-05 VITALS — BP 120/83 | HR 87 | Wt 204.0 lb

## 2018-09-05 DIAGNOSIS — O09219 Supervision of pregnancy with history of pre-term labor, unspecified trimester: Secondary | ICD-10-CM

## 2018-09-05 DIAGNOSIS — O09899 Supervision of other high risk pregnancies, unspecified trimester: Secondary | ICD-10-CM

## 2018-09-05 DIAGNOSIS — Z3A13 13 weeks gestation of pregnancy: Secondary | ICD-10-CM

## 2018-09-05 DIAGNOSIS — O10019 Pre-existing essential hypertension complicating pregnancy, unspecified trimester: Secondary | ICD-10-CM

## 2018-09-05 DIAGNOSIS — O0991 Supervision of high risk pregnancy, unspecified, first trimester: Secondary | ICD-10-CM

## 2018-09-05 DIAGNOSIS — Z1389 Encounter for screening for other disorder: Secondary | ICD-10-CM

## 2018-09-05 DIAGNOSIS — Z331 Pregnant state, incidental: Secondary | ICD-10-CM

## 2018-09-05 DIAGNOSIS — Z363 Encounter for antenatal screening for malformations: Secondary | ICD-10-CM

## 2018-09-05 LAB — POCT URINALYSIS DIPSTICK OB
Blood, UA: NEGATIVE
Glucose, UA: NEGATIVE
Leukocytes, UA: NEGATIVE
Nitrite, UA: NEGATIVE

## 2018-09-05 MED ORDER — ASPIRIN EC 81 MG PO TBEC: 162.0000 mg | DELAYED_RELEASE_TABLET | Freq: Every day | ORAL | 6 refills | Status: DC

## 2018-09-05 NOTE — Addendum Note (Signed)
Addended by: Jacklyn ShellRESENZO-DISHMON, Terasa Orsini on: 09/05/2018 10:29 AM   Modules accepted: Orders

## 2018-09-05 NOTE — Progress Notes (Addendum)
HIGH-RISK PREGNANCY VISIT Patient name: Stephanie Cook MRN [id number]  Date of birth: 1986-09-18 Chief Complaint:   Routine Prenatal Visit  History of Present Illness:   Stephanie Cook is a 32 y.o. [id number] female at [redacted]w[redacted]d with an Estimated Date of Delivery: 03/09/19 being seen today for ongoing management of a high-risk pregnancy complicated by chronic HTN.  Today she reports no complaints.Some bilateral side pain, sounds muscular Contractions: Not present.  .   . denies leaking of fluid.  Review of Systems:   Pertinent items are noted in HPI Denies abnormal vaginal discharge w/ itching/odor/irritation, headaches, visual changes, shortness of breath, chest pain, abdominal pain, severe nausea/vomiting, or problems with urination or bowel movements unless otherwise stated above.    Pertinent History Reviewed:  Medical & Surgical Hx:   Past Medical History:  Diagnosis Date  . Depression   . Frequent urination 09/28/2015  . HSV-2 (herpes simplex virus 2) infection   . Hx of chlamydia infection    multiple times  . Hx of gonorrhea   . Hx of trichomoniasis    recurrent, 4 times  . Hypertension   . Missed period 09/28/2015  . Pregnant   . Preterm labor   . STD (female)    chlamydia, trichomonas, gonorrhea, HPV, HSV   Past Surgical History:  Procedure Laterality Date  . NO PAST SURGERIES     Family History  Problem Relation Age of Onset  . Lupus Mother   . Cirrhosis Mother   . Hypertension Maternal Grandmother     Current Outpatient Medications:  .  aspirin EC 81 MG tablet, Take 81 mg by mouth daily., Disp: , Rfl:  .  methyldopa (ALDOMET) 250 MG tablet, Take 1 tablet (250 mg total) by mouth 2 (two) times daily., Disp: 60 tablet, Rfl: 6 .  Prenatal MV-Min-FA-Omega-3 (PRENATAL GUMMIES/DHA & FA PO), Take by mouth., Disp: , Rfl:  Social History: Reviewed -  reports that she has quit smoking. Her smoking use included cigarettes. She has a 4.00 pack-year smoking history. She  has never used smokeless tobacco.   Physical Assessment:   Vitals:   09/05/18 0951  BP: 120/83  Pulse: 87  Weight: 204 lb (92.5 kg)  Body mass index is 37.31 kg/m.           Physical Examination:   General appearance: alert, well appearing, and in no distress  Mental status: alert, oriented to person, place, and time  Skin: warm & dry   Extremities: Edema: None    Cardiovascular: normal heart rate noted  Respiratory: normal respiratory effort, no distress  Abdomen: gravid, soft, non-tender  Pelvic: Cervical exam deferred         Fetal Status: Fetal Heart Rate (bpm): 150        Fetal Surveillance Testing today: doppler  Results for orders placed or performed in visit on 09/05/18 (from the past 24 hour(s))  POC Urinalysis Dipstick OB   Collection Time: 09/05/18  9:59 AM  Result Value Ref Range   Color, UA     Clarity, UA     Glucose, UA Negative Negative   Bilirubin, UA     Ketones, UA small    Spec Grav, UA     Blood, UA neg    pH, UA     POC Protein UA Trace Negative, Trace   Urobilinogen, UA     Nitrite, UA neg    Leukocytes, UA Negative Negative   Appearance     Odor  Assessment & Plan:  1) High-risk pregnancy [id number] at 6360w4d with an Estimated Date of Delivery: 03/09/19   2) CHTN, stable  3) PTD X2, 17p ordered today    Medications: aldomet 250mg  BID, ASA 162 mg   Treatment Plan: growth US 20,28,34, 38      2x/wk testing @32weeks , alternating NST with sono              IOL @ 39wks        Follow-up: Return in about 18 days (around 09/23/2018) for for 17p & 5 weeks for anatomy scan/HROB.  Orders Placed This Encounter  Procedures  . US OB Comp + 14 Wk  . POC Urinalysis Dipstick OB   Jacklyn ShellFrances Cresenzo-Dishmon CNM 09/05/2018 10:23 AM

## 2018-09-24 ENCOUNTER — Telehealth: Payer: Self-pay | Admitting: *Deleted

## 2018-09-24 ENCOUNTER — Ambulatory Visit (INDEPENDENT_AMBULATORY_CARE_PROVIDER_SITE_OTHER): Payer: Medicaid Other

## 2018-09-24 VITALS — BP 132/82 | HR 87 | Ht 62.0 in | Wt 205.0 lb

## 2018-09-24 DIAGNOSIS — O09212 Supervision of pregnancy with history of pre-term labor, second trimester: Secondary | ICD-10-CM

## 2018-09-24 DIAGNOSIS — O09892 Supervision of other high risk pregnancies, second trimester: Secondary | ICD-10-CM

## 2018-09-24 DIAGNOSIS — Z331 Pregnant state, incidental: Secondary | ICD-10-CM

## 2018-09-24 DIAGNOSIS — Z1389 Encounter for screening for other disorder: Secondary | ICD-10-CM

## 2018-09-24 LAB — POCT URINALYSIS DIPSTICK OB
Blood, UA: NEGATIVE
Glucose, UA: NEGATIVE
Ketones, UA: NEGATIVE
LEUKOCYTES UA: NEGATIVE
Nitrite, UA: NEGATIVE

## 2018-09-24 MED ORDER — HYDROXYPROGESTERONE CAPROATE 275 MG/1.1ML ~~LOC~~ SOAJ
275.0000 mg | Freq: Once | SUBCUTANEOUS | Status: AC
  Administered 2018-09-24: 275 mg via SUBCUTANEOUS

## 2018-09-24 NOTE — Telephone Encounter (Signed)
Patient states she received the 17P auto injector today at 9am and her arm is still hurting. She is wanting the other shot.  Informed patient MCD preferred auto injector.  Will see how she feels at her next visit.  Verbalized understanding.

## 2018-09-24 NOTE — Progress Notes (Signed)
Pt here Stephanie Cook 275 mg SQ rt arm. Tolerated well. Return 1 week for next injection. Pad CMA

## 2018-10-01 ENCOUNTER — Ambulatory Visit (INDEPENDENT_AMBULATORY_CARE_PROVIDER_SITE_OTHER): Payer: Medicaid Other

## 2018-10-01 VITALS — BP 132/89 | HR 84 | Ht 62.0 in | Wt 204.8 lb

## 2018-10-01 DIAGNOSIS — O09892 Supervision of other high risk pregnancies, second trimester: Secondary | ICD-10-CM

## 2018-10-01 DIAGNOSIS — Z331 Pregnant state, incidental: Secondary | ICD-10-CM

## 2018-10-01 DIAGNOSIS — O09212 Supervision of pregnancy with history of pre-term labor, second trimester: Secondary | ICD-10-CM | POA: Diagnosis not present

## 2018-10-01 DIAGNOSIS — Z1389 Encounter for screening for other disorder: Secondary | ICD-10-CM

## 2018-10-01 LAB — POCT URINALYSIS DIPSTICK OB
Blood, UA: NEGATIVE
Glucose, UA: NEGATIVE
Ketones, UA: NEGATIVE
Leukocytes, UA: NEGATIVE
Nitrite, UA: NEGATIVE

## 2018-10-01 MED ORDER — HYDROXYPROGESTERONE CAPROATE 275 MG/1.1ML ~~LOC~~ SOAJ
275.0000 mg | Freq: Once | SUBCUTANEOUS | Status: AC
  Administered 2018-10-01: 275 mg via SUBCUTANEOUS

## 2018-10-01 NOTE — Progress Notes (Signed)
Pt here for Makena 275 mg given SQ lt arm. Tolerated well. Return 1 week for next injection. Pad CMA

## 2018-10-08 ENCOUNTER — Encounter: Payer: Self-pay | Admitting: *Deleted

## 2018-10-08 ENCOUNTER — Ambulatory Visit (INDEPENDENT_AMBULATORY_CARE_PROVIDER_SITE_OTHER): Payer: Medicaid Other | Admitting: *Deleted

## 2018-10-08 ENCOUNTER — Other Ambulatory Visit: Payer: Self-pay

## 2018-10-08 VITALS — BP 123/79 | HR 95 | Ht 62.0 in | Wt 210.0 lb

## 2018-10-08 DIAGNOSIS — Z8751 Personal history of pre-term labor: Secondary | ICD-10-CM | POA: Diagnosis not present

## 2018-10-08 DIAGNOSIS — Z331 Pregnant state, incidental: Secondary | ICD-10-CM

## 2018-10-08 DIAGNOSIS — Z1389 Encounter for screening for other disorder: Secondary | ICD-10-CM

## 2018-10-08 LAB — POCT URINALYSIS DIPSTICK OB
GLUCOSE, UA: NEGATIVE
Ketones, UA: NEGATIVE
Nitrite, UA: NEGATIVE
POC,PROTEIN,UA: NEGATIVE
RBC UA: NEGATIVE

## 2018-10-08 MED ORDER — HYDROXYPROGESTERONE CAPROATE 275 MG/1.1ML ~~LOC~~ SOAJ
275.0000 mg | SUBCUTANEOUS | Status: DC
  Administered 2018-10-08 – 2018-10-31 (×4): 275 mg via SUBCUTANEOUS

## 2018-10-08 NOTE — Progress Notes (Signed)
Pt given Makena 275mg  SQ right arm without complications. Advised to return in 1 week for next injection. Denies LOF, vaginal bleeding, swelling, pelvic pain or pressure.

## 2018-10-10 ENCOUNTER — Ambulatory Visit (INDEPENDENT_AMBULATORY_CARE_PROVIDER_SITE_OTHER): Payer: Medicaid Other | Admitting: Advanced Practice Midwife

## 2018-10-10 ENCOUNTER — Ambulatory Visit (INDEPENDENT_AMBULATORY_CARE_PROVIDER_SITE_OTHER): Payer: Medicaid Other

## 2018-10-10 VITALS — BP 134/83 | HR 94 | Wt 209.0 lb

## 2018-10-10 DIAGNOSIS — O09212 Supervision of pregnancy with history of pre-term labor, second trimester: Secondary | ICD-10-CM

## 2018-10-10 DIAGNOSIS — Z331 Pregnant state, incidental: Secondary | ICD-10-CM

## 2018-10-10 DIAGNOSIS — O0990 Supervision of high risk pregnancy, unspecified, unspecified trimester: Secondary | ICD-10-CM

## 2018-10-10 DIAGNOSIS — Z3A18 18 weeks gestation of pregnancy: Secondary | ICD-10-CM

## 2018-10-10 DIAGNOSIS — Z363 Encounter for antenatal screening for malformations: Secondary | ICD-10-CM | POA: Diagnosis not present

## 2018-10-10 DIAGNOSIS — O10019 Pre-existing essential hypertension complicating pregnancy, unspecified trimester: Secondary | ICD-10-CM

## 2018-10-10 DIAGNOSIS — O09219 Supervision of pregnancy with history of pre-term labor, unspecified trimester: Secondary | ICD-10-CM

## 2018-10-10 DIAGNOSIS — O0992 Supervision of high risk pregnancy, unspecified, second trimester: Secondary | ICD-10-CM

## 2018-10-10 DIAGNOSIS — O10012 Pre-existing essential hypertension complicating pregnancy, second trimester: Secondary | ICD-10-CM

## 2018-10-10 DIAGNOSIS — Z1389 Encounter for screening for other disorder: Secondary | ICD-10-CM

## 2018-10-10 DIAGNOSIS — O09899 Supervision of other high risk pregnancies, unspecified trimester: Secondary | ICD-10-CM

## 2018-10-10 LAB — POCT URINALYSIS DIPSTICK OB
Blood, UA: NEGATIVE
Glucose, UA: NEGATIVE
Ketones, UA: NEGATIVE
LEUKOCYTES UA: NEGATIVE
NITRITE UA: NEGATIVE
PROTEIN: NEGATIVE

## 2018-10-10 NOTE — Patient Instructions (Signed)
Stephanie Cook, I greatly value your feedback.  If you receive a survey following your visit with [place] today, we appreciate you taking the time to fill it out.  Thanks, Stephanie Cook, CNM     Second Trimester of Pregnancy The second trimester is from week 14 through week 27 (months 4 through 6). The second trimester is often a time when you feel your best. Your body has adjusted to being pregnant, and you begin to feel better physically. Usually, morning sickness has lessened or quit completely, you may have more energy, and you may have an increase in appetite. The second trimester is also a time when the fetus is growing rapidly. At the end of the sixth month, the fetus is about 9 inches long and weighs about 1 pounds. You will likely begin to feel the baby move (quickening) between 16 and 20 weeks of pregnancy. Body changes during your second trimester Your body continues to go through many changes during your second trimester. The changes vary from woman to woman.  Your weight will continue to increase. You will notice your lower abdomen bulging out.  You may begin to get stretch marks on your hips, abdomen, and breasts.  You may develop headaches that can be relieved by medicines. The medicines should be approved by your health care provider.  You may urinate more often because the fetus is pressing on your bladder.  You may develop or continue to have heartburn as a result of your pregnancy.  You may develop constipation because certain hormones are causing the muscles that push waste through your intestines to slow down.  You may develop hemorrhoids or swollen, bulging veins (varicose veins).  You may have back pain. This is caused by: ? Weight gain. ? Pregnancy hormones that are relaxing the joints in your pelvis. ? A shift in weight and the muscles that support your balance.  Your breasts will continue to grow and they will continue to become tender.  Your gums  may bleed and may be sensitive to brushing and flossing.  Dark spots or blotches (chloasma, mask of pregnancy) may develop on your face. This will likely fade after the baby is born.  A dark line from your belly button to the pubic area (linea nigra) may appear. This will likely fade after the baby is born.  You may have changes in your hair. These can include thickening of your hair, rapid growth, and changes in texture. Some women also have hair loss during or after pregnancy, or hair that feels dry or thin. Your hair will most likely return to normal after your baby is born.  What to expect at prenatal visits During a routine prenatal visit:  You will be weighed to make sure you and the fetus are growing normally.  Your blood pressure will be taken.  Your abdomen will be measured to track your baby's growth.  The fetal heartbeat will be listened to.  Any test results from the previous visit will be discussed.  Your health care provider may ask you:  How you are feeling.  If you are feeling the baby move.  If you have had any abnormal symptoms, such as leaking fluid, bleeding, severe headaches, or abdominal cramping.  If you are using any tobacco products, including cigarettes, chewing tobacco, and electronic cigarettes.  If you have any questions.  Other tests that may be performed during your second trimester include:  Blood tests that check for: ? Low iron levels (anemia). ?  High blood sugar that affects pregnant women (gestational diabetes) between 20 and 28 weeks. ? Rh antibodies. This is to check for a protein on red blood cells (Rh factor).  Urine tests to check for infections, diabetes, or protein in the urine.  An ultrasound to confirm the proper growth and development of the baby.  An amniocentesis to check for possible genetic problems.  Fetal screens for spina bifida and Down syndrome.  HIV (human immunodeficiency virus) testing. Routine prenatal testing  includes screening for HIV, unless you choose not to have this test.  Follow these instructions at home: Medicines  Follow your health care provider's instructions regarding medicine use. Specific medicines may be either safe or unsafe to take during pregnancy.  Take a prenatal vitamin that contains at least 600 micrograms (mcg) of folic acid.  If you develop constipation, try taking a stool softener if your health care provider approves. Eating and drinking  Eat a balanced diet that includes fresh fruits and vegetables, whole grains, good sources of protein such as meat, eggs, or tofu, and low-fat dairy. Your health care provider will help you determine the amount of weight gain that is right for you.  Avoid raw meat and uncooked cheese. These carry germs that can cause birth defects in the baby.  If you have low calcium intake from food, talk to your health care provider about whether you should take a daily calcium supplement.  Limit foods that are high in fat and processed sugars, such as fried and sweet foods.  To prevent constipation: ? Drink enough fluid to keep your urine clear or pale yellow. ? Eat foods that are high in fiber, such as fresh fruits and vegetables, whole grains, and beans. Activity  Exercise only as directed by your health care provider. Most women can continue their usual exercise routine during pregnancy. Try to exercise for 30 minutes at least 5 days a week. Stop exercising if you experience uterine contractions.  Avoid heavy lifting, wear low heel shoes, and practice good posture.  A sexual relationship may be continued unless your health care provider directs you otherwise. Relieving pain and discomfort  Wear a good support bra to prevent discomfort from breast tenderness.  Take warm sitz baths to soothe any pain or discomfort caused by hemorrhoids. Use hemorrhoid cream if your health care provider approves.  Rest with your legs elevated if you have  leg cramps or low back pain.  If you develop varicose veins, wear support hose. Elevate your feet for 15 minutes, 3-4 times a day. Limit salt in your diet. Prenatal Care  Write down your questions. Take them to your prenatal visits.  Keep all your prenatal visits as told by your health care provider. This is important. Safety  Wear your seat belt at all times when driving.  Make a list of emergency phone numbers, including numbers for family, friends, the hospital, and police and fire departments. General instructions  Ask your health care provider for a referral to a local prenatal education class. Begin classes no later than the beginning of month 6 of your pregnancy.  Ask for help if you have counseling or nutritional needs during pregnancy. Your health care provider can offer advice or refer you to specialists for help with various needs.  Do not use hot tubs, steam rooms, or saunas.  Do not douche or use tampons or scented sanitary pads.  Do not cross your legs for long periods of time.  Avoid cat litter boxes and  soil used by cats. These carry germs that can cause birth defects in the baby and possibly loss of the fetus by miscarriage or stillbirth.  Avoid all smoking, herbs, alcohol, and unprescribed drugs. Chemicals in these products can affect the formation and growth of the baby.  Do not use any products that contain nicotine or tobacco, such as cigarettes and e-cigarettes. If you need help quitting, ask your health care provider.  Visit your dentist if you have not gone yet during your pregnancy. Use a soft toothbrush to brush your teeth and be gentle when you floss. Contact a health care provider if:  You have dizziness.  You have mild pelvic cramps, pelvic pressure, or nagging pain in the abdominal area.  You have persistent nausea, vomiting, or diarrhea.  You have a bad smelling vaginal discharge.  You have pain when you urinate. Get help right away if:  You  have a fever.  You are leaking fluid from your vagina.  You have spotting or bleeding from your vagina.  You have severe abdominal cramping or pain.  You have rapid weight gain or weight loss.  You have shortness of breath with chest pain.  You notice sudden or extreme swelling of your face, hands, ankles, feet, or legs.  You have not felt your baby move in over an hour.  You have severe headaches that do not go away when you take medicine.  You have vision changes. Summary  The second trimester is from week 14 through week 27 (months 4 through 6). It is also a time when the fetus is growing rapidly.  Your body goes through many changes during pregnancy. The changes vary from woman to woman.  Avoid all smoking, herbs, alcohol, and unprescribed drugs. These chemicals affect the formation and growth your baby.  Do not use any tobacco products, such as cigarettes, chewing tobacco, and e-cigarettes. If you need help quitting, ask your health care provider.  Contact your health care provider if you have any questions. Keep all prenatal visits as told by your health care provider. This is important. This information is not intended to replace advice given to you by your health care provider. Make sure you discuss any questions you have with your health care provider.      CHILDBIRTH CLASSES [phone] is the phone number for Pregnancy Classes or hospital tours at [place] will be referred to  [email or web address] for more information on childbirth classes  At this site you may register for classes. You may sign up for a waiting list if classes are full. Please SIGN UP FOR THIS!.   When the waiting list becomes long, sometimes new classes can be added.

## 2018-10-10 NOTE — Progress Notes (Signed)
HIGH-RISK PREGNANCY VISIT Patient name: Stephanie Cook MRN [id number]  Date of birth: 1986/04/21 Chief Complaint:   Routine Prenatal Visit ([place] today)  History of Present Illness:   Stephanie Cook is a 32 y.o. [phone] female at [redacted]w[redacted]d with an Estimated Date of Delivery: 03/09/19 being seen today for ongoing management of a high-risk pregnancy complicated by chronic HTN.  Today she reports no complaints. Contractions: Not present. Vag. Bleeding: None.   . denies leaking of fluid.  Review of Systems:   Pertinent items are noted in HPI Denies abnormal vaginal discharge w/ itching/odor/irritation, headaches, visual changes, shortness of breath, chest pain, abdominal pain, severe nausea/vomiting, or problems with urination or bowel movements unless otherwise stated above.    Pertinent History Reviewed:  Medical & Surgical Hx:   Past Medical History:  Diagnosis Date  . Depression   . Frequent urination 09/28/2015  . HSV-2 (herpes simplex virus 2) infection   . Hx of chlamydia infection    multiple times  . Hx of gonorrhea   . Hx of trichomoniasis    recurrent, 4 times  . Hypertension   . Missed period 09/28/2015  . Pregnant   . Preterm labor   . STD (female)    chlamydia, trichomonas, gonorrhea, HPV, HSV   Past Surgical History:  Procedure Laterality Date  . NO PAST SURGERIES     Family History  Problem Relation Age of Onset  . Lupus Mother   . Cirrhosis Mother   . Hypertension Maternal Grandmother     Current Outpatient Medications:  .  aspirin EC 81 MG tablet, Take 2 tablets (162 mg total) by mouth daily., Disp: 60 tablet, Rfl: 6 .  methyldopa (ALDOMET) 250 MG tablet, Take 1 tablet (250 mg total) by mouth 2 (two) times daily., Disp: 60 tablet, Rfl: 6 .  Prenatal MV-Min-FA-Omega-3 (PRENATAL GUMMIES/DHA & FA PO), Take by mouth., Disp: , Rfl:   Current Facility-Administered Medications:  .  HYDROXYprogesterone Caproate SOAJ 275 mg, 275 mg, Subcutaneous, Weekly, Lazaro Arms, MD, 275 mg at 10/08/18 1308 Social History: Reviewed -  reports that she has quit smoking. Her smoking use included cigarettes. She has a 4.00 pack-year smoking history. She has never used smokeless tobacco.   Physical Assessment:   Vitals:   10/10/18 1026  BP: 134/83  Pulse: 94  Weight: 209 lb (94.8 kg)  Body mass index is 38.23 kg/m.           Physical Examination:   General appearance: alert, well appearing, and in no distress  Mental status: alert, oriented to person, place, and time  Skin: warm & dry   Extremities: Edema: None    Cardiovascular: normal heart rate noted  Respiratory: normal respiratory effort, no distress  Abdomen: gravid, soft, non-tender  Pelvic: Cervical exam deferred         Fetal Status: Fetal Heart Rate (bpm): 150us        Fetal Surveillance Testing today: [place] 18+4 wks,cephalic,fhr 150 bpm,cx 3.7 cm,posterior pl gr 0,normal ovaries bilat,svp of fluid 5 cm,efw 262 g 62%,anatomy complete,no obvious abnormalities   Results for orders placed or performed in visit on 10/10/18 (from the past 24 hour(s))  POC Urinalysis Dipstick OB   Collection Time: 10/10/18 10:29 AM  Result Value Ref Range   Color, UA     Clarity, UA     Glucose, UA Negative Negative   Bilirubin, UA     Ketones, UA neg    Spec Grav, UA  Blood, UA neg    pH, UA     POC Protein UA Negative Negative, Trace   Urobilinogen, UA     Nitrite, UA neg    Leukocytes, UA Negative Negative   Appearance     Odor      Assessment & Plan:  1) High-risk pregnancy [id number] at [redacted]w[redacted]d with an Estimated Date of Delivery: 03/09/19   2) CHTN, stable  3) hx PTD, continue weekly 17p  Labs/procedures today: [place]  Treatment Plan:        Follow-up: Return in about 4 weeks (around 11/07/2018) for HROB. weekly 170.  Orders Placed This Encounter  Procedures  . POC Urinalysis Dipstick OB   Jacklyn Shell CNM 10/10/2018 11:00 AM

## 2018-10-10 NOTE — Progress Notes (Signed)
[place] 18+4 wks,cephalic,fhr 150 bpm,cx 3.7 cm,posterior pl gr 0,normal ovaries bilat,svp of fluid 5 cm,efw 262 g 62%,anatomy complete,no obvious abnormalities

## 2018-10-15 ENCOUNTER — Ambulatory Visit (INDEPENDENT_AMBULATORY_CARE_PROVIDER_SITE_OTHER): Payer: Medicaid Other

## 2018-10-15 VITALS — BP 118/87 | HR 85 | Ht 62.0 in | Wt 208.0 lb

## 2018-10-15 DIAGNOSIS — O09212 Supervision of pregnancy with history of pre-term labor, second trimester: Principal | ICD-10-CM

## 2018-10-15 DIAGNOSIS — O09892 Supervision of other high risk pregnancies, second trimester: Secondary | ICD-10-CM

## 2018-10-15 DIAGNOSIS — Z331 Pregnant state, incidental: Secondary | ICD-10-CM

## 2018-10-15 DIAGNOSIS — Z8751 Personal history of pre-term labor: Secondary | ICD-10-CM

## 2018-10-15 DIAGNOSIS — Z1389 Encounter for screening for other disorder: Secondary | ICD-10-CM

## 2018-10-15 LAB — POCT URINALYSIS DIPSTICK OB
Glucose, UA: NEGATIVE
KETONES UA: NEGATIVE
Leukocytes, UA: NEGATIVE
NITRITE UA: NEGATIVE
RBC UA: NEGATIVE

## 2018-10-15 MED ORDER — HYDROXYPROGESTERONE CAPROATE 275 MG/1.1ML ~~LOC~~ SOAJ: 275.0000 mg | Freq: Once | SUBCUTANEOUS | Status: DC

## 2018-10-15 NOTE — Progress Notes (Signed)
Pt here Stephanie Cook 275 mg IM given [place] lt arm. Tolerated well. Return 1 week for next shot.Pad CMA

## 2018-10-22 ENCOUNTER — Ambulatory Visit (INDEPENDENT_AMBULATORY_CARE_PROVIDER_SITE_OTHER): Payer: Medicaid Other

## 2018-10-22 VITALS — BP 132/85 | HR 96 | Ht 62.0 in | Wt 208.6 lb

## 2018-10-22 DIAGNOSIS — Z1389 Encounter for screening for other disorder: Secondary | ICD-10-CM

## 2018-10-22 DIAGNOSIS — Z8751 Personal history of pre-term labor: Secondary | ICD-10-CM

## 2018-10-22 DIAGNOSIS — Z331 Pregnant state, incidental: Secondary | ICD-10-CM

## 2018-10-22 DIAGNOSIS — O09892 Supervision of other high risk pregnancies, second trimester: Secondary | ICD-10-CM

## 2018-10-22 DIAGNOSIS — O09212 Supervision of pregnancy with history of pre-term labor, second trimester: Principal | ICD-10-CM

## 2018-10-22 LAB — POCT URINALYSIS DIPSTICK OB
Blood, UA: NEGATIVE
GLUCOSE, UA: NEGATIVE
KETONES UA: NEGATIVE
Leukocytes, UA: NEGATIVE
Nitrite, UA: NEGATIVE

## 2018-10-22 MED ORDER — HYDROXYPROGESTERONE CAPROATE 275 MG/1.1ML ~~LOC~~ SOAJ: 275.0000 mg | Freq: Once | SUBCUTANEOUS | Status: DC

## 2018-10-22 NOTE — Progress Notes (Signed)
Pt here for Makena 275 mg SQ given lt arm. Rt arm staying sore for 2 or 3 days. Spoke Dr. Despina Hidden about giving injection back in lt arm."states it okay". Return in one week for next injection.Pad CMA.

## 2018-10-24 ENCOUNTER — Telehealth: Payer: Self-pay | Admitting: Advanced Practice Midwife

## 2018-10-24 ENCOUNTER — Other Ambulatory Visit: Payer: Self-pay | Admitting: Advanced Practice Midwife

## 2018-10-24 MED ORDER — ALBUTEROL SULFATE HFA 108 (90 BASE) MCG/ACT IN AERS: 2.0000 | INHALATION_SPRAY | Freq: Four times a day (QID) | RESPIRATORY_TRACT | 2 refills | Status: DC | PRN

## 2018-10-24 NOTE — Telephone Encounter (Signed)
Would like to speak to a nurse. Would like someone to call her back

## 2018-10-24 NOTE — Progress Notes (Signed)
Wheezes, some SOB.  Used tobe just when on [place], having more often now. Rx albuterol

## 2018-10-29 ENCOUNTER — Ambulatory Visit: Payer: Medicaid Other

## 2018-10-30 ENCOUNTER — Ambulatory Visit: Payer: Medicaid Other

## 2018-10-31 ENCOUNTER — Ambulatory Visit (INDEPENDENT_AMBULATORY_CARE_PROVIDER_SITE_OTHER): Payer: Medicaid Other

## 2018-10-31 VITALS — BP 134/87 | HR 105 | Ht 62.0 in | Wt 214.4 lb

## 2018-10-31 DIAGNOSIS — O09212 Supervision of pregnancy with history of pre-term labor, second trimester: Secondary | ICD-10-CM

## 2018-10-31 DIAGNOSIS — Z1389 Encounter for screening for other disorder: Secondary | ICD-10-CM

## 2018-10-31 DIAGNOSIS — Z331 Pregnant state, incidental: Secondary | ICD-10-CM

## 2018-10-31 DIAGNOSIS — O09892 Supervision of other high risk pregnancies, second trimester: Secondary | ICD-10-CM

## 2018-10-31 LAB — POCT URINALYSIS DIPSTICK OB
GLUCOSE, UA: NEGATIVE
KETONES UA: NEGATIVE
Leukocytes, UA: NEGATIVE
Nitrite, UA: NEGATIVE
POC,PROTEIN,UA: NEGATIVE
RBC UA: NEGATIVE

## 2018-10-31 MED ORDER — HYDROXYPROGESTERONE CAPROATE 275 MG/1.1ML ~~LOC~~ SOAJ: 275.0000 mg | Freq: Once | SUBCUTANEOUS | Status: DC

## 2018-10-31 NOTE — Progress Notes (Signed)
Pt here for Makena 275 mg SQ rt arm. Tolerated well. Return 1 week for next injection.Pad CMA 

## 2018-11-07 ENCOUNTER — Encounter: Payer: Self-pay | Admitting: Women's Health

## 2018-11-07 ENCOUNTER — Ambulatory Visit (INDEPENDENT_AMBULATORY_CARE_PROVIDER_SITE_OTHER): Payer: Medicaid Other | Admitting: Women's Health

## 2018-11-07 VITALS — BP 133/84 | HR 96 | Wt 214.0 lb

## 2018-11-07 DIAGNOSIS — Z23 Encounter for immunization: Secondary | ICD-10-CM

## 2018-11-07 DIAGNOSIS — O09219 Supervision of pregnancy with history of pre-term labor, unspecified trimester: Secondary | ICD-10-CM

## 2018-11-07 DIAGNOSIS — Z1389 Encounter for screening for other disorder: Secondary | ICD-10-CM

## 2018-11-07 DIAGNOSIS — O0992 Supervision of high risk pregnancy, unspecified, second trimester: Secondary | ICD-10-CM

## 2018-11-07 DIAGNOSIS — O0990 Supervision of high risk pregnancy, unspecified, unspecified trimester: Secondary | ICD-10-CM

## 2018-11-07 DIAGNOSIS — O09899 Supervision of other high risk pregnancies, unspecified trimester: Secondary | ICD-10-CM

## 2018-11-07 DIAGNOSIS — Z331 Pregnant state, incidental: Secondary | ICD-10-CM

## 2018-11-07 DIAGNOSIS — O09212 Supervision of pregnancy with history of pre-term labor, second trimester: Secondary | ICD-10-CM

## 2018-11-07 DIAGNOSIS — O10919 Unspecified pre-existing hypertension complicating pregnancy, unspecified trimester: Secondary | ICD-10-CM

## 2018-11-07 DIAGNOSIS — O10912 Unspecified pre-existing hypertension complicating pregnancy, second trimester: Secondary | ICD-10-CM

## 2018-11-07 DIAGNOSIS — Z3A22 22 weeks gestation of pregnancy: Secondary | ICD-10-CM

## 2018-11-07 LAB — POCT URINALYSIS DIPSTICK OB
Blood, UA: NEGATIVE
Glucose, UA: NEGATIVE
Ketones, UA: NEGATIVE
Leukocytes, UA: NEGATIVE
NITRITE UA: NEGATIVE

## 2018-11-07 NOTE — Patient Instructions (Addendum)
Elray McgregorBrittany D Mansouri, I greatly value your feedback.  If you receive a survey following your visit with us today, we appreciate you taking the time to fill it out.  Thanks, Joellyn HaffKim Shakeyla Giebler, CNM, WHNP-BC   Second Trimester of Pregnancy The second trimester is from week 14 through week 27 (months 4 through 6). The second trimester is often a time when you feel your best. Your body has adjusted to being pregnant, and you begin to feel better physically. Usually, morning sickness has lessened or quit completely, you may have more energy, and you may have an increase in appetite. The second trimester is also a time when the fetus is growing rapidly. At the end of the sixth month, the fetus is about 9 inches long and weighs about 1 pounds. You will likely begin to feel the baby move (quickening) between 16 and 20 weeks of pregnancy. Body changes during your second trimester Your body continues to go through many changes during your second trimester. The changes vary from woman to woman.  Your weight will continue to increase. You will notice your lower abdomen bulging out.  You may begin to get stretch marks on your hips, abdomen, and breasts.  You may develop headaches that can be relieved by medicines. The medicines should be approved by your health care provider.  You may urinate more often because the fetus is pressing on your bladder.  You may develop or continue to have heartburn as a result of your pregnancy.  You may develop constipation because certain hormones are causing the muscles that push waste through your intestines to slow down.  You may develop hemorrhoids or swollen, bulging veins (varicose veins).  You may have back pain. This is caused by: ? Weight gain. ? Pregnancy hormones that are relaxing the joints in your pelvis. ? A shift in weight and the muscles that support your balance.  Your breasts will continue to grow and they will continue to become tender.  Your gums may  bleed and may be sensitive to brushing and flossing.  Dark spots or blotches (chloasma, mask of pregnancy) may develop on your face. This will likely fade after the baby is born.  A dark line from your belly button to the pubic area (linea nigra) may appear. This will likely fade after the baby is born.  You may have changes in your hair. These can include thickening of your hair, rapid growth, and changes in texture. Some women also have hair loss during or after pregnancy, or hair that feels dry or thin. Your hair will most likely return to normal after your baby is born.  What to expect at prenatal visits During a routine prenatal visit:  You will be weighed to make sure you and the fetus are growing normally.  Your blood pressure will be taken.  Your abdomen will be measured to track your baby's growth.  The fetal heartbeat will be listened to.  Any test results from the previous visit will be discussed.  Your health care provider may ask you:  How you are feeling.  If you are feeling the baby move.  If you have had any abnormal symptoms, such as leaking fluid, bleeding, severe headaches, or abdominal cramping.  If you are using any tobacco products, including cigarettes, chewing tobacco, and electronic cigarettes.  If you have any questions.  Other tests that may be performed during your second trimester include:  Blood tests that check for: ? Low iron levels (anemia). ? High  blood sugar that affects pregnant women (gestational diabetes) between 42 and 28 weeks. ? Rh antibodies. This is to check for a protein on red blood cells (Rh factor).  Urine tests to check for infections, diabetes, or protein in the urine.  An ultrasound to confirm the proper growth and development of the baby.  An amniocentesis to check for possible genetic problems.  Fetal screens for spina bifida and Down syndrome.  HIV (human immunodeficiency virus) testing. Routine prenatal testing  includes screening for HIV, unless you choose not to have this test.  Follow these instructions at home: Medicines  Follow your health care provider's instructions regarding medicine use. Specific medicines may be either safe or unsafe to take during pregnancy.  Take a prenatal vitamin that contains at least 600 micrograms (mcg) of folic acid.  If you develop constipation, try taking a stool softener if your health care provider approves. Eating and drinking  Eat a balanced diet that includes fresh fruits and vegetables, whole grains, good sources of protein such as meat, eggs, or tofu, and low-fat dairy. Your health care provider will help you determine the amount of weight gain that is right for you.  Avoid raw meat and uncooked cheese. These carry germs that can cause birth defects in the baby.  If you have low calcium intake from food, talk to your health care provider about whether you should take a daily calcium supplement.  Limit foods that are high in fat and processed sugars, such as fried and sweet foods.  To prevent constipation: ? Drink enough fluid to keep your urine clear or pale yellow. ? Eat foods that are high in fiber, such as fresh fruits and vegetables, whole grains, and beans. Activity  Exercise only as directed by your health care provider. Most women can continue their usual exercise routine during pregnancy. Try to exercise for 30 minutes at least 5 days a week. Stop exercising if you experience uterine contractions.  Avoid heavy lifting, wear low heel shoes, and practice good posture.  A sexual relationship may be continued unless your health care provider directs you otherwise. Relieving pain and discomfort  Wear a good support bra to prevent discomfort from breast tenderness.  Take warm sitz baths to soothe any pain or discomfort caused by hemorrhoids. Use hemorrhoid cream if your health care provider approves.  Rest with your legs elevated if you have  leg cramps or low back pain.  If you develop varicose veins, wear support hose. Elevate your feet for 15 minutes, 3-4 times a day. Limit salt in your diet. Prenatal Care  Write down your questions. Take them to your prenatal visits.  Keep all your prenatal visits as told by your health care provider. This is important. Safety  Wear your seat belt at all times when driving.  Make a list of emergency phone numbers, including numbers for family, friends, the hospital, and police and fire departments. General instructions  Ask your health care provider for a referral to a local prenatal education class. Begin classes no later than the beginning of month 6 of your pregnancy.  Ask for help if you have counseling or nutritional needs during pregnancy. Your health care provider can offer advice or refer you to specialists for help with various needs.  Do not use hot tubs, steam rooms, or saunas.  Do not douche or use tampons or scented sanitary pads.  Do not cross your legs for long periods of time.  Avoid cat litter boxes and soil  used by cats. These carry germs that can cause birth defects in the baby and possibly loss of the fetus by miscarriage or stillbirth.  Avoid all smoking, herbs, alcohol, and unprescribed drugs. Chemicals in these products can affect the formation and growth of the baby.  Do not use any products that contain nicotine or tobacco, such as cigarettes and e-cigarettes. If you need help quitting, ask your health care provider.  Visit your dentist if you have not gone yet during your pregnancy. Use a soft toothbrush to brush your teeth and be gentle when you floss. Contact a health care provider if:  You have dizziness.  You have mild pelvic cramps, pelvic pressure, or nagging pain in the abdominal area.  You have persistent nausea, vomiting, or diarrhea.  You have a bad smelling vaginal discharge.  You have pain when you urinate. Get help right away if:  You  have a fever.  You are leaking fluid from your vagina.  You have spotting or bleeding from your vagina.  You have severe abdominal cramping or pain.  You have rapid weight gain or weight loss.  You have shortness of breath with chest pain.  You notice sudden or extreme swelling of your face, hands, ankles, feet, or legs.  You have not felt your baby move in over an hour.  You have severe headaches that do not go away when you take medicine.  You have vision changes. Summary  The second trimester is from week 14 through week 27 (months 4 through 6). It is also a time when the fetus is growing rapidly.  Your body goes through many changes during pregnancy. The changes vary from woman to woman.  Avoid all smoking, herbs, alcohol, and unprescribed drugs. These chemicals affect the formation and growth your baby.  Do not use any tobacco products, such as cigarettes, chewing tobacco, and e-cigarettes. If you need help quitting, ask your health care provider.  Contact your health care provider if you have any questions. Keep all prenatal visits as told by your health care provider. This is important. This information is not intended to replace advice given to you by your health care provider. Make sure you discuss any questions you have with your health care provider. Document Released: 11/28/2001 Document Revised: 05/11/2016 Document Reviewed: 02/04/2013 Elsevier Interactive Patient Education  2017 [employer]  PROTECT YOURSELF & YOUR BABY FROM THE FLU! Because you are pregnant, we at [facility], along with the Centers for Disease Control (CDC), recommend that you receive the flu vaccine to protect yourself and your baby from the flu. The flu is more likely to cause severe illness in pregnant women than in women of reproductive age who are not pregnant. Changes in the immune system, heart, and lungs during pregnancy make pregnant women (and women up to two weeks postpartum) more  prone to severe illness from flu, including illness resulting in hospitalization. Flu also may be harmful for a pregnant woman's developing baby. A common flu symptom is fever, which may be associated with neural tube defects and other adverse outcomes for a developing baby. Getting vaccinated can also help protect a baby after birth from flu. (Mom passes antibodies onto the developing baby during her pregnancy.)  A Flu Vaccine is the Best Protection Against Flu Getting a flu vaccine is the first and most important step in protecting against flu. Pregnant women should get a flu shot and not the live attenuated influenza vaccine (LAIV), also known as nasal spray flu  vaccine. Flu vaccines given during pregnancy help protect both the mother and her baby from flu. Vaccination has been shown to reduce the risk of flu-associated acute respiratory infection in pregnant women by up to one-half. A 2018 study showed that getting a flu shot reduced a pregnant woman's risk of being hospitalized with flu by an average of 40 percent. Pregnant women who get a flu vaccine are also helping to protect their babies from flu illness for the first several months after their birth, when they are too young to get vaccinated.   A Long Record of Safety for Flu Shots in Pregnant Women Flu shots have been given to millions of pregnant women over many years with a good safety record. There is a lot of evidence that flu vaccines can be given safely during pregnancy; though these data are limited for the first trimester. The CDC recommends that pregnant women get vaccinated during any trimester of their pregnancy. It is very important for pregnant women to get the flu shot.   Other Preventive Actions In addition to getting a flu shot, pregnant women should take the same everyday preventive actions the CDC recommends of everyone, including covering coughs, washing hands often, and avoiding people who are sick.  Symptoms and  Treatment If you get sick with flu symptoms call your doctor right away. There are antiviral drugs that can treat flu illness and prevent serious flu complications. The CDC recommends prompt treatment for people who have influenza infection or suspected influenza infection and who are at high risk of serious flu complications, such as people with asthma, diabetes (including gestational diabetes), or heart disease. Early treatment of influenza in hospitalized pregnant women has been shown to reduce the length of the hospital stay.  Symptoms Flu symptoms include fever, cough, sore throat, runny or stuffy nose, body aches, headache, chills and fatigue. Some people may also have vomiting and diarrhea. People may be infected with the flu and have respiratory symptoms without a fever.  Early Treatment is Important for Pregnant Women Treatment should begin as soon as possible because antiviral drugs work best when started early (within 48 hours after symptoms start). Antiviral drugs can make your flu illness milder and make you feel better faster. They may also prevent serious health problems that can result from flu illness. Oral oseltamivir (Tamiflu) is the preferred treatment for pregnant women because it has the most studies available to suggest that it is safe and beneficial. Antiviral drugs require a prescription from your provider. Having a fever caused by flu infection or other infections early in pregnancy may be linked to birth defects in a baby. In addition to taking antiviral drugs, pregnant women who get a fever should treat their fever with Tylenol (acetaminophen) and contact their provider immediately.  When to Seek Emergency Medical Care If you are pregnant and have any of these signs, seek care immediately:  Difficulty breathing or shortness of breath  Pain or pressure in the chest or abdomen  Sudden dizziness  Confusion  Severe or persistent vomiting  High fever that is not  responding to Tylenol (or store brand equivalent)  Decreased or no movement of your baby  [email or web address]

## 2018-11-07 NOTE — Progress Notes (Signed)
HIGH-RISK PREGNANCY VISIT Patient name: Stephanie Cook MRN [id number]  Date of birth: 04-Apr-1986 Chief Complaint:   Routine Prenatal Visit  History of Present Illness:   Stephanie Cook is a 32 y.o. [id number] female at [redacted]w[redacted]d with an Estimated Date of Delivery: 03/09/19 being seen today for ongoing management of a high-risk pregnancy complicated by [facility]- on aldomet, h/o 33wk PTB and 36wk PTB.  Today she reports bump Rt labia x few days. Declining further Makena injections d/t pain at injection site x 2days after injections, liked IM injections much better previous pregnancy. Contractions: Not present. Vag. Bleeding: None.  Movement: Present. denies leaking of fluid.  Review of Systems:   Pertinent items are noted in HPI Denies abnormal vaginal discharge w/ itching/odor/irritation, headaches, visual changes, shortness of breath, chest pain, abdominal pain, severe nausea/vomiting, or problems with urination or bowel movements unless otherwise stated above. Pertinent History Reviewed:  Reviewed past medical,surgical, social, obstetrical and family history.  Reviewed problem list, medications and allergies. Physical Assessment:   Vitals:   11/07/18 0834  BP: 133/84  Pulse: 96  Weight: 214 lb (97.1 kg)  Body mass index is 39.14 kg/m.           Physical Examination:   General appearance: alert, well appearing, and in no distress  Mental status: alert, oriented to person, place, and time  Skin: warm & dry   Extremities: Edema: None    Cardiovascular: normal heart rate noted  Respiratory: normal respiratory effort, no distress  Abdomen: gravid, soft, non-tender  Pelvic: small erythematous slightly raised area Rt labia majora, tender to touch, does not appear or feel like a boil or HSV lesion         Fetal Status: Fetal Heart Rate (bpm): 160 Fundal Height: 26 cm Movement: Present    Fetal Surveillance Testing today: doppler   Results for orders placed or performed in visit on  11/07/18 (from the past 24 hour(s))  POC Urinalysis Dipstick OB   Collection Time: 11/07/18  8:39 AM  Result Value Ref Range   Color, UA     Clarity, UA     Glucose, UA Negative Negative   Bilirubin, UA     Ketones, UA neg    Spec Grav, UA     Blood, UA neg    pH, UA     POC,PROTEIN,UA Trace Negative, Trace, Small (1+), Moderate (2+), Large (3+), 4+   Urobilinogen, UA     Nitrite, UA neg    Leukocytes, UA Negative Negative   Appearance     Odor      Assessment & Plan:  1) High-risk pregnancy [id number] at [redacted]w[redacted]d with an Estimated Date of Delivery: 03/09/19   2) CHTN, stable on aldomet, continue ASA  3) Prev PTB @ 33 & 36wks, has been on Makena, declines today d/t pain at injection site w/ autoinjector, liked IM injections much better w/ prev pregnancy. Will contact company to see if we can get switched to IM. If not, plan vaginal prometrium.   Meds: No orders of the defined types were placed in this encounter.  Labs/procedures today: flu shot  Treatment Plan:    Growth u/s @ 28, 34, 38wks     2x/wk testing nst/sono @ 32wks or weekly BPP   Deliver @ 39wks  Reviewed: Preterm labor symptoms and general obstetric precautions including but not limited to vaginal bleeding, contractions, leaking of fluid and fetal movement were reviewed in detail with the patient.  All questions were answered.  Follow-up: Return in about 3 weeks (around 11/28/2018) for HROB.  Orders Placed This Encounter  Procedures  . Flu Vaccine QUAD 36+ mos IM  . POC Urinalysis Dipstick OB   Cheral MarkerKimberly R Jacquelynne Guedes CNM, [facility]NP-BC 11/07/2018 9:24 AM

## 2018-11-28 ENCOUNTER — Encounter: Payer: Medicaid Other | Admitting: Women's Health

## 2018-12-02 ENCOUNTER — Encounter: Payer: Medicaid Other | Admitting: Women's Health

## 2018-12-03 ENCOUNTER — Telehealth: Payer: Self-pay | Admitting: *Deleted

## 2018-12-03 NOTE — Telephone Encounter (Signed)
Patient states she has been having "really really really bad cramps" since yesterday.  She is not leaking or spotting but just having the pain. She was unable to make her appt yesterday due to transportation issues.  Has not done any heavy lifting and is drinking plenty of water.  Informed patient she could be seen this afternoon but patient stated she did not have a ride and would not be able to come.  [facility] became [facility] intense or she noticed any bleeding or leaking and push more fluids and rest. Pt verbalized understanding.

## 2018-12-04 ENCOUNTER — Encounter: Payer: Self-pay | Admitting: Obstetrics and Gynecology

## 2018-12-04 ENCOUNTER — Ambulatory Visit (INDEPENDENT_AMBULATORY_CARE_PROVIDER_SITE_OTHER): Payer: Medicaid Other | Admitting: Obstetrics and Gynecology

## 2018-12-04 VITALS — BP 135/90 | HR 87 | Wt 217.6 lb

## 2018-12-04 DIAGNOSIS — R809 Proteinuria, unspecified: Secondary | ICD-10-CM

## 2018-12-04 DIAGNOSIS — O09212 Supervision of pregnancy with history of pre-term labor, second trimester: Secondary | ICD-10-CM

## 2018-12-04 DIAGNOSIS — O09892 Supervision of other high risk pregnancies, second trimester: Secondary | ICD-10-CM

## 2018-12-04 DIAGNOSIS — Z331 Pregnant state, incidental: Secondary | ICD-10-CM

## 2018-12-04 DIAGNOSIS — O0992 Supervision of high risk pregnancy, unspecified, second trimester: Secondary | ICD-10-CM

## 2018-12-04 DIAGNOSIS — O0990 Supervision of high risk pregnancy, unspecified, unspecified trimester: Secondary | ICD-10-CM

## 2018-12-04 DIAGNOSIS — O10912 Unspecified pre-existing hypertension complicating pregnancy, second trimester: Secondary | ICD-10-CM

## 2018-12-04 DIAGNOSIS — Z3A26 26 weeks gestation of pregnancy: Secondary | ICD-10-CM

## 2018-12-04 DIAGNOSIS — Z1389 Encounter for screening for other disorder: Secondary | ICD-10-CM

## 2018-12-04 LAB — POCT URINALYSIS DIPSTICK OB
Blood, UA: NEGATIVE
GLUCOSE, UA: NEGATIVE
KETONES UA: NEGATIVE
NITRITE UA: NEGATIVE

## 2018-12-04 NOTE — Addendum Note (Signed)
Addended by: Colen DarlingYOUNG, Caeden Foots S on: 12/04/2018 03:53 PM   Modules accepted: Orders

## 2018-12-04 NOTE — Progress Notes (Signed)
Patient ID: Stephanie Cook, female   DOB: [birth year], 32 y.o.   MRN: [id number]    HIGH-RISK PREGNANCY VISIT Patient name: Stephanie Cook MRN [id number]  Date of birth: [birth year] Chief Complaint: pain in RLQ x 3 d off & on.  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 [phone] female at 8449w3d with an Estimated Date of Delivery: 03/09/19 being seen today for ongoing management of a high-risk pregnancy complicated by chronic HTN. She has hx pre-term delivery at 33 weeks and another at 36w 3d. This is her 5th child and would like to get her tubes tied after delivery. Today she reports contractions., or at least a sharp discomfort in the RLQ  One spot in RLQ pain, began 3 days. First day pain would come and go. Today has been constant sharp pain, is worse when she moves around. She is accompanied by her partner Contractions: Regular. Vag. Bleeding: None.  Movement: Present. denies leaking of fluid.  Review of Systems:   Pertinent items are noted in HPI Denies abnormal vaginal discharge w/ itching/odor/irritation, headaches, visual changes, shortness of breath, chest pain, abdominal pain, severe nausea/vomiting, or problems with urination or bowel movements unless otherwise stated above. Pertinent History Reviewed:  Reviewed past medical,surgical, social, obstetrical and family history.  Reviewed problem list, medications and allergies. Physical Assessment:   Vitals:   12/04/18 1426  BP: 135/90  Pulse: 87  Weight: 217 lb 9.6 oz (98.7 kg)  Body mass index is 39.8 kg/m.           Physical Examination:   General appearance: alert, well appearing, and in some discomfort and oriented to person, place, and time  Mental status: alert, oriented to person, place, and time, normal mood, behavior, speech, dress, motor activity, and thought processes, affect appropriate to mood  Skin: warm & dry   Extremities: Edema: Trace    Cardiovascular:  normal heart rate noted  Respiratory: normal respiratory effort, no distress  Abdomen: gravid, soft, non-tender  Pelvic: Cervical exam performed normal secretions, long and closed        Fetal Status: Fetal Heart Rate (bpm): 152 Fundal Height: 27 cm Movement: Present    Fetal Surveillance Testing today: fFN   Results for orders placed or performed in visit on 12/04/18 (from the past 24 hour(s))  POC Urinalysis Dipstick OB   Collection Time: 12/04/18  2:27 PM  Result Value Ref Range   Color, UA     Clarity, UA     Glucose, UA Negative Negative   Bilirubin, UA     Ketones, UA neg    Spec Grav, UA     Blood, UA neg    pH, UA     POC,PROTEIN,UA Trace Negative, Trace, Small (1+), Moderate (2+), Large (3+), 4+   Urobilinogen, UA     Nitrite, UA neg    Leukocytes, UA Moderate (2+) (A) Negative   Appearance     Odor      Assessment & Plan:  1) High-risk pregnancy [id number] at 1349w3d with an Estimated Date of Delivery: 03/09/19   2) CHTN, stable   Meds: No orders of the defined types were placed in this encounter.   Labs/procedures today: FFN  Treatment Plan:   1. Urine C&S 2. Will call with results; keep planned appts  Follow-up: No follow-ups on file.  Orders Placed This Encounter  Procedures  . POC Urinalysis Dipstick OB   By signing  my name below, I, Arnette Norris, attest that this documentation has been prepared under the direction and in the presence of Tilda Burrow, MD. Electronically Signed: Arnette Norris Medical Scribe. 12/04/18. 3:11 PM.  I personally performed the services described in this documentation, which was SCRIBED in my presence. The recorded information has been reviewed and considered accurate. It has been edited as necessary during review. Tilda Burrow, MD

## 2018-12-05 LAB — FETAL FIBRONECTIN: Fetal Fibronectin: NEGATIVE

## 2018-12-06 LAB — URINE CULTURE: Organism ID, Bacteria: NO GROWTH

## 2018-12-17 ENCOUNTER — Other Ambulatory Visit: Payer: Self-pay | Admitting: Obstetrics and Gynecology

## 2018-12-17 DIAGNOSIS — O10919 Unspecified pre-existing hypertension complicating pregnancy, unspecified trimester: Secondary | ICD-10-CM

## 2018-12-18 NOTE — L&D Delivery Note (Addendum)
Delivery Note At 4:59 AM a viable female was delivered via Vaginal, Spontaneous (Presentation: OA).  APGAR: 9, 9; weight pending.   Placenta status: intact, cord avulsed, manually removed, appears intact.  Blood clot noted on edge of placenta. 40 units of pitocin diluted in 1000cc LR was infused rapidly IV.   Cord: 3 vessels with the following complications: avulsion.  Cord pH: N/A  Anesthesia:  epidural Episiotomy: None Lacerations: 1st degree;Periurethral Suture Repair: none Est. Blood Loss (mL):  153  Mom to postpartum.  Baby to NICU.  Standley Brooking 01/24/2019, 5:26 AM   The above was performed under my direct supervision and guidance.

## 2018-12-19 ENCOUNTER — Ambulatory Visit (INDEPENDENT_AMBULATORY_CARE_PROVIDER_SITE_OTHER): Payer: Medicaid Other | Admitting: Advanced Practice Midwife

## 2018-12-19 ENCOUNTER — Other Ambulatory Visit: Payer: Medicaid Other

## 2018-12-19 ENCOUNTER — Ambulatory Visit (INDEPENDENT_AMBULATORY_CARE_PROVIDER_SITE_OTHER): Payer: Medicaid Other

## 2018-12-19 ENCOUNTER — Encounter: Payer: Self-pay | Admitting: Advanced Practice Midwife

## 2018-12-19 VITALS — BP 134/91 | HR 102 | Wt 216.0 lb

## 2018-12-19 DIAGNOSIS — O10919 Unspecified pre-existing hypertension complicating pregnancy, unspecified trimester: Secondary | ICD-10-CM | POA: Diagnosis not present

## 2018-12-19 DIAGNOSIS — O10913 Unspecified pre-existing hypertension complicating pregnancy, third trimester: Secondary | ICD-10-CM

## 2018-12-19 DIAGNOSIS — Z23 Encounter for immunization: Secondary | ICD-10-CM | POA: Diagnosis not present

## 2018-12-19 DIAGNOSIS — Z3A28 28 weeks gestation of pregnancy: Secondary | ICD-10-CM

## 2018-12-19 DIAGNOSIS — O0990 Supervision of high risk pregnancy, unspecified, unspecified trimester: Secondary | ICD-10-CM

## 2018-12-19 DIAGNOSIS — O0993 Supervision of high risk pregnancy, unspecified, third trimester: Secondary | ICD-10-CM

## 2018-12-19 NOTE — Progress Notes (Signed)
US 28+4 wks,cephalic,cx 4.2 cm,posterior placenta gr 0,normal ovaries bilat,afi 9 cm,fhr 137 bpm,EFW 1449 g 79%,AC 91%

## 2018-12-19 NOTE — Progress Notes (Signed)
HIGH-RISK PREGNANCY VISIT Patient name: Stephanie Cook MRN [id number]  Date of birth: 01/18/1986 Chief Complaint:   High Risk Gestation (PN2/ u/s)  History of Present Illness:   Stephanie Cook is a 33 y.o. [id number] female at [redacted]w[redacted]d with an Estimated Date of Delivery: 03/09/19 being seen today for ongoing management of a high-risk pregnancy complicated by chronic HTN. She has hx pre-term delivery at 33 weeks and another at 36w 3d.. (STOPPED 17P @ 22 weeks d/t pain)  DECLINES VAGINAL PROGESTERONE Today she reports no complaints. Contractions: Irregular.  .  Movement: Present. denies leaking of fluid.  Review of Systems:   Pertinent items are noted in HPI Denies abnormal vaginal discharge w/ itching/odor/irritation, headaches, visual changes, shortness of breath, chest pain, abdominal pain, severe nausea/vomiting, or problems with urination or bowel movements unless otherwise stated above.    Pertinent History Reviewed:  Medical & Surgical Hx:   Past Medical History:  Diagnosis Date  . Depression   . Frequent urination 09/28/2015  . HSV-2 (herpes simplex virus 2) infection   . Hx of chlamydia infection    multiple times  . Hx of gonorrhea   . Hx of trichomoniasis    recurrent, 4 times  . Hypertension   . Missed period 09/28/2015  . Pregnant   . Preterm labor   . STD (female)    chlamydia, trichomonas, gonorrhea, HPV, HSV   Past Surgical History:  Procedure Laterality Date  . NO PAST SURGERIES     Family History  Problem Relation Age of Onset  . Lupus Mother   . Cirrhosis Mother   . Hypertension Maternal Grandmother     Current Outpatient Medications:  .  albuterol (PROVENTIL HFA;VENTOLIN HFA) 108 (90 Base) MCG/ACT inhaler, Inhale 2 puffs into the lungs every 6 (six) hours as needed for wheezing or shortness of breath., Disp: 1 Inhaler, Rfl: 2 .  aspirin EC 81 MG tablet, Take 2 tablets (162 mg total) by mouth daily., Disp: 60 tablet, Rfl: 6 .  methyldopa (ALDOMET) 250  MG tablet, Take 1 tablet (250 mg total) by mouth 2 (two) times daily., Disp: 60 tablet, Rfl: 6 .  Prenatal MV-Min-FA-Omega-3 (PRENATAL GUMMIES/DHA & FA PO), Take by mouth., Disp: , Rfl:   Current Facility-Administered Medications:  .  HYDROXYprogesterone Caproate SOAJ 275 mg, 275 mg, Subcutaneous, Weekly, Lazaro Arms, MD, 275 mg at 10/31/18 0928 .  HYDROXYprogesterone Caproate SOAJ 275 mg, 275 mg, Subcutaneous, Once, Eure, Amaryllis Dyke, MD .  HYDROXYprogesterone Caproate SOAJ 275 mg, 275 mg, Subcutaneous, Once, Eure, Amaryllis Dyke, MD .  HYDROXYprogesterone Caproate SOAJ 275 mg, 275 mg, Subcutaneous, Once, Eure, Amaryllis Dyke, MD Social History: Reviewed -  reports that she has quit smoking. Her smoking use included cigarettes. She has a 4.00 pack-year smoking history. She has never used smokeless tobacco.   Physical Assessment:   Vitals:   12/19/18 0936  BP: (!) 134/91  Pulse: (!) 102  Weight: 216 lb (98 kg)  Body mass index is 39.51 kg/m.           Physical Examination:   General appearance: alert, well appearing, and in no distress  Mental status: alert, oriented to person, place, and time  Skin: warm & dry   Extremities: Edema: None    Cardiovascular: normal heart rate noted  Respiratory: normal respiratory effort, no distress  Abdomen: gravid, soft, non-tender  Pelvic: Cervical exam deferred         Fetal Status:     Movement: Present  Fetal Surveillance Testing today: [place] 28+4 wks,cephalic,cx 4.2 cm,posterior placenta gr 0,normal ovaries bilat,afi 9 cm,fhr 137 bpm,EFW 1449 g 79%,AC 91%  No results found for this or any previous visit (from the past 24 hour(s)).  Assessment & Plan:  1) High-risk pregnancy [id number] at [redacted]w[redacted]d with an Estimated Date of Delivery: 03/09/19   2) CHTN, stable.  Treatment Plan:ASA, aldomet   Growth u/s @ 28, 34, 38wks   @ 32wks weekly BPP   Deliver @ 39wks  3) PTD x2, .  Treatment Plan:  Refused vag pg and Makena  Labs/procedures today: PN2  Follow-up:  Return in about 2 weeks (around 01/02/2019) for HROB.  No future appointments.  No orders of the defined types were placed in this encounter.  Jacklyn Shell CNM 12/19/2018 10:32 AM

## 2018-12-19 NOTE — Patient Instructions (Signed)
Stephanie Cook, I greatly value your feedback.  If you receive a survey following your visit with [place] today, we appreciate you taking the time to fill it out.  Thanks, Stephanie Cook, CNM   Call the office [phone]) or go to [facility] if:  You begin to have strong, frequent contractions  Your water breaks.  Sometimes it is a big gush of fluid, sometimes it is just a trickle that keeps getting your panties wet or running down your legs  You have vaginal bleeding.  It is normal to have a small amount of spotting if your cervix was checked.   You don't feel your baby moving like normal.  If you don't, get you something to eat and drink and lay down and focus on feeling your baby move.  You should feel at least 10 movements in 2 hours.  If you don't, you should call the office or go to [facility].    Tdap Vaccine  It is recommended that you get the Tdap vaccine during the third trimester of EACH pregnancy to help protect your baby from getting pertussis (whooping cough)  27-36 weeks is the BEST time to do this so that you can pass the protection on to your baby. During pregnancy is better than after pregnancy, but if you are unable to get it during pregnancy it will be offered at the hospital.   You will be offered this vaccine in the office after 27 weeks. If you do not have health insurance, you can get this vaccine at the health department or your family doctor  Everyone who will be around your baby should also be up-to-date on their vaccines. Adults (who are not pregnant) only need 1 dose of Tdap during adulthood.   Third Trimester of Pregnancy The third trimester is from week 29 through week 42, months 7 through 9. The third trimester is a time when the fetus is growing rapidly. At the end of the ninth month, the fetus is about 20 inches in length and weighs 6-10 pounds.  BODY CHANGES Your body goes through many changes during pregnancy. The changes vary from  woman to woman.   Your weight will continue to increase. You can expect to gain 25-35 pounds (11-16 kg) by the end of the pregnancy.  You may begin to get stretch marks on your hips, abdomen, and breasts.  You may urinate more often because the fetus is moving lower into your pelvis and pressing on your bladder.  You may develop or continue to have heartburn as a result of your pregnancy.  You may develop constipation because certain hormones are causing the muscles that push waste through your intestines to slow down.  You may develop hemorrhoids or swollen, bulging veins (varicose veins).  You may have pelvic pain because of the weight gain and pregnancy hormones relaxing your joints between the bones in your pelvis. Backaches may result from overexertion of the muscles supporting your posture.  You may have changes in your hair. These can include thickening of your hair, rapid growth, and changes in texture. Some women also have hair loss during or after pregnancy, or hair that feels dry or thin. Your hair will most likely return to normal after your baby is born.  Your breasts will continue to grow and be tender. A yellow discharge may leak from your breasts called colostrum.  Your belly button may stick out.  You may feel short of breath because of your expanding uterus.  You may notice the fetus "dropping," or moving lower in your abdomen.  You may have a bloody mucus discharge. This usually occurs a few days to a week before labor begins.  Your cervix becomes thin and soft (effaced) near your due date. WHAT TO EXPECT AT YOUR PRENATAL EXAMS  You will have prenatal exams every 2 weeks until week 36. Then, you will have weekly prenatal exams. During a routine prenatal visit:  You will be weighed to make sure you and the fetus are growing normally.  Your blood pressure is taken.  Your abdomen will be measured to track your baby's growth.  The fetal heartbeat will be listened  to.  Any test results from the previous visit will be discussed.  You may have a cervical check near your due date to see if you have effaced. At around 36 weeks, your caregiver will check your cervix. At the same time, your caregiver will also perform a test on the secretions of the vaginal tissue. This test is to determine if a type of bacteria, Group B streptococcus, is present. Your caregiver will explain this further. Your caregiver may ask you:  What your birth plan is.  How you are feeling.  If you are feeling the baby move.  If you have had any abnormal symptoms, such as leaking fluid, bleeding, severe headaches, or abdominal cramping.  If you have any questions. Other tests or screenings that may be performed during your third trimester include:  Blood tests that check for low iron levels (anemia).  Fetal testing to check the health, activity level, and growth of the fetus. Testing is done if you have certain medical conditions or if there are problems during the pregnancy. FALSE LABOR You may feel small, irregular contractions that eventually go away. These are called Braxton Hicks contractions, or false labor. Contractions may last for hours, days, or even weeks before true labor sets in. If contractions come at regular intervals, intensify, or become painful, it is best to be seen by your caregiver.  SIGNS OF LABOR   Menstrual-like cramps.  Contractions that are 5 minutes apart or less.  Contractions that start on the top of the uterus and spread down to the lower abdomen and back.  A sense of increased pelvic pressure or back pain.  A watery or bloody mucus discharge that comes from the vagina. If you have any of these signs before the 37th week of pregnancy, call your caregiver right away. You need to go to the hospital to get checked immediately. HOME CARE INSTRUCTIONS   Avoid all smoking, herbs, alcohol, and unprescribed drugs. These chemicals affect the  formation and growth of the baby.  Follow your caregiver's instructions regarding medicine use. There are medicines that are either safe or unsafe to take during pregnancy.  Exercise only as directed by your caregiver. Experiencing uterine cramps is a good sign to stop exercising.  Continue to eat regular, healthy meals.  Wear a good support bra for breast tenderness.  Do not use hot tubs, steam rooms, or saunas.  Wear your seat belt at all times when driving.  Avoid raw meat, uncooked cheese, cat litter boxes, and soil used by cats. These carry germs that can cause birth defects in the baby.  Take your prenatal vitamins.  Try taking a stool softener (if your caregiver approves) if you develop constipation. Eat more high-fiber foods, such as fresh vegetables or fruit and whole grains. Drink plenty of fluids to keep your urine  clear or pale yellow.  Take warm sitz baths to soothe any pain or discomfort caused by hemorrhoids. Use hemorrhoid cream if your caregiver approves.  If you develop varicose veins, wear support hose. Elevate your feet for 15 minutes, 3-4 times a day. Limit salt in your diet.  Avoid heavy lifting, wear low heal shoes, and practice good posture.  Rest a lot with your legs elevated if you have leg cramps or low back pain.  Visit your dentist if you have not gone during your pregnancy. Use a soft toothbrush to brush your teeth and be gentle when you floss.  A sexual relationship may be continued unless your caregiver directs you otherwise.  Do not travel far distances unless it is absolutely necessary and only with the approval of your caregiver.  Take prenatal classes to understand, practice, and ask questions about the labor and delivery.  Make a trial run to the hospital.  Pack your hospital bag.  Prepare the baby's nursery.  Continue to go to all your prenatal visits as directed by your caregiver. SEEK MEDICAL CARE IF:  You are unsure if you are in  labor or if your water has broken.  You have dizziness.  You have mild pelvic cramps, pelvic pressure, or nagging pain in your abdominal area.  You have persistent nausea, vomiting, or diarrhea.  You have a bad smelling vaginal discharge.  You have pain with urination. SEEK IMMEDIATE MEDICAL CARE IF:   You have a fever.  You are leaking fluid from your vagina.  You have spotting or bleeding from your vagina.  You have severe abdominal cramping or pain.  You have rapid weight loss or gain.  You have shortness of breath with chest pain.  You notice sudden or extreme swelling of your face, hands, ankles, feet, or legs.  You have not felt your baby move in over an hour.  You have severe headaches that do not go away with medicine.  You have vision changes. Document Released: 11/28/2001 Document Revised: 12/09/2013 Document Reviewed: 02/04/2013 [facility] Patient Information 2015 [place], [place]. This information is not intended to replace advice given to you by your health care provider. Make sure you discuss any questions you have with your health care provider.

## 2018-12-20 LAB — GLUCOSE TOLERANCE, 2 HOURS W/ 1HR
GLUCOSE, 1 HOUR: 189 mg/dL — AB (ref 65–179)
GLUCOSE, 2 HOUR: 130 mg/dL (ref 65–152)
GLUCOSE, FASTING: 82 mg/dL (ref 65–91)

## 2018-12-20 LAB — CBC
HEMATOCRIT: 33.5 % — AB (ref 34.0–46.6)
Hemoglobin: 11.1 g/dL (ref 11.1–15.9)
MCH: 28.1 pg (ref 26.6–33.0)
MCHC: 33.1 g/dL (ref 31.5–35.7)
MCV: 85 fL (ref 79–97)
PLATELETS: 244 10*3/uL (ref 150–450)
RBC: 3.95 x10E6/uL (ref 3.77–5.28)
RDW: 12.4 % (ref 12.3–15.4)
WBC: 10.6 10*3/uL (ref 3.4–10.8)

## 2018-12-20 LAB — ANTIBODY SCREEN: ANTIBODY SCREEN: NEGATIVE

## 2018-12-20 LAB — HIV ANTIBODY (ROUTINE TESTING W REFLEX): HIV Screen 4th Generation wRfx: NONREACTIVE

## 2018-12-20 LAB — RPR: RPR Ser Ql: NONREACTIVE

## 2019-01-02 ENCOUNTER — Encounter: Payer: Medicaid Other | Admitting: Family Medicine

## 2019-01-02 ENCOUNTER — Encounter: Payer: Self-pay | Admitting: Obstetrics & Gynecology

## 2019-01-02 ENCOUNTER — Ambulatory Visit (INDEPENDENT_AMBULATORY_CARE_PROVIDER_SITE_OTHER): Payer: Medicaid Other | Admitting: Obstetrics & Gynecology

## 2019-01-02 ENCOUNTER — Other Ambulatory Visit: Payer: Self-pay

## 2019-01-02 VITALS — BP 128/82 | HR 109 | Wt 218.0 lb

## 2019-01-02 DIAGNOSIS — O10913 Unspecified pre-existing hypertension complicating pregnancy, third trimester: Secondary | ICD-10-CM

## 2019-01-02 DIAGNOSIS — O10919 Unspecified pre-existing hypertension complicating pregnancy, unspecified trimester: Secondary | ICD-10-CM

## 2019-01-02 DIAGNOSIS — Z3A30 30 weeks gestation of pregnancy: Secondary | ICD-10-CM

## 2019-01-02 DIAGNOSIS — Z1389 Encounter for screening for other disorder: Secondary | ICD-10-CM

## 2019-01-02 DIAGNOSIS — O0993 Supervision of high risk pregnancy, unspecified, third trimester: Secondary | ICD-10-CM

## 2019-01-02 DIAGNOSIS — Z331 Pregnant state, incidental: Secondary | ICD-10-CM

## 2019-01-02 LAB — POCT URINALYSIS DIPSTICK OB
Blood, UA: NEGATIVE
Glucose, UA: NEGATIVE
KETONES UA: NEGATIVE
Nitrite, UA: NEGATIVE
PROTEIN: NEGATIVE

## 2019-01-02 NOTE — Progress Notes (Signed)
   HIGH-RISK PREGNANCY VISIT Patient name: Stephanie Cook MRN [id number]  Date of birth: [birth year] Chief Complaint:   High Risk Gestation  History of Present Illness:   Stephanie Cook is a 33 y.o. [id number] female at [redacted]w[redacted]d with an Estimated Date of Delivery: 03/09/19 being seen today for ongoing management of a high-risk pregnancy complicated by chronic HTN.  Today she reports no complaints. Contractions: Irregular. Vag. Bleeding: None.  Movement: Present. denies leaking of fluid.  Review of Systems:   Pertinent items are noted in HPI Denies abnormal vaginal discharge w/ itching/odor/irritation, headaches, visual changes, shortness of breath, chest pain, abdominal pain, severe nausea/vomiting, or problems with urination or bowel movements unless otherwise stated above. Pertinent History Reviewed:  Reviewed past medical,surgical, social, obstetrical and family history.  Reviewed problem list, medications and allergies. Physical Assessment:   Vitals:   01/02/19 1500  BP: 128/82  Pulse: (!) 109  Weight: 218 lb (98.9 kg)  Body mass index is 39.87 kg/m.           Physical Examination:   General appearance: alert, well appearing, and in no distress  Mental status: alert, oriented to person, place, and time  Skin: warm & dry   Extremities: Edema: Trace    Cardiovascular: normal heart rate noted  Respiratory: normal respiratory effort, no distress  Abdomen: gravid, soft, non-tender  Pelvic: Cervical exam deferred         Fetal Status: Fetal Heart Rate (bpm): 145 Fundal Height: 32 cm Movement: Present    Fetal Surveillance Testing today:    Results for orders placed or performed in visit on 01/02/19 (from the past 24 hour(s))  POC Urinalysis Dipstick OB   Collection Time: 01/02/19  3:01 PM  Result Value Ref Range   Color, UA     Clarity, UA     Glucose, UA Negative Negative   Bilirubin, UA     Ketones, UA neg    Spec Grav, UA     Blood, UA neg    pH, UA     POC,PROTEIN,UA Negative Negative, Trace, Small (1+), Moderate (2+), Large (3+), 4+   Urobilinogen, UA     Nitrite, UA neg    Leukocytes, UA Moderate (2+) (A) Negative   Appearance f    Odor      Assessment & Plan:  1) High-risk pregnancy [id number] at [redacted]w[redacted]d with an Estimated Date of Delivery: 03/09/19   2) CHTN, stable, on aldomet 250 BID + baby ASA  ,   Meds: No orders of the defined types were placed in this encounter.   Labs/procedures today:   Treatment Plan:  Begin twice weekly surveillance 32 weeks IOL 39 or as indicated  Reviewed: Preterm labor symptoms and general obstetric precautions including but not limited to vaginal bleeding, contractions, leaking of fluid and fetal movement were reviewed in detail with the patient.  All questions were answered.  Follow-up: Return in about 2 weeks (around 01/16/2019) for BPP/sono, HROB.  Orders Placed This Encounter  Procedures  . US Fetal BPP W/O Non Stress  . US OB Follow Up  . [place] UA Cord Doppler  . POC Urinalysis Dipstick OB   Lazaro Arms  01/02/2019 3:22 PM

## 2019-01-11 ENCOUNTER — Inpatient Hospital Stay (HOSPITAL_COMMUNITY)
Admission: EM | Admit: 2019-01-11 | Discharge: 2019-01-12 | Disposition: A | Discharge status: Home / Self Care | Payer: Medicaid Other | Source: Ambulatory Visit | Attending: Obstetrics & Gynecology | Admitting: Obstetrics & Gynecology

## 2019-01-11 ENCOUNTER — Encounter (HOSPITAL_COMMUNITY): Payer: Self-pay | Admitting: Emergency Medicine

## 2019-01-11 ENCOUNTER — Other Ambulatory Visit: Payer: Self-pay

## 2019-01-11 DIAGNOSIS — O479 False labor, unspecified: Secondary | ICD-10-CM

## 2019-01-11 DIAGNOSIS — O4703 False labor before 37 completed weeks of gestation, third trimester: Secondary | ICD-10-CM

## 2019-01-11 DIAGNOSIS — Z3A32 32 weeks gestation of pregnancy: Secondary | ICD-10-CM | POA: Insufficient documentation

## 2019-01-11 DIAGNOSIS — O2343 Unspecified infection of urinary tract in pregnancy, third trimester: Secondary | ICD-10-CM | POA: Diagnosis not present

## 2019-01-11 DIAGNOSIS — O4700 False labor before 37 completed weeks of gestation, unspecified trimester: Secondary | ICD-10-CM

## 2019-01-11 DIAGNOSIS — O0990 Supervision of high risk pregnancy, unspecified, unspecified trimester: Secondary | ICD-10-CM

## 2019-01-11 DIAGNOSIS — Z87891 Personal history of nicotine dependence: Secondary | ICD-10-CM | POA: Diagnosis not present

## 2019-01-11 LAB — URINALYSIS, ROUTINE W REFLEX MICROSCOPIC
Bilirubin Urine: NEGATIVE
Glucose, UA: NEGATIVE mg/dL
Hgb urine dipstick: NEGATIVE
Ketones, ur: 20 mg/dL — AB
Nitrite: NEGATIVE
Protein, ur: NEGATIVE mg/dL
Specific Gravity, Urine: 1.02 (ref 1.005–1.030)
pH: 6 (ref 5.0–8.0)

## 2019-01-11 MED ORDER — LACTATED RINGERS IV BOLUS
500.0000 mL | Freq: Once | INTRAVENOUS | Status: AC
  Administered 2019-01-11: 500 mL via INTRAVENOUS

## 2019-01-11 MED ORDER — TERBUTALINE SULFATE 1 MG/ML IJ SOLN
0.2500 mg | Freq: Once | INTRAMUSCULAR | Status: AC
  Administered 2019-01-11: 0.25 mg via SUBCUTANEOUS
  Filled 2019-01-11: qty 1

## 2019-01-11 MED ORDER — LACTATED RINGERS IV BOLUS
1000.0000 mL | Freq: Once | INTRAVENOUS | Status: AC
  Administered 2019-01-12: 1000 mL via INTRAVENOUS

## 2019-01-11 MED ORDER — LACTATED RINGERS IV SOLN
INTRAVENOUS | Status: DC
  Administered 2019-01-11: 23:00:00 via INTRAVENOUS

## 2019-01-11 NOTE — ED Triage Notes (Signed)
Patient reports onset of contractions at 1842 today, small amount of fluid leak. No bleeding. Last contraction 12 minutes ago. Vary from 1 min to 2 min.

## 2019-01-11 NOTE — Progress Notes (Signed)
RROB SPOKE WITH LORI, RN- PT'S RN; RN REPORTS THAT PT SAYS UC'S HAVE SPACED OUT/SLOWED DOWN; RN SAID SHE WILL COLLECT URINE SOON FOR U/A; RROB ASKED IF PROVIDER CAN CHECK CERVIX, RN WILL CALL RROB BACK WITH EXAM

## 2019-01-11 NOTE — Progress Notes (Signed)
RROB NOTIFIED OF PT AT [facility] WITH C/O UC'S APART SINCE 1830; NO VAGINAL BLEEDING; SOME TRICKLING, PT UNSURE IF IT IS URINE; POSITIVE FETAL MOVEMENT; PT REPORTS UC'S AND TRICKLING STARTED AFTER HAVING INTERCOURSE WITH HER PARTNER; HX 2 TERM DELIVERIES AND 2 PRETERM  RROB NOTIFIED DR ARNOLD, OB ATTENDING;          POC TO CONTINUE EFM; NO ORDERS AT THIS TIME

## 2019-01-11 NOTE — Progress Notes (Signed)
PT IS STARTING TO CONTRACT AGAIN; RROB SPOKE WITH DR L.WALLACE, TOLD OF HISTORY AND U/A RESULTS; ORDERS FOR BOLUS OF LR AND CONTINUE TO MONITOR EFM; ORDERS GIVEN TO CHARGE RN @ [facility] AND  RROB SUGGESTED THAT PT LAY ON HER SIDE AS WELL

## 2019-01-11 NOTE — ED Notes (Signed)
RROB SPOKE WITH DR ARNOLD; HE REVIEWED STRIP; ORDERS FOR BOLUS OF LR, THEN TO RUN AT 125ML/HR; TERBUTALINE 0.25 SQ; AND URINALYSIS RROB SPOKE WITH CHARGE RN AND NOTIFIED OF ORDERS FOR PT; CHARGE RN READ BACK ORDERS AND PLACED ORDERS

## 2019-01-11 NOTE — ED Provider Notes (Signed)
[facility] EMERGENCY DEPARTMENT Provider Note   CSN: [id number] Arrival date & time: 01/11/19  2007     History   Chief Complaint Chief Complaint  Patient presents with  . Labor Eval    HPI Elray McgregorBrittany D Scharfenberg is a 33 y.o. female.  HPI Patient is G6 P4 there was at 32 weeks.  She is being followed by Dr. Emelda FearFerguson.  States around 6:30 this evening began having lower abdominal contractions.  States they are coming roughly every 10 to 12 minutes.  Questionable small leakage of fluid.  States she is still having very good fetal movements.  Has had no vaginal bleeding. Past Medical History:  Diagnosis Date  . Depression   . Frequent urination 09/28/2015  . HSV-2 (herpes simplex virus 2) infection   . Hx of chlamydia infection    multiple times  . Hx of gonorrhea   . Hx of trichomoniasis    recurrent, 4 times  . Hypertension   . Missed period 09/28/2015  . Pregnant   . Preterm labor   . STD (female)    chlamydia, trichomonas, gonorrhea, HPV, HSV    Patient Active Problem List   Diagnosis Date Noted  . Supervision of high risk pregnancy, antepartum 08/08/2018  . Essential hypertension, benign 08/01/2016  . Obesity 08/01/2016  . Cigarette nicotine dependence without complication 08/01/2016  . Major depressive disorder, single episode, severe, specified as with psychotic behavior 03/12/2014  . 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 Mood disorder, drug-induced (HCC) 03/11/2014  . Depression 11/25/2013  . Benign essential hypertension, antepartum 06/11/2013  . H/O preterm delivery, currently pregnant 03/18/2013  . HSV-2 infection 03/18/2013    Past Surgical History:  Procedure Laterality Date  . NO PAST SURGERIES       OB History    Gravida  6   Para  4   Term  2   Preterm  2   AB  1   Living  4     SAB  1   TAB  0   Ectopic  0   Multiple  0   Live Births  4            Home Medications     Prior to Admission medications   Medication Sig Start Date End Date Taking? Authorizing Provider  albuterol (PROVENTIL HFA;VENTOLIN HFA) 108 (90 Base) MCG/ACT inhaler Inhale 2 puffs into the lungs every 6 (six) hours as needed for wheezing or shortness of breath. 10/24/18  Yes Cresenzo-Dishmon, Scarlette CalicoFrances, CNM  aspirin EC 81 MG tablet Take 2 tablets (162 mg total) by mouth daily. 09/05/18  Yes Cresenzo-Dishmon, Scarlette CalicoFrances, CNM  methyldopa (ALDOMET) 250 MG tablet Take 1 tablet (250 mg total) by mouth 2 (two) times daily. 08/08/18  Yes Cresenzo-Dishmon, Scarlette CalicoFrances, CNM  Prenatal MV-Min-FA-Omega-3 (PRENATAL GUMMIES/DHA & FA PO) Take by mouth.   Yes [provider]  cephALEXin (KEFLEX) 500 MG capsule Take 1 capsule (500 mg total) by mouth 3 (three) times daily. 01/12/19   Leftwich-Kirby, Wilmer FloorLisa A, CNM  NIFEdipine (PROCARDIA) 10 MG capsule Take 1 capsule (10 mg total) by mouth every 4 (four) hours as needed (contractions). 01/12/19   Leftwich-Kirby, Wilmer FloorLisa A, CNM    Family History Family History  Problem Relation Age of Onset  . Lupus Mother   . Cirrhosis Mother   . Hypertension Maternal Grandmother     Social History Social History   Tobacco Use  . Smoking status: Former Smoker  Packs/day: 0.50    Years: 8.00    Pack years: 4.00    Types: Cigarettes  . Smokeless tobacco: Never Used  Substance Use Topics  . Alcohol use: Not Currently    Comment: occasionally  . Drug use: Not Currently    Types: Marijuana     Allergies   Motrin [ibuprofen]   Review of Systems Review of Systems  Constitutional: Negative for chills and fever.  Respiratory: Negative for cough and shortness of breath.   Cardiovascular: Negative for chest pain.  Gastrointestinal: Positive for abdominal pain. Negative for diarrhea, nausea and vomiting.  Genitourinary: Negative for dysuria, flank pain and frequency.  Musculoskeletal: Negative for back pain, myalgias and neck pain.  Skin: Negative for rash and wound.   Neurological: Negative for dizziness, weakness, light-headedness, numbness and headaches.  All other systems reviewed and are negative.    Physical Exam Updated Vital Signs BP 133/81 (BP Location: Right Arm)   Pulse 98   Temp 98.5 F (36.9 C)   Resp 20   Ht 5\' 2"  (1.575 m)   Wt 98.9 kg   LMP 06/02/2018   SpO2 97%   BMI 39.87 kg/m   Physical Exam Vitals signs and nursing note reviewed.  Constitutional:      Appearance: Normal appearance. She is well-developed.  HENT:     Head: Normocephalic and atraumatic.  Eyes:     Pupils: Pupils are equal, round, and reactive to light.  Neck:     Musculoskeletal: Normal range of motion and neck supple.  Cardiovascular:     Rate and Rhythm: Normal rate and regular rhythm.  Pulmonary:     Effort: Pulmonary effort is normal.     Breath sounds: Normal breath sounds.  Abdominal:     General: Bowel sounds are normal.     Palpations: Abdomen is soft.     Tenderness: There is no abdominal tenderness. There is no guarding or rebound.     Comments: Gravid abdomen.  Nontender.  Musculoskeletal: Normal range of motion.        General: No tenderness.  Skin:    General: Skin is warm and dry.     Capillary Refill: Capillary refill takes less than 2 seconds.     Findings: No erythema or rash.  Neurological:     General: No focal deficit present.     Mental Status: She is alert and oriented to person, place, and time.  Psychiatric:        Mood and Affect: Mood normal.        Behavior: Behavior normal.      ED Treatments / Results  Labs (all labs ordered are listed, but only abnormal results are displayed) Labs Reviewed  URINE CULTURE - Abnormal; Notable for the following components:      Result Value   Culture   (*)    Value: 20,000 COLONIES/mL PROTEUS MIRABILIS SUSCEPTIBILITIES TO FOLLOW Performed at [facility], [address] [place], [place] [address]    All other components within normal limits  URINALYSIS, ROUTINE W  REFLEX MICROSCOPIC - Abnormal; Notable for the following components:   APPearance HAZY (*)    Ketones, ur 20 (*)    Leukocytes, UA LARGE (*)    Bacteria, UA RARE (*)    All other components within normal limits    EKG None  Radiology No results found.  Procedures Procedures (including critical care time)  Medications Ordered in ED Medications  lactated ringers bolus 500 mL (0  mLs Intravenous Stopped 01/11/19 2215)  terbutaline (BRETHINE) injection 0.25 mg (0.25 mg Subcutaneous Given 01/11/19 2140)  lactated ringers bolus 1,000 mL (0 mLs Intravenous Stopped 01/12/19 0125)  cephALEXin (KEFLEX) capsule 500 mg (500 mg Oral Given 01/12/19 0052)     Initial Impression / Assessment and Plan / ED Course  I have reviewed the triage vital signs and the nursing notes.  Pertinent labs & imaging results that were available during my care of the patient were reviewed by me and considered in my medical decision making (see chart for details).    Placed on tocometer.  Good fetal heart tones.  Per Dr. Debroah LoopArnold, recommends a bolus of 500 mils of lactated Ringer's solution and then running at 125 mils an hour.  Also recommend UA and giving 0.25 mg of terbutaline subcutaneously.  We will continue to monitor closely.  Contractions have slowed.  Sterile glove exam. 2/thick/high.  We will continue to monitor. And out to oncoming emergency physician pending completion of observation by OB. Final Clinical Impressions(s) / ED Diagnoses   Final diagnoses:  Premature uterine contractions  Threatened preterm labor, third trimester  UTI (urinary tract infection) in pregnancy in third trimester    ED Discharge Orders         Ordered    NIFEdipine (PROCARDIA) 10 MG capsule  Every 4 hours PRN     01/12/19 0343    cephALEXin (KEFLEX) 500 MG capsule  3 times daily     01/12/19 0343    Discharge patient     01/12/19 0343           Loren RacerYelverton, Doll Frazee, MD 01/13/19 2041

## 2019-01-11 NOTE — ED Notes (Signed)
This note also relates to the following rows which could not be included: Pulse Rate - Cannot attach notes to unvalidated device data [facility] SPOKE WITH PT'S RN AND ED PROVIDER; [facility] WILL CONSULT WITH DR ARNOLD AFTER UA IS RESULTED

## 2019-01-12 ENCOUNTER — Encounter (HOSPITAL_COMMUNITY): Payer: Self-pay | Admitting: *Deleted

## 2019-01-12 DIAGNOSIS — Z87891 Personal history of nicotine dependence: Secondary | ICD-10-CM | POA: Diagnosis not present

## 2019-01-12 DIAGNOSIS — Z3A32 32 weeks gestation of pregnancy: Secondary | ICD-10-CM

## 2019-01-12 DIAGNOSIS — O4703 False labor before 37 completed weeks of gestation, third trimester: Secondary | ICD-10-CM

## 2019-01-12 DIAGNOSIS — O2343 Unspecified infection of urinary tract in pregnancy, third trimester: Secondary | ICD-10-CM

## 2019-01-12 MED ORDER — NIFEDIPINE 10 MG PO CAPS: 10.0000 mg | ORAL_CAPSULE | ORAL | 0 refills | Status: DC | PRN

## 2019-01-12 MED ORDER — NIFEDIPINE 10 MG PO CAPS
10.0000 mg | ORAL_CAPSULE | ORAL | Status: DC | PRN
  Administered 2019-01-12: 10 mg via ORAL
  Filled 2019-01-12: qty 1

## 2019-01-12 MED ORDER — CEPHALEXIN 500 MG PO CAPS: 500.0000 mg | ORAL_CAPSULE | Freq: Three times a day (TID) | ORAL | 0 refills | Status: DC

## 2019-01-12 MED ORDER — CEPHALEXIN 500 MG PO CAPS
500.0000 mg | ORAL_CAPSULE | Freq: Once | ORAL | Status: AC
  Administered 2019-01-12: 500 mg via ORAL
  Filled 2019-01-12: qty 1

## 2019-01-12 NOTE — Progress Notes (Signed)
[facility] CALLED TO CHECK ON PT; FHR REACTIVE AND PT HAVING UTERINE IRRITABILITY/ 1 UC SEEN [facility] CHARGE RN TOLD [facility] THAT PT CONTINUED TO BE UNCOMFORTABLE, ED PROVIDER AND OB SPOKE; DECISION TO TRANSFER PT TO MAU FOR FURTHER MONITORING AND ASSESSMENT

## 2019-01-12 NOTE — Progress Notes (Signed)
Lisa Leftwich Kirby CNM in earlier to discuss d/c plan. Written and verbal d/c instructions given and understanding voiced. 

## 2019-01-12 NOTE — MAU Provider Note (Signed)
Chief Complaint:  Labor Eval   First Provider Initiated Contact with Patient 01/12/19 0247      HPI: Elray McgregorBrittany D Crader is a 33 y.o. [id number] at 4430w0d by LMP who presents to maternity admissions sent from [facility] ED for contractions. She presented today for cramping/contractions starting today that ranged from 8 minutes to 12 minutes apart. Some are stronger than others, in her low abdomen, intermittent, and radiating to her low back.  There are no other associated symptoms.  She had terbutaline and IV fluids in the ED which helped but contractions returned.  She has not tried any other treatments. She reports good fetal movement.  HPI  Past Medical History: Past Medical History:  Diagnosis Date  . Depression   . Frequent urination 09/28/2015  . HSV-2 (herpes simplex virus 2) infection   . Hx of chlamydia infection    multiple times  . Hx of gonorrhea   . Hx of trichomoniasis    recurrent, 4 times  . Hypertension   . Missed period 09/28/2015  . Pregnant   . Preterm labor   . STD (female)    chlamydia, trichomonas, gonorrhea, HPV, HSV    Past obstetric history: OB History  Gravida Para Term Preterm AB Living  6 4 2 2 1 4   SAB TAB Ectopic Multiple Live Births  1 0 0 0 4    # Outcome Date GA Lbr Len/2nd Weight Sex Delivery Anes PTL Lv  6 Current           5 Preterm 10/26/13 7971w3d 20:36 / 00:13 2870 g M Vag-Spont EPI N LIV     Birth Comments: Pregnancy complications: +HSV 2 on acyclovir, MJ use, Etoh use, on 17P (previous preterm delivery), smoker, hypertension on aldomet, h/o multiple chlaymdia infections, h/o GC. Depression.  4 SAB 12/2012 2431w0d            Birth Comments: System Generated. Please review and update pregnancy details.  3 Preterm 05/04/12 3011w0d  2211 g F Vag-Spont None Y LIV     Birth Comments: System Generated. Please review and update pregnancy details.  2 Term 07/25/09 12331w0d 03:30 2920 g F Vag-Spont EPI N LIV  1 Term 02/01/08 52331w0d 11:00 3033 g M  Vag-Spont EPI N LIV    Past Surgical History: Past Surgical History:  Procedure Laterality Date  . NO PAST SURGERIES      Family History: Family History  Problem Relation Age of Onset  . Lupus Mother   . Cirrhosis Mother   . Hypertension Maternal Grandmother     Social History: Social History   Tobacco Use  . Smoking status: Former Smoker    Packs/day: 0.50    Years: 8.00    Pack years: 4.00    Types: Cigarettes  . Smokeless tobacco: Never Used  Substance Use Topics  . Alcohol use: Not Currently    Comment: occasionally  . Drug use: Not Currently    Types: Marijuana    Allergies:  Allergies  Allergen Reactions  . Motrin [Ibuprofen] Hives    Meds:  Facility-Administered Medications Prior to Admission  Medication Dose Route Frequency Provider Last Rate Last Dose  . HYDROXYprogesterone Caproate SOAJ 275 mg  275 mg Subcutaneous Weekly Lazaro ArmsEure, Luther H, MD   275 mg at 10/31/18 [id number]  . HYDROXYprogesterone Caproate SOAJ 275 mg  275 mg Subcutaneous Once Lazaro ArmsEure, Luther H, MD      . HYDROXYprogesterone Caproate SOAJ 275 mg  275 mg Subcutaneous Once Lazaro ArmsEure, Luther H,  MD      . HYDROXYprogesterone Caproate SOAJ 275 mg  275 mg Subcutaneous Once Lazaro ArmsEure, Luther H, MD       Medications Prior to Admission  Medication Sig Dispense Refill Last Dose  . albuterol (PROVENTIL HFA;VENTOLIN HFA) 108 (90 Base) MCG/ACT inhaler Inhale 2 puffs into the lungs every 6 (six) hours as needed for wheezing or shortness of breath. 1 Inhaler 2 Past Week at Unknown time  . aspirin EC 81 MG tablet Take 2 tablets (162 mg total) by mouth daily. 60 tablet 6 01/11/2019 at Unknown time  . methyldopa (ALDOMET) 250 MG tablet Take 1 tablet (250 mg total) by mouth 2 (two) times daily. 60 tablet 6 01/11/2019 at Unknown time  . Prenatal MV-Min-FA-Omega-3 (PRENATAL GUMMIES/DHA & FA PO) Take by mouth.   01/11/2019 at Unknown time    ROS:  Review of Systems  Constitutional: Negative for chills, fatigue and fever.  Eyes:  Negative for visual disturbance.  Respiratory: Negative for shortness of breath.   Cardiovascular: Negative for chest pain.  Gastrointestinal: Positive for abdominal pain. Negative for nausea and vomiting.  Genitourinary: Positive for pelvic pain. Negative for difficulty urinating, dysuria, flank pain, vaginal bleeding, vaginal discharge and vaginal pain.  Neurological: Negative for dizziness and headaches.  Psychiatric/Behavioral: Negative.      I have reviewed patient's Past Medical Hx, Surgical Hx, Family Hx, Social Hx, medications and allergies.   Physical Exam   Patient Vitals for the past 24 hrs:  BP Temp Temp src Pulse Resp SpO2 Height Weight  01/12/19 0232 133/81 98.5 F (36.9 C) - 98 20 - - -  01/12/19 0054 107/60 - - (!) 118 20 97 % - -  01/12/19 0017 (!) 95/59 - - 97 20 99 % - -  01/11/19 2315 - - - 96 - - - -  01/11/19 2215 112/76 - - (!) 106 20 98 % - -  01/11/19 2200 112/76 - - (!) 113 - - - -  01/11/19 2142 109/79 - - (!) 109 20 99 % - -  01/11/19 2100 117/78 - - (!) 107 - - - -  01/11/19 2016 - - - - - - 5\' 2"  (1.575 m) 98.9 kg  01/11/19 2015 (!) 126/94 98 F (36.7 C) Oral (!) 111 (!) 24 99 % - -   Constitutional: Well-developed, well-nourished female in no acute distress.  Cardiovascular: normal rate Respiratory: normal effort GI: Abd soft, non-tender, gravid appropriate for gestational age.  MS: Extremities nontender, no edema, normal ROM Neurologic: Alert and oriented x 4.  GU: Neg CVAT.  PELVIC EXAM: Cervix pink, visually closed, without lesion, scant white creamy discharge, vaginal walls and external genitalia normal Bimanual exam: Cervix 0/long/high, firm, anterior, neg CMT, uterus nontender, nonenlarged, adnexa without tenderness, enlargement, or mass  Dilation: Closed(2 EXTERNAL OS/ 0 INTERNAL OS) Effacement (%): Thick Cervical Position: Posterior Station: -3(HIGH) Presentation: Undeterminable Exam by:: DR Ranae PalmsYELVERTON  FHT:  Baseline 145 , moderate  variability, accelerations present, no decelerations Contractions: q 2-10 mins, mild to palpation   Labs: Results for orders placed or performed during the hospital encounter of 01/11/19 (from the past 24 hour(s))  Urinalysis, Routine w reflex microscopic     Status: Abnormal   Collection Time: 01/11/19 11:00 PM  Result Value Ref Range   Color, Urine YELLOW YELLOW   APPearance HAZY (A) CLEAR   Specific Gravity, Urine 1.020 1.005 - 1.030   pH 6.0 5.0 - 8.0   Glucose, UA NEGATIVE NEGATIVE mg/dL  Hgb urine dipstick NEGATIVE NEGATIVE   Bilirubin Urine NEGATIVE NEGATIVE   Ketones, ur 20 (A) NEGATIVE mg/dL   Protein, ur NEGATIVE NEGATIVE mg/dL   Nitrite NEGATIVE NEGATIVE   Leukocytes, UA LARGE (A) NEGATIVE   RBC / HPF 11-20 0 - 5 RBC/hpf   WBC, UA 21-50 0 - 5 WBC/hpf   Bacteria, UA RARE (A) NONE SEEN   Squamous Epithelial / LPF 6-10 0 - 5   Mucus PRESENT    B/Positive/-- (08/22 1516)  Imaging:    MAU Course/MDM: Orders Placed This Encounter  Procedures  . Urine culture  . Urinalysis, Routine w reflex microscopic  . Discharge patient    Meds ordered this encounter  Medications  . lactated ringers bolus 500 mL  . terbutaline (BRETHINE) injection 0.25 mg  . lactated ringers infusion  . lactated ringers bolus 1,000 mL  . cephALEXin (KEFLEX) capsule 500 mg  . NIFEdipine (PROCARDIA) capsule 10 mg  . NIFEdipine (PROCARDIA) 10 MG capsule    Sig: Take 1 capsule (10 mg total) by mouth every 4 (four) hours as needed (contractions).    Dispense:  20 capsule    Refill:  0    Order Specific Question:   Supervising Provider    Answer:   Adam Phenix [[id number]]  . cephALEXin (KEFLEX) 500 MG capsule    Sig: Take 1 capsule (500 mg total) by mouth 3 (three) times daily.    Dispense:  21 capsule    Refill:  0    Order Specific Question:   Supervising Provider    Answer:   Adam Phenix [[id number]]     NST reviewed and reactive BP wnl today, CHTN is well controlled on Aldomet Cervix  1/thick/posterior, unchanged from previous exam  Procardia 10 mg PO x 1 dose given, pt feeing better, desires discharge Preterm labor precautions given Keflex Rx for UTI, Procardia 10 mg Q 4 hours PRN F/U on 1/30 at [facility] as scheduled Pt discharge with strict preterm labor precautions.    Assessment: 1. Premature uterine contractions   2. Supervision of high risk pregnancy, antepartum   3. Threatened preterm labor, third trimester   4. UTI (urinary tract infection) in pregnancy in third trimester     Plan: Discharge home Labor precautions and fetal kick counts Follow-up Information    Tilda Burrow, MD Follow up.   Specialties:  Obstetrics and Gynecology, Radiology Why:  Keep scheduled appt on Thursday, 01/16/19. Return to ED or MAU as needed for emergencies.  Contact information: [address] [place] [address] [phone]          Allergies as of 01/12/2019      Reactions   Motrin [ibuprofen] Hives      Medication List    TAKE these medications   albuterol 108 (90 Base) MCG/ACT inhaler Commonly known as:  PROVENTIL HFA;VENTOLIN HFA Inhale 2 puffs into the lungs every 6 (six) hours as needed for wheezing or shortness of breath.   aspirin EC 81 MG tablet Take 2 tablets (162 mg total) by mouth daily.   cephALEXin 500 MG capsule Commonly known as:  KEFLEX Take 1 capsule (500 mg total) by mouth 3 (three) times daily.   methyldopa 250 MG tablet Commonly known as:  ALDOMET Take 1 tablet (250 mg total) by mouth 2 (two) times daily.   NIFEdipine 10 MG capsule Commonly known as:  PROCARDIA Take 1 capsule (10 mg total) by mouth every 4 (four) hours as needed (  contractions).   PRENATAL GUMMIES/DHA & FA PO Take by mouth.       Sharen Counter Certified Nurse-Midwife 01/12/2019 3:56 AM

## 2019-01-12 NOTE — ED Notes (Signed)
[facility] at bedside to transport patient. 

## 2019-01-12 NOTE — MAU Note (Addendum)
Pt admitted by [facility] from [facility] with preterm ctxs. Alert and oriented. EFM applied IV L hand infusing at 125/hr. Dsg clean and dry

## 2019-01-12 NOTE — ED Provider Notes (Signed)
Patient left at change of shift, patient was being treated for premature labor.  She has gotten terbutaline and IV fluids.  [facility] for [facility].  She was last seen there on January 16.  The last OB nurse notes night state patient is starting to have contractions again.  At this point I think patient has been in the ED over 4 hours and needs to go for definitive OB care.  Patient is G6 P2-2-1-4 estimated due date March 09, 2019, about [redacted] weeks pregnant.  View of labs shows patient may have a UTI.  She was given Keflex orally.  Urine culture had already been sent.  12:30 AM I spoke to Dr. Debroah Loop, on-call at [facility] he and he has accepted in transfer.  I wanted to talk to patient and she is laying on her left side.  She was informed of the transfer.   Devoria Albe, MD 01/12/19 [phone]

## 2019-01-13 ENCOUNTER — Telehealth: Payer: Self-pay | Admitting: Obstetrics and Gynecology

## 2019-01-13 NOTE — Telephone Encounter (Signed)
Patient states she is still having contractions. Has not gotten Procardia from pharmacy.  Advised patient to get medication and take as prescribed.  If medication does not help with contractions, then to go to [facility].  Verbalized understanding.

## 2019-01-13 NOTE — Telephone Encounter (Signed)
Patient called to make [place] aware that she went to the hospital late Saturday with contractions.  She stated they gave her a shot to stop them and advised her to keep her appointment for 01/16/19.  [phone]

## 2019-01-14 LAB — URINE CULTURE: Culture: 20000 — AB

## 2019-01-16 ENCOUNTER — Other Ambulatory Visit: Payer: Self-pay

## 2019-01-16 ENCOUNTER — Encounter: Payer: Self-pay | Admitting: Obstetrics and Gynecology

## 2019-01-16 ENCOUNTER — Ambulatory Visit (INDEPENDENT_AMBULATORY_CARE_PROVIDER_SITE_OTHER): Payer: Medicaid Other | Admitting: Obstetrics and Gynecology

## 2019-01-16 ENCOUNTER — Ambulatory Visit (INDEPENDENT_AMBULATORY_CARE_PROVIDER_SITE_OTHER): Payer: Medicaid Other

## 2019-01-16 VITALS — BP 140/99 | HR 85 | Wt 218.0 lb

## 2019-01-16 DIAGNOSIS — O0990 Supervision of high risk pregnancy, unspecified, unspecified trimester: Secondary | ICD-10-CM

## 2019-01-16 DIAGNOSIS — O0993 Supervision of high risk pregnancy, unspecified, third trimester: Secondary | ICD-10-CM

## 2019-01-16 DIAGNOSIS — O10913 Unspecified pre-existing hypertension complicating pregnancy, third trimester: Secondary | ICD-10-CM | POA: Diagnosis not present

## 2019-01-16 DIAGNOSIS — Z3A32 32 weeks gestation of pregnancy: Secondary | ICD-10-CM

## 2019-01-16 DIAGNOSIS — O10919 Unspecified pre-existing hypertension complicating pregnancy, unspecified trimester: Secondary | ICD-10-CM | POA: Diagnosis not present

## 2019-01-16 DIAGNOSIS — Z331 Pregnant state, incidental: Secondary | ICD-10-CM

## 2019-01-16 DIAGNOSIS — Z1389 Encounter for screening for other disorder: Secondary | ICD-10-CM

## 2019-01-16 LAB — POCT URINALYSIS DIPSTICK OB
Blood, UA: NEGATIVE
Glucose, UA: NEGATIVE
Ketones, UA: NEGATIVE
Leukocytes, UA: NEGATIVE
Nitrite, UA: NEGATIVE

## 2019-01-16 NOTE — Progress Notes (Signed)
Patient ID: Stephanie McgregorBrittany D Cerveny, female   DOB: Aug 02, 1986, 33 y.o.   MRN: [id number]    HIGH-RISK PREGNANCY VISIT Patient name: Stephanie Cook MRN [id number]  Date of birth: Aug 02, 1986 Chief Complaint:   High Risk Gestation (u/s today)  History of Present Illness:   Stephanie Cook is a 33 y.o. [id number] female at 6034w4d with an Estimated Date of Delivery: 03/09/19 being seen today for ongoing management of a high-risk pregnancy complicated by chronic HTN.  Today she reports daily contractions and says she had 3 during her US. She went to the ED for premature contractions on 01/11/2019 and was prescribed Procardia, which she did not take the because of the possible side effects. Denies bleeding or spotting. Her BP is beginning to creep up (140/99 today).   Contractions: Irregular. Vag. Bleeding: None.  Movement: Present. denies leaking of fluid.  Review of Systems:   Pertinent items are noted in HPI Denies abnormal vaginal discharge w/ itching/odor/irritation, headaches, visual changes, shortness of breath, chest pain, abdominal pain, severe nausea/vomiting, or problems with urination or bowel movements unless otherwise stated above. Pertinent History Reviewed:  Reviewed past medical,surgical, social, obstetrical and family history.  Reviewed problem list, medications and allergies. Physical Assessment:   Vitals:   01/16/19 1420  BP: (!) 140/99  Pulse: 85  Weight: 218 lb (98.9 kg)  Body mass index is 39.87 kg/m.           Physical Examination:   General appearance: alert, well appearing, and in no distress and oriented to person, place, and time  Mental status: alert, oriented to person, place, and time, affect appropriate to mood  Skin: warm & dry   Extremities: Edema: Trace    Cardiovascular: normal heart rate noted  Respiratory: normal respiratory effort, no distress  Abdomen: gravid, soft, non-tender  Pelvic: Cervical exam deferred         Fetal Status: Fetal Heart Rate  (bpm): 144 Fundal Height: 33 cm Movement: Present    Fetal Surveillance Testing today: NST, US 32+4 wks,cephalic,BPP 8/8,posterior placenta gr 1,normal ovaries bilat,fhr 144 bpm,RI .63,.69,.71,.66=85% S/D 83%,AFI 12 cm,efw 2368 g 86%,AC 97%  Results for orders placed or performed in visit on 01/16/19 (from the past 24 hour(s))  POC Urinalysis Dipstick OB   Collection Time: 01/16/19  2:26 PM  Result Value Ref Range   Color, UA     Clarity, UA     Glucose, UA Negative Negative   Bilirubin, UA     Ketones, UA neg    Spec Grav, UA     Blood, UA neg    pH, UA     POC,PROTEIN,UA Trace Negative, Trace, Small (1+), Moderate (2+), Large (3+), 4+   Urobilinogen, UA     Nitrite, UA neg    Leukocytes, UA Negative Negative   Appearance     Odor      Assessment & Plan:  1) High-risk pregnancy [id number] at 9034w4d with an Estimated Date of Delivery: 03/09/19   2) CHTN, stable with aldomet 250 BID + baby ASA  3) Pt had 3 contractions during her US today, so an NST was done. No contractions during NST.   4) Pt denied taking Procardia prescribed by the ED  Meds: No orders of the defined types were placed in this encounter.  Labs/procedures today: US, NST  Treatment Plan:  Begin twice weekly surveillance 32 weeks IOL 39 or as indicated  Reviewed: Preterm labor symptoms and general obstetric precautions including but not limited to  vaginal bleeding, contractions, leaking of fluid and fetal movement were reviewed in detail with the patient.  All questions were answered.  Follow-up: Return in 4 days (on 01/20/2019) for HROB, NST.  Orders Placed This Encounter  Procedures  . POC Urinalysis Dipstick OB   By signing my name below, I, Pietro Cassis, attest that this documentation has been prepared under the direction and in the presence of Tilda Burrow, MD. Electronically Signed: Pietro Cassis, Medical Scribe. 01/16/19. 3:04 PM.  I personally performed the services described in this  documentation, which was SCRIBED in my presence. The recorded information has been reviewed and considered accurate. It has been edited as necessary during review. Tilda Burrow, MD

## 2019-01-16 NOTE — Progress Notes (Addendum)
[place] 32+4 wks,cephalic,BPP 8/8,posterior placenta gr 1,normal ovaries bilat,fhr 144 bpm,RI .63,.69,.71,.66=85% S/D 83%,AFI 12 cm,efw 2368 g 86%,AC 97%

## 2019-01-20 ENCOUNTER — Encounter: Payer: Self-pay | Admitting: Obstetrics & Gynecology

## 2019-01-20 ENCOUNTER — Ambulatory Visit (INDEPENDENT_AMBULATORY_CARE_PROVIDER_SITE_OTHER): Payer: Medicaid Other | Admitting: Obstetrics & Gynecology

## 2019-01-20 VITALS — BP 124/87 | HR 109 | Wt 219.0 lb

## 2019-01-20 DIAGNOSIS — O10913 Unspecified pre-existing hypertension complicating pregnancy, third trimester: Secondary | ICD-10-CM | POA: Diagnosis not present

## 2019-01-20 DIAGNOSIS — K047 Periapical abscess without sinus: Secondary | ICD-10-CM

## 2019-01-20 DIAGNOSIS — Z3A33 33 weeks gestation of pregnancy: Secondary | ICD-10-CM | POA: Diagnosis not present

## 2019-01-20 DIAGNOSIS — O0993 Supervision of high risk pregnancy, unspecified, third trimester: Secondary | ICD-10-CM | POA: Diagnosis not present

## 2019-01-20 DIAGNOSIS — O0990 Supervision of high risk pregnancy, unspecified, unspecified trimester: Secondary | ICD-10-CM

## 2019-01-20 DIAGNOSIS — Z1389 Encounter for screening for other disorder: Secondary | ICD-10-CM

## 2019-01-20 DIAGNOSIS — Z331 Pregnant state, incidental: Secondary | ICD-10-CM

## 2019-01-20 DIAGNOSIS — O10919 Unspecified pre-existing hypertension complicating pregnancy, unspecified trimester: Secondary | ICD-10-CM

## 2019-01-20 LAB — POCT URINALYSIS DIPSTICK OB
Glucose, UA: NEGATIVE
Ketones, UA: NEGATIVE
Nitrite, UA: NEGATIVE

## 2019-01-20 MED ORDER — AMOXICILLIN 500 MG PO CAPS: 500.0000 mg | ORAL_CAPSULE | Freq: Three times a day (TID) | ORAL | 0 refills | Status: DC

## 2019-01-20 NOTE — Progress Notes (Signed)
   HIGH-RISK PREGNANCY VISIT Patient name: Stephanie Cook MRN [id number]  Date of birth: Feb 27, 1986 Chief Complaint:   High Risk Gestation (NST; has sore gums)  History of Present Illness:   Stephanie Cook is a 33 y.o. [phone] female at [redacted]w[redacted]d with an Estimated Date of Delivery: 03/09/19 being seen today for ongoing management of a high-risk pregnancy complicated by chronic HTN.  Today she reports tooth gum ache. Contractions: Irregular. Vag. Bleeding: None.  Movement: Present. denies leaking of fluid.  Review of Systems:   Pertinent items are noted in HPI Denies abnormal vaginal discharge w/ itching/odor/irritation, headaches, visual changes, shortness of breath, chest pain, abdominal pain, severe nausea/vomiting, or problems with urination or bowel movements unless otherwise stated above. Pertinent History Reviewed:  Reviewed past medical,surgical, social, obstetrical and family history.  Reviewed problem list, medications and allergies. Physical Assessment:   Vitals:   01/20/19 1511  BP: 124/87  Pulse: (!) 109  Weight: 219 lb (99.3 kg)  Body mass index is 40.06 kg/m.           Physical Examination:   General appearance: alert, well appearing, and in no distress  Mental status: alert, oriented to person, place, and time  Skin: warm & dry   Extremities: Edema: Trace    Cardiovascular: normal heart rate noted  Respiratory: normal respiratory effort, no distress  Abdomen: gravid, soft, non-tender  Pelvic: Cervical exam deferred         Fetal Status:     Movement: Present    Fetal Surveillance Testing today: reactive NST   Results for orders placed or performed in visit on 01/20/19 (from the past 24 hour(s))  POC Urinalysis Dipstick OB   Collection Time: 01/20/19  3:12 PM  Result Value Ref Range   Color, UA     Clarity, UA     Glucose, UA Negative Negative   Bilirubin, UA     Ketones, UA neg    Spec Grav, UA     Blood, UA trace    pH, UA     POC,PROTEIN,UA Small  (1+) Negative, Trace, Small (1+), Moderate (2+), Large (3+), 4+   Urobilinogen, UA     Nitrite, UA neg    Leukocytes, UA Moderate (2+) (A) Negative   Appearance     Odor      Assessment & Plan:  1) High-risk pregnancy [id number] at [redacted]w[redacted]d with an Estimated Date of Delivery: 03/09/19   2) CHTN, stable, aldomet 250 BID, reactive NST  3) Dental carries, unstable, amoxicillin for infected tooth/gum, left upper molar, tooth #15  Meds:  Meds ordered this encounter  Medications  . amoxicillin (AMOXIL) 500 MG capsule    Sig: Take 1 capsule (500 mg total) by mouth 3 (three) times daily.    Dispense:  30 capsule    Refill:  0    Labs/procedures today:   Treatment Plan:  Twice weekly surveillance  Reviewed: Preterm labor symptoms and general obstetric precautions including but not limited to vaginal bleeding, contractions, leaking of fluid and fetal movement were reviewed in detail with the patient.  All questions were answered.  Follow-up: No follow-ups on file.  Orders Placed This Encounter  Procedures  . POC Urinalysis Dipstick OB   Lazaro Arms  01/20/2019 3:43 PM

## 2019-01-23 ENCOUNTER — Ambulatory Visit (INDEPENDENT_AMBULATORY_CARE_PROVIDER_SITE_OTHER): Payer: Medicaid Other

## 2019-01-23 ENCOUNTER — Ambulatory Visit (INDEPENDENT_AMBULATORY_CARE_PROVIDER_SITE_OTHER): Payer: Medicaid Other | Admitting: Advanced Practice Midwife

## 2019-01-23 VITALS — BP 127/81 | HR 94 | Wt 221.0 lb

## 2019-01-23 DIAGNOSIS — B009 Herpesviral infection, unspecified: Secondary | ICD-10-CM

## 2019-01-23 DIAGNOSIS — Z331 Pregnant state, incidental: Secondary | ICD-10-CM

## 2019-01-23 DIAGNOSIS — Z1389 Encounter for screening for other disorder: Secondary | ICD-10-CM

## 2019-01-23 DIAGNOSIS — O0993 Supervision of high risk pregnancy, unspecified, third trimester: Secondary | ICD-10-CM | POA: Diagnosis not present

## 2019-01-23 DIAGNOSIS — O10919 Unspecified pre-existing hypertension complicating pregnancy, unspecified trimester: Secondary | ICD-10-CM

## 2019-01-23 DIAGNOSIS — O10019 Pre-existing essential hypertension complicating pregnancy, unspecified trimester: Secondary | ICD-10-CM

## 2019-01-23 DIAGNOSIS — Z3A33 33 weeks gestation of pregnancy: Secondary | ICD-10-CM

## 2019-01-23 DIAGNOSIS — O10013 Pre-existing essential hypertension complicating pregnancy, third trimester: Secondary | ICD-10-CM

## 2019-01-23 DIAGNOSIS — O0990 Supervision of high risk pregnancy, unspecified, unspecified trimester: Secondary | ICD-10-CM

## 2019-01-23 LAB — POCT URINALYSIS DIPSTICK OB
Blood, UA: NEGATIVE
Glucose, UA: NEGATIVE
Ketones, UA: NEGATIVE
Nitrite, UA: NEGATIVE
POC,PROTEIN,UA: NEGATIVE

## 2019-01-23 MED ORDER — ACYCLOVIR 400 MG PO TABS: 400.0000 mg | ORAL_TABLET | Freq: Three times a day (TID) | ORAL | 2 refills | Status: DC

## 2019-01-23 NOTE — Progress Notes (Signed)
[place] 33+4 wks,cephalic,BPP 8/8,posterior placenta gr 1,afi 11 cm,fhr 148 bpm,RI .70,.63,.67=80%

## 2019-01-23 NOTE — Progress Notes (Signed)
HIGH-RISK PREGNANCY VISIT Patient name: Stephanie Cook MRN [id number]  Date of birth: 14-Jul-1986 Chief Complaint:   Routine Prenatal Visit ([place] today)  History of Present Illness:   Stephanie Cook is a 33 y.o. [phone] female at [redacted]w[redacted]d with an Estimated Date of Delivery: 03/09/19 being seen today for ongoing management of a high-risk pregnancy complicated by chronic HTN.  Today she reports no complaints. Contractions: Irritability. Vag. Bleeding: None.  Movement: Present. denies leaking of fluid.  Review of Systems:   Pertinent items are noted in HPI Denies abnormal vaginal discharge w/ itching/odor/irritation, headaches, visual changes, shortness of breath, chest pain, abdominal pain, severe nausea/vomiting, or problems with urination or bowel movements unless otherwise stated above.    Pertinent History Reviewed:  Medical & Surgical Hx:   Past Medical History:  Diagnosis Date  . Depression   . Frequent urination 09/28/2015  . HSV-2 (herpes simplex virus 2) infection   . Hx of chlamydia infection    multiple times  . Hx of gonorrhea   . Hx of trichomoniasis    recurrent, 4 times  . Hypertension   . Missed period 09/28/2015  . Pregnant   . Preterm labor   . STD (female)    chlamydia, trichomonas, gonorrhea, HPV, HSV   Past Surgical History:  Procedure Laterality Date  . NO PAST SURGERIES     Family History  Problem Relation Age of Onset  . Lupus Mother   . Cirrhosis Mother   . Hypertension Maternal Grandmother     Current Outpatient Medications:  .  albuterol (PROVENTIL HFA;VENTOLIN HFA) 108 (90 Base) MCG/ACT inhaler, Inhale 2 puffs into the lungs every 6 (six) hours as needed for wheezing or shortness of breath., Disp: 1 Inhaler, Rfl: 2 .  aspirin EC 81 MG tablet, Take 2 tablets (162 mg total) by mouth daily., Disp: 60 tablet, Rfl: 6 .  methyldopa (ALDOMET) 250 MG tablet, Take 1 tablet (250 mg total) by mouth 2 (two) times daily., Disp: 60 tablet, Rfl: 6 .   Prenatal MV-Min-FA-Omega-3 (PRENATAL GUMMIES/DHA & FA PO), Take by mouth., Disp: , Rfl:  .  acyclovir (ZOVIRAX) 400 MG tablet, Take 1 tablet (400 mg total) by mouth 3 (three) times daily., Disp: 90 tablet, Rfl: 2 .  amoxicillin (AMOXIL) 500 MG capsule, Take 1 capsule (500 mg total) by mouth 3 (three) times daily. (Patient not taking: Reported on 01/23/2019), Disp: 30 capsule, Rfl: 0 Social History: Reviewed -  reports that she has quit smoking. Her smoking use included cigarettes. She has a 4.00 pack-year smoking history. She has never used smokeless tobacco.   Physical Assessment:   Vitals:   01/23/19 1520  BP: 127/81  Pulse: 94  Weight: 221 lb (100.2 kg)  Body mass index is 40.42 kg/m.           Physical Examination:   General appearance: alert, well appearing, and in no distress  Mental status: alert, oriented to person, place, and time  Skin: warm & dry   Extremities: Edema: Trace    Cardiovascular: normal heart rate noted  Respiratory: normal respiratory effort, no distress  Abdomen: gravid, soft, non-tender  Pelvic: Cervical exam deferred         Fetal Status:     Movement: Present    Fetal Surveillance Testing today: [place] 33+4 wks,cephalic,BPP 8/8,posterior placenta gr 1,afi 11 cm,fhr 148 bpm,RI .70,.63,.67=80%  Results for orders placed or performed in visit on 01/23/19 (from the past 24 hour(s))  POC Urinalysis Dipstick OB  Collection Time: 01/23/19  3:02 PM  Result Value Ref Range   Color, UA     Clarity, UA     Glucose, UA Negative Negative   Bilirubin, UA     Ketones, UA neg    Spec Grav, UA     Blood, UA neg    pH, UA     POC,PROTEIN,UA Negative Negative, Trace, Small (1+), Moderate (2+), Large (3+), 4+   Urobilinogen, UA     Nitrite, UA neg    Leukocytes, UA Large (3+) (A) Negative   Appearance     Odor      Assessment & Plan:  1) High-risk pregnancy [id number] at [redacted]w[redacted]d with an Estimated Date of Delivery: 03/09/19   2) CHTN, stable.  Treatment Plan:  Twice  weekly testing, continue meds  3) HSV, .  Treatment Plan:  Start acyclovir 34 weeks  Labs/procedures today:    Follow-up: Return for Mondays NST/HROB /Thrusdays US/HROB (may do 2 NSTs when amber is out).  Future Appointments  Date Time Provider Department Center  01/23/2019  3:45 PM Cresenzo-Dishmon, Scarlette Calico, CNM [facility]    Orders Placed This Encounter  Procedures  . POC Urinalysis Dipstick OB   Jacklyn Shell CNM 01/23/2019 3:35 PM

## 2019-01-24 ENCOUNTER — Inpatient Hospital Stay (HOSPITAL_COMMUNITY): Payer: Medicaid Other

## 2019-01-24 ENCOUNTER — Inpatient Hospital Stay (HOSPITAL_COMMUNITY): Payer: Medicaid Other | Admitting: Anesthesiology

## 2019-01-24 ENCOUNTER — Other Ambulatory Visit: Payer: Self-pay

## 2019-01-24 ENCOUNTER — Inpatient Hospital Stay (HOSPITAL_COMMUNITY)
Admission: AD | Admit: 2019-01-24 | Discharge: 2019-01-26 | DRG: 806 | Disposition: A | Discharge status: Home / Self Care | Payer: Medicaid Other | Attending: Obstetrics and Gynecology | Admitting: Obstetrics and Gynecology

## 2019-01-24 ENCOUNTER — Encounter (HOSPITAL_COMMUNITY): Payer: Self-pay | Admitting: *Deleted

## 2019-01-24 DIAGNOSIS — O09899 Supervision of other high risk pregnancies, unspecified trimester: Secondary | ICD-10-CM

## 2019-01-24 DIAGNOSIS — O99344 Other mental disorders complicating childbirth: Secondary | ICD-10-CM | POA: Diagnosis present

## 2019-01-24 DIAGNOSIS — O09219 Supervision of pregnancy with history of pre-term labor, unspecified trimester: Secondary | ICD-10-CM

## 2019-01-24 DIAGNOSIS — O10013 Pre-existing essential hypertension complicating pregnancy, third trimester: Secondary | ICD-10-CM

## 2019-01-24 DIAGNOSIS — B009 Herpesviral infection, unspecified: Secondary | ICD-10-CM | POA: Diagnosis present

## 2019-01-24 DIAGNOSIS — O1002 Pre-existing essential hypertension complicating childbirth: Secondary | ICD-10-CM | POA: Diagnosis present

## 2019-01-24 DIAGNOSIS — O09213 Supervision of pregnancy with history of pre-term labor, third trimester: Secondary | ICD-10-CM | POA: Diagnosis not present

## 2019-01-24 DIAGNOSIS — O99214 Obesity complicating childbirth: Secondary | ICD-10-CM | POA: Diagnosis present

## 2019-01-24 DIAGNOSIS — Z87891 Personal history of nicotine dependence: Secondary | ICD-10-CM

## 2019-01-24 DIAGNOSIS — O4693 Antepartum hemorrhage, unspecified, third trimester: Secondary | ICD-10-CM

## 2019-01-24 DIAGNOSIS — O4593 Premature separation of placenta, unspecified, third trimester: Secondary | ICD-10-CM | POA: Diagnosis present

## 2019-01-24 DIAGNOSIS — Z3A33 33 weeks gestation of pregnancy: Secondary | ICD-10-CM

## 2019-01-24 DIAGNOSIS — F329 Major depressive disorder, single episode, unspecified: Secondary | ICD-10-CM | POA: Diagnosis present

## 2019-01-24 DIAGNOSIS — F32A Depression, unspecified: Secondary | ICD-10-CM | POA: Diagnosis present

## 2019-01-24 DIAGNOSIS — O10019 Pre-existing essential hypertension complicating pregnancy, unspecified trimester: Secondary | ICD-10-CM | POA: Diagnosis present

## 2019-01-24 DIAGNOSIS — O9989 Other specified diseases and conditions complicating pregnancy, childbirth and the puerperium: Secondary | ICD-10-CM | POA: Diagnosis not present

## 2019-01-24 DIAGNOSIS — O0990 Supervision of high risk pregnancy, unspecified, unspecified trimester: Secondary | ICD-10-CM

## 2019-01-24 LAB — CBC
HCT: 33.8 % — ABNORMAL LOW (ref 36.0–46.0)
Hemoglobin: 11 g/dL — ABNORMAL LOW (ref 12.0–15.0)
MCH: 28 pg (ref 26.0–34.0)
MCHC: 32.5 g/dL (ref 30.0–36.0)
MCV: 86 fL (ref 80.0–100.0)
Platelets: 223 10*3/uL (ref 150–400)
RBC: 3.93 MIL/uL (ref 3.87–5.11)
RDW: 13.5 % (ref 11.5–15.5)
WBC: 11.4 10*3/uL — AB (ref 4.0–10.5)

## 2019-01-24 LAB — RPR: RPR Ser Ql: NONREACTIVE

## 2019-01-24 LAB — ABO/RH: ABO/RH(D): B POS

## 2019-01-24 LAB — GROUP B STREP BY PCR: Group B strep by PCR: NEGATIVE

## 2019-01-24 LAB — TYPE AND SCREEN
ABO/RH(D): B POS
Antibody Screen: NEGATIVE

## 2019-01-24 MED ORDER — WITCH HAZEL-GLYCERIN EX PADS: 1.0000 "application " | MEDICATED_PAD | CUTANEOUS | Status: DC | PRN

## 2019-01-24 MED ORDER — LACTATED RINGERS IV SOLN: 500.0000 mL | Freq: Once | INTRAVENOUS | Status: DC

## 2019-01-24 MED ORDER — PHENYLEPHRINE 40 MCG/ML (10ML) SYRINGE FOR IV PUSH (FOR BLOOD PRESSURE SUPPORT)
80.0000 ug | PREFILLED_SYRINGE | INTRAVENOUS | Status: DC | PRN
  Filled 2019-01-24: qty 10

## 2019-01-24 MED ORDER — SENNOSIDES-DOCUSATE SODIUM 8.6-50 MG PO TABS
2.0000 | ORAL_TABLET | ORAL | Status: DC
  Administered 2019-01-25: 2 via ORAL
  Filled 2019-01-24 (×2): qty 2

## 2019-01-24 MED ORDER — ONDANSETRON HCL 4 MG/2ML IJ SOLN: 4.0000 mg | INTRAMUSCULAR | Status: DC | PRN

## 2019-01-24 MED ORDER — METHYLDOPA 250 MG PO TABS
250.0000 mg | ORAL_TABLET | Freq: Two times a day (BID) | ORAL | Status: DC
  Filled 2019-01-24: qty 1

## 2019-01-24 MED ORDER — ACETAMINOPHEN 325 MG PO TABS
650.0000 mg | ORAL_TABLET | ORAL | Status: DC | PRN
  Administered 2019-01-24: 650 mg via ORAL
  Filled 2019-01-24: qty 2

## 2019-01-24 MED ORDER — SIMETHICONE 80 MG PO CHEW: 80.0000 mg | CHEWABLE_TABLET | ORAL | Status: DC | PRN

## 2019-01-24 MED ORDER — DIBUCAINE 1 % RE OINT: 1.0000 "application " | TOPICAL_OINTMENT | RECTAL | Status: DC | PRN

## 2019-01-24 MED ORDER — FENTANYL CITRATE (PF) 100 MCG/2ML IJ SOLN
50.0000 ug | INTRAMUSCULAR | Status: DC | PRN
  Administered 2019-01-24: 100 ug via INTRAVENOUS
  Filled 2019-01-24: qty 2

## 2019-01-24 MED ORDER — LIDOCAINE HCL (PF) 1 % IJ SOLN
INTRAMUSCULAR | Status: DC | PRN
  Administered 2019-01-24 (×2): 4 mL via EPIDURAL

## 2019-01-24 MED ORDER — PHENYLEPHRINE 40 MCG/ML (10ML) SYRINGE FOR IV PUSH (FOR BLOOD PRESSURE SUPPORT)
80.0000 ug | PREFILLED_SYRINGE | INTRAVENOUS | Status: DC | PRN
  Filled 2019-01-24 (×2): qty 10

## 2019-01-24 MED ORDER — AMLODIPINE BESYLATE 10 MG PO TABS
10.0000 mg | ORAL_TABLET | Freq: Every day | ORAL | Status: DC
  Administered 2019-01-24 – 2019-01-26 (×3): 10 mg via ORAL
  Filled 2019-01-24 (×3): qty 1

## 2019-01-24 MED ORDER — LACTATED RINGERS IV SOLN
500.0000 mL | INTRAVENOUS | Status: DC | PRN
  Administered 2019-01-24: 1000 mL via INTRAVENOUS

## 2019-01-24 MED ORDER — SOD CITRATE-CITRIC ACID 500-334 MG/5ML PO SOLN: 30.0000 mL | ORAL | Status: DC | PRN

## 2019-01-24 MED ORDER — OXYTOCIN 40 UNITS IN NORMAL SALINE INFUSION - SIMPLE MED
2.5000 [IU]/h | INTRAVENOUS | Status: DC
  Administered 2019-01-24: 2.5 [IU]/h via INTRAVENOUS
  Filled 2019-01-24: qty 1000

## 2019-01-24 MED ORDER — PENICILLIN G 3 MILLION UNITS IVPB - SIMPLE MED
3.0000 10*6.[IU] | INTRAVENOUS | Status: DC
  Filled 2019-01-24 (×2): qty 100

## 2019-01-24 MED ORDER — PRENATAL MULTIVITAMIN CH
1.0000 | ORAL_TABLET | Freq: Every day | ORAL | Status: DC
  Administered 2019-01-24 – 2019-01-26 (×3): 1 via ORAL
  Filled 2019-01-24 (×3): qty 1

## 2019-01-24 MED ORDER — BETAMETHASONE SOD PHOS & ACET 6 (3-3) MG/ML IJ SUSP
12.0000 mg | INTRAMUSCULAR | Status: DC
  Administered 2019-01-24: 12 mg via INTRAMUSCULAR
  Filled 2019-01-24: qty 2

## 2019-01-24 MED ORDER — OXYCODONE-ACETAMINOPHEN 5-325 MG PO TABS: 2.0000 | ORAL_TABLET | ORAL | Status: DC | PRN

## 2019-01-24 MED ORDER — DIPHENHYDRAMINE HCL 25 MG PO CAPS: 25.0000 mg | ORAL_CAPSULE | Freq: Four times a day (QID) | ORAL | Status: DC | PRN

## 2019-01-24 MED ORDER — EPHEDRINE 5 MG/ML INJ
10.0000 mg | INTRAVENOUS | Status: DC | PRN
  Filled 2019-01-24: qty 2

## 2019-01-24 MED ORDER — ONDANSETRON HCL 4 MG/2ML IJ SOLN: 4.0000 mg | Freq: Four times a day (QID) | INTRAMUSCULAR | Status: DC | PRN

## 2019-01-24 MED ORDER — SODIUM CHLORIDE 0.9 % IV SOLN
5.0000 10*6.[IU] | Freq: Once | INTRAVENOUS | Status: AC
  Administered 2019-01-24: 5 10*6.[IU] via INTRAVENOUS
  Filled 2019-01-24: qty 5

## 2019-01-24 MED ORDER — OXYCODONE-ACETAMINOPHEN 5-325 MG PO TABS: 1.0000 | ORAL_TABLET | ORAL | Status: DC | PRN

## 2019-01-24 MED ORDER — DIPHENHYDRAMINE HCL 50 MG/ML IJ SOLN: 12.5000 mg | INTRAMUSCULAR | Status: DC | PRN

## 2019-01-24 MED ORDER — TETANUS-DIPHTH-ACELL PERTUSSIS 5-2.5-18.5 LF-MCG/0.5 IM SUSP: 0.5000 mL | Freq: Once | INTRAMUSCULAR | Status: DC

## 2019-01-24 MED ORDER — COCONUT OIL OIL
1.0000 "application " | TOPICAL_OIL | Status: DC | PRN
  Administered 2019-01-24: 1 via TOPICAL
  Filled 2019-01-24: qty 120

## 2019-01-24 MED ORDER — ONDANSETRON HCL 4 MG PO TABS: 4.0000 mg | ORAL_TABLET | ORAL | Status: DC | PRN

## 2019-01-24 MED ORDER — FENTANYL 2.5 MCG/ML BUPIVACAINE 1/10 % EPIDURAL INFUSION (WH - ANES)
14.0000 mL/h | INTRAMUSCULAR | Status: DC | PRN
  Administered 2019-01-24: 14 mL/h via EPIDURAL
  Filled 2019-01-24: qty 100

## 2019-01-24 MED ORDER — BENZOCAINE-MENTHOL 20-0.5 % EX AERO
1.0000 "application " | INHALATION_SPRAY | CUTANEOUS | Status: DC | PRN
  Administered 2019-01-24: 1 via TOPICAL
  Filled 2019-01-24: qty 56

## 2019-01-24 MED ORDER — OXYTOCIN BOLUS FROM INFUSION
500.0000 mL | Freq: Once | INTRAVENOUS | Status: AC
  Administered 2019-01-24: 500 mL via INTRAVENOUS

## 2019-01-24 MED ORDER — ACETAMINOPHEN 325 MG PO TABS
650.0000 mg | ORAL_TABLET | ORAL | Status: DC | PRN
  Administered 2019-01-24 (×2): 650 mg via ORAL
  Filled 2019-01-24 (×2): qty 2

## 2019-01-24 MED ORDER — IBUPROFEN 600 MG PO TABS: 600.0000 mg | ORAL_TABLET | Freq: Four times a day (QID) | ORAL | Status: DC

## 2019-01-24 MED ORDER — TRAMADOL HCL 50 MG PO TABS
50.0000 mg | ORAL_TABLET | Freq: Four times a day (QID) | ORAL | Status: DC | PRN
  Administered 2019-01-24: 50 mg via ORAL
  Filled 2019-01-24: qty 1

## 2019-01-24 MED ORDER — LIDOCAINE HCL (PF) 1 % IJ SOLN
30.0000 mL | INTRAMUSCULAR | Status: DC | PRN
  Filled 2019-01-24: qty 30

## 2019-01-24 MED ORDER — LACTATED RINGERS IV SOLN
INTRAVENOUS | Status: DC
  Administered 2019-01-24: 04:00:00 via INTRAVENOUS

## 2019-01-24 NOTE — MAU Note (Signed)
PT ARRIVED VIA [facility] AT .   WAS [facility] YESTERDAY.  HAS BEEN UC'S X2 WEEKS .    ON ARRIVAL-  DRIED  BLOOD ON THIGHS, FHR- 140.      SAYS HX OF HSV- DENIES OUTBREAK DURING PREG.   Marland Kitchen  LAST SEX-  NOT THIS WEEK

## 2019-01-24 NOTE — H&P (Signed)
Aviva SignsWilliams, Marie L, CNM  Certified Nurse Midwife  Obstetrics  MAU Provider Note  Cosign Needed  Date of Service:  01/24/2019 1:09 AM          Cosign Needed      Expand All Collapse All    Show:Clear all [x] Manual[x] Template[x] Copied  Added by: [x] Aviva SignsWilliams, Marie L, CNM  [] Hover for details Chief Complaint:  No chief complaint on file.   First Provider Initiated Contact with Patient 01/24/19 0108     HPI: Elray McgregorBrittany D Offer is a 33 y.o. [id number] at 2333w5dwho presents to maternity admissions reporting leaking of fluid since midnight.  States has been contracting for 2 weeks but was not checked yesterday.. She reports good fetal movement, denies vaginal itching/burning, urinary symptoms, h/a, dizziness, n/v, diarrhea, constipation or fever/chills.    Pregnancy has been remarkable for chronic hypertension  Vaginal Bleeding  The patient's primary symptoms include pelvic pain and vaginal bleeding. The patient's pertinent negatives include no genital itching, genital lesions or genital odor. This is a new problem. The current episode started today. The problem occurs constantly. The problem has been unchanged. She is pregnant. Associated symptoms include abdominal pain. Pertinent negatives include no constipation, diarrhea or fever. The vaginal discharge was bloody. The vaginal bleeding is typical of menses. She has not been passing clots. She has not been passing tissue. Nothing aggravates the symptoms. She has tried nothing for the symptoms.    RN Note: PT ARRIVED VIA [facility] AT [facility]MN. WAS AT [facility]EST[facility]DAY. HAS BEEN UC'S X2 WEEKS . ON ARRIVAL- DRIED BLOOD ON THIGHS, FHR- 140. SAYS HX OF HSV- DENIES OUTBREAK DURING PREG. Marland Kitchen. LAST SEX- NOT THIS WEEK   Past Medical History:     Past Medical History:  Diagnosis Date  . Depression   . Frequent urination 09/28/2015  . HSV-2 (herpes simplex virus 2) infection   . Hx of chlamydia  infection    multiple times  . Hx of gonorrhea   . Hx of trichomoniasis    recurrent, 4 times  . Hypertension   . Missed period 09/28/2015  . Pregnant   . Preterm labor   . STD (female)    chlamydia, trichomonas, gonorrhea, HPV, HSV    Past obstetric history:                 OB History  Gravida Para Term Preterm AB Living  6 4 2 2 1 4   SAB TAB Ectopic Multiple Live Births     1 0 0 0 4       # Outcome Date GA Lbr Len/2nd Weight Sex Delivery Anes PTL Lv  6 Current           5 Preterm 10/26/13 7939w3d 20:36 / 00:13 2870 g M Vag-Spont EPI N LIV     Birth Comments: Pregnancy complications: +HSV 2 on acyclovir, MJ use, Etoh use, on 17P (previous preterm delivery), smoker, hypertension on aldomet, h/o multiple chlaymdia infections, h/o GC. Depression.  4 SAB 12/2012 678w0d            Birth Comments: System Generated. Please review and update pregnancy details.  3 Preterm 05/04/12 2163w0d  2211 g F Vag-Spont None Y LIV     Birth Comments: System Generated. Please review and update pregnancy details.  2 Term 07/25/09 67378w0d 03:30 2920 g F Vag-Spont EPI N LIV  1 Term 02/01/08 61378w0d 11:00 3033 g M Vag-Spont EPI N LIV    Past Surgical History:  Past Surgical History:  Procedure Laterality Date  . NO PAST SURGERIES      Family History:      Family History  Problem Relation Age of Onset  . Lupus Mother   . Cirrhosis Mother   . Hypertension Maternal Grandmother     Social History: Social History        Tobacco Use  . Smoking status: Former Smoker    Packs/day: 0.50    Years: 8.00    Pack years: 4.00    Types: Cigarettes  . Smokeless tobacco: Never Used  Substance Use Topics  . Alcohol use: Not Currently    Comment: occasionally  . Drug use: Not Currently    Types: Marijuana    Allergies:      Allergies  Allergen Reactions  . Motrin [Ibuprofen] Hives    Meds:         Medications Prior to Admission    Medication Sig Dispense Refill Last Dose  . albuterol (PROVENTIL HFA;VENTOLIN HFA) 108 (90 Base) MCG/ACT inhaler Inhale 2 puffs into the lungs every 6 (six) hours as needed for wheezing or shortness of breath. 1 Inhaler 2 Past Week at Unknown time  . aspirin EC 81 MG tablet Take 2 tablets (162 mg total) by mouth daily. 60 tablet 6 Past Week at Unknown time  . methyldopa (ALDOMET) 250 MG tablet Take 1 tablet (250 mg total) by mouth 2 (two) times daily. 60 tablet 6 01/23/2019 at Unknown time  . acyclovir (ZOVIRAX) 400 MG tablet Take 1 tablet (400 mg total) by mouth 3 (three) times daily. 90 tablet 2 NONE YET  . amoxicillin (AMOXIL) 500 MG capsule Take 1 capsule (500 mg total) by mouth 3 (three) times daily. (Patient not taking: Reported on 01/23/2019) 30 capsule 0 Not Taking  . Prenatal MV-Min-FA-Omega-3 (PRENATAL GUMMIES/DHA & FA PO) Take by mouth.   Taking    I have reviewed patient's Past Medical Hx, Surgical Hx, Family Hx, Social Hx, medications and allergies.   ROS:  Review of Systems  Constitutional: Negative for fever.  Gastrointestinal: Positive for abdominal pain. Negative for constipation and diarrhea.  Genitourinary: Positive for pelvic pain and vaginal bleeding.   Other systems negative  Physical Exam   Patient Vitals for the past 24 hrs:  BP Temp Temp src Pulse Resp  01/24/19 0059 132/89 - Oral 79 20   Constitutional: Well-developed, well-nourished female in no acute distress.  Cardiovascular: normal rate and rhythm Respiratory: normal effort, clear to auscultation bilaterally GI: Abd soft, non-tender, gravid appropriate for gestational age.   No rebound or guarding. MS: Extremities nontender, no edema, normal ROM Neurologic: Alert and oriented x 4.  GU: Neg CVAT.  PELVIC EXAM: Moderate blood in vault.  Thin and mucous like.  No pooling or ferning however.  Unable to visualize cervix    Digital exam done >> 2cm  Dilation: 2 Effacement (%): 90 Station:  -2 Presentation: Vertex  FHT:  Baseline 140 , moderate variability, accelerations present, no decelerations Contractions: q 2-3 mins Irregular     Labs: LabResultsLast24Hours       Results for orders placed or performed in visit on 01/23/19 (from the past 24 hour(s))  POC Urinalysis Dipstick OB     Status: Abnormal   Collection Time: 01/23/19  3:02 PM  Result Value Ref Range   Color, UA     Clarity, UA     Glucose, UA Negative Negative   Bilirubin, UA     Ketones, UA neg  Spec Grav, UA     Blood, UA neg    pH, UA     POC,PROTEIN,UA Negative Negative, Trace, Small (1+), Moderate (2+), Large (3+), 4+   Urobilinogen, UA     Nitrite, UA neg    Leukocytes, UA Large (3+) (A) Negative   Appearance     Odor       B/Positive/-- (08/22 1516)  Imaging:    MAU Course/MDM: I have ordered labs and usual labor orders.  Since she is bleeding moderately heavy, will admit to [facility] to see if labor progresses.  Will not start on Magnesium sulfate since she is over 32 weeks.   Betamethasone series.  Will check [place] for AFI and placental evaluation due to bleeding NST reviewed, reactive.    Unable to do Amnisure due to bleeding  Assessment: Single intrauterine pregnancy at [redacted]w[redacted]d Preterm labor Vaginal bleeding in the third trimester Bulging membranes, negative Fern  Plan: Admit to [facility] Routine orders Betamethasone series [place] for AFI and placental evaluation MD to follow   Wynelle Bourgeois CNM, MSN Certified Nurse-Midwife 01/24/2019 1:09 AM

## 2019-01-24 NOTE — Lactation Note (Signed)
This note was copied from a baby's chart. Lactation Consultation Note  Patient Name: Stephanie Cook Today's Date: 01/24/2019 Reason for consult: Initial assessment;NICU baby;Preterm <34wks  Baby is 7 hours old  LC visited mom in room 303.  Per mom had just pumped both breast/ no milk and cramping.  LC reviewed supply and demand/ reassured mom its normal  To be a slow process. LC recommended - hand express / pump both breast  For 15 -20 mins / and then hand express again.  LC explained hand expressing to mom 1st and then she was able to demo back to  [facility] with very little assistance, no EBM yield . Reassured mom it can be a slow  Process / consistent hand expressing / and pumping / STS is the key to enhance  Let down. Per mom had several breast changes with pregnancy.  Per mom active with WIC - [facility] will fax a a request for a [facility] loaner.  If mom goes home on Sunday she will have to do a [facility] loaner from [facility] .  LC explained the [facility] loaner process and the $30.00 cash x 12 days/ mom receptive.  Mother informed of post-discharge support and given phone number to the lactation department, including services for phone call assistance; out-patient appointments; and breastfeeding support group. List of other breastfeeding resources in the community given in the handout. Encouraged mother to call for problems or concerns related to breastfeeding.   Maternal Data Has patient been taught Hand Expression?: Yes  Feeding    LATCH Score                   Interventions Interventions: Breast feeding basics reviewed;Hand express;DEBP  Lactation Tools Discussed/Used Tools: Pump Breast pump type: Double-Electric Breast Pump WIC Program: Yes(per mom / LC faxed a request for a DEBP to [facility] ) Pump Review: Setup, frequency, and cleaning;Milk Storage Initiated by:: setup by the RN    Consult Status Consult Status: Follow-up Date: 01/25/19 Follow-up type:  In-patient    Stephanie Cook 01/24/2019, 12:38 PM

## 2019-01-24 NOTE — Anesthesia Postprocedure Evaluation (Signed)
Anesthesia Post Note  Patient: Stephanie Cook  Procedure(s) Performed: AN AD HOC LABOR EPIDURAL     Patient location during evaluation: Mother Baby Anesthesia Type: Epidural Level of consciousness: awake and alert and oriented Pain management: satisfactory to patient Vital Signs Assessment: post-procedure vital signs reviewed and stable Respiratory status: respiratory function stable Cardiovascular status: stable Postop Assessment: no headache, no backache, epidural receding, patient able to bend at knees, no signs of nausea or vomiting and adequate PO intake Anesthetic complications: no    Last Vitals:  Vitals:   01/24/19 0630 01/24/19 0709  BP: 134/83 129/90  Pulse: 74 70  Resp: 18   Temp:  36.9 C  SpO2:  100%    Last Pain: 3 Vitals:   01/24/19 0710  TempSrc:   PainSc: 0-No pain   Pain Goal:  3              Epidural/Spinal Function Cutaneous sensation: Normal sensation (01/24/19 0710)  [name]

## 2019-01-24 NOTE — Anesthesia Preprocedure Evaluation (Signed)
Anesthesia Evaluation  Patient identified by MRN, date of birth, ID band Patient awake    Reviewed: Allergy & Precautions, Patient's Chart, lab work & pertinent test results  History of Anesthesia Complications Negative for: history of anesthetic complications  Airway Mallampati: II  TM Distance: >3 FB Neck ROM: Full    Dental  (+) Teeth Intact   Pulmonary former smoker,    Pulmonary exam normal breath sounds clear to auscultation       Cardiovascular hypertension, Pt. on medications Normal cardiovascular exam Rhythm:Regular Rate:Normal     Neuro/Psych negative neurological ROS     GI/Hepatic negative GI ROS, Neg liver ROS,   Endo/Other  Morbid obesity  Renal/GU negative Renal ROS     Musculoskeletal negative musculoskeletal ROS (+)   Abdominal   Peds  Hematology negative hematology ROS (+)   Anesthesia Other Findings Day of surgery medications reviewed with the patient.  Reproductive/Obstetrics (+) Pregnancy                             Anesthesia Physical Anesthesia Plan  ASA: III  Anesthesia Plan: Epidural   Post-op Pain Management:    Induction:   PONV Risk Score and Plan: Treatment may vary due to age or medical condition  Airway Management Planned: Natural Airway  Additional Equipment:   Intra-op Plan:   Post-operative Plan:   Informed Consent: I have reviewed the patients History and Physical, chart, labs and discussed the procedure including the risks, benefits and alternatives for the proposed anesthesia with the patient or authorized representative who has indicated his/her understanding and acceptance.       Plan Discussed with: CRNA  Anesthesia Plan Comments:         Anesthesia Quick Evaluation

## 2019-01-24 NOTE — Discharge Summary (Signed)
Postpartum Discharge Summary     Patient Name: Stephanie Cook DOB: [birth year] MRN: [id number]  Date of admission: 01/24/2019 Delivering Provider: Standley BrookingKELLOGG, ALEXANDRA L   Date of discharge: 01/26/2019  Admitting diagnosis: 33wks labor Intrauterine pregnancy: 4522w5d     Secondary diagnosis:  Principal Problem:   Preterm labor Active Problems:   H/O preterm delivery, currently pregnant   HSV-2 infection   Benign essential hypertension, antepartum   Depression   Supervision of high risk pregnancy, antepartum  Additional problems: CHTN, on Norvasc     Discharge diagnosis: Preterm Pregnancy Delivered                                                                                                Post partum procedures:n/a  Augmentation: AROM at delivery  Complications: manual removal of placenta d/t cord avulsion; probable abruption  Hospital course:  Onset of Labor With Vaginal Delivery     33 y.o. yo [id number] at 6722w5d was admitted in Latent Labor on 01/24/2019. She had heavy vaginal bleeding and ctx q 1-2 minutes.  Progressed over just a few hours to delivery.  Membrane Rupture Time/Date: 4:52 AM ,01/24/2019   Intrapartum Procedures: Episiotomy: None [1]                                         Lacerations:  1st degree [2];Periurethral [8]  Patient had a delivery of a Viable infant. 01/24/2019  Information for the patient's newborn:  Rudene AndaWilliams, Girl Caniyah [[id number]][[id number]]  Delivery Method: Vaginal, Spontaneous(Filed from Delivery Summary)    Pateint had an uncomplicated postpartum course. BP well controlled on norvasc. She is ambulating, tolerating a regular diet, passing flatus, and urinating well. Patient is discharged home in stable condition on 01/26/19.   Magnesium Sulfate recieved: No BMZ received: Yes  Physical exam  Vitals:   01/25/19 0422 01/25/19 0803 01/25/19 1553 01/25/19 2323  BP: (!) 150/97 (!) 136/103 119/80 (!) 135/98  Pulse: 65 72 84 71  Resp: 16 18 17 16    Temp: 98 F (36.7 C) 98.4 F (36.9 C) 98.1 F (36.7 C) 98 F (36.7 C)  TempSrc: Oral Oral Oral   SpO2: 100% 100% 97% 100%  Height:       General: alert, cooperative and no distress Lochia: appropriate Uterine Fundus: firm Incision: N/A DVT Evaluation: No evidence of DVT seen on physical exam. No significant calf/ankle edema. Labs: Lab Results  Component Value Date   WBC 11.4 (H) 01/24/2019   HGB 11.0 (L) 01/24/2019   HCT 33.8 (L) 01/24/2019   MCV 86.0 01/24/2019   PLT 223 01/24/2019   CMP Latest Ref Rng & Units 09/02/2018  Glucose 70 - 99 mg/dL 86  BUN 6 - 20 mg/dL 7  Creatinine 1.300.44 - 8.651.00 mg/dL 7.840.50  Sodium 696135 - 295145 mmol/L 137  Potassium 3.5 - 5.1 mmol/L 3.9  Chloride 98 - 111 mmol/L 106  CO2 22 - 32 mmol/L 22  Calcium 8.9 - 10.3 mg/dL 8.9  Total Protein 6.5 -  8.1 g/dL 7.1  Total Bilirubin 0.3 - 1.2 mg/dL 0.6  Alkaline Phos 38 - 126 U/L 44  AST 15 - 41 U/L 19  ALT 0 - 44 U/L 21    Discharge instruction: per After Visit Summary and "Baby and Me Booklet".  After visit meds:  Allergies as of 01/26/2019      Reactions   Motrin [ibuprofen] Hives      Medication List    STOP taking these medications   aspirin EC 81 MG tablet   methyldopa 250 MG tablet Commonly known as:  ALDOMET     TAKE these medications   albuterol 108 (90 Base) MCG/ACT inhaler Commonly known as:  PROVENTIL HFA;VENTOLIN HFA Inhale 2 puffs into the lungs every 6 (six) hours as needed for wheezing or shortness of breath.   amLODipine 10 MG tablet Commonly known as:  NORVASC Take 1 tablet (10 mg total) by mouth daily.   oxyCODONE-acetaminophen 5-325 MG tablet Commonly known as:  PERCOCET/ROXICET Take 1 tablet by mouth every 6 (six) hours as needed for moderate pain or severe pain.   PRENATAL GUMMIES/DHA & FA PO Take by mouth.       Diet: routine diet  Activity: Advance as tolerated. Pelvic rest for 6 weeks.   Outpatient follow up:2 weeks Follow up Appt: No future  appointments. Follow up Visit: Follow-up Information    [facility]. Schedule an appointment as soon as possible for a visit.   Specialty:  Obstetrics and Gynecology Why:  for blood pressure check on Wed/Thurs Contact information: [address] [place] [address] [phone]           Please schedule this patient for Postpartum visit in: 4 weeks with the following provider: Any provider For C/S patients schedule nurse incision check in weeks 2 weeks:  High risk pregnancy complicated by: HTN Delivery mode:  SVD Anticipated Birth Control:  none PP Procedures needed: BP check in 1 week Schedule Integrated BH visit: no    Newborn Data: Live born female  Birth Weight:   APGAR: ,   Newborn Delivery   Birth date/time:  01/24/2019 04:59:00 Delivery type:  Vaginal, Spontaneous     Baby Feeding: Breast Disposition:NICU   01/26/2019 Conan Bowens, MD

## 2019-01-24 NOTE — Anesthesia Procedure Notes (Signed)
Epidural Patient location during procedure: OB Start time: 01/24/2019 3:55 AM End time: 01/24/2019 3:58 AM  Staffing Anesthesiologist: Kaylyn Layer, MD Performed: anesthesiologist   Preanesthetic Checklist Completed: patient identified, pre-op evaluation, timeout performed, IV checked, risks and benefits discussed and monitors and equipment checked  Epidural Patient position: sitting Prep: site prepped and draped and DuraPrep Patient monitoring: continuous pulse ox, blood pressure, heart rate and cardiac monitor Approach: midline Location: L3-L4 Injection technique: LOR air  Needle:  Needle type: Tuohy  Needle gauge: 17 G Needle length: 9 cm Catheter type: closed end flexible Catheter size: 19 Gauge Catheter at skin depth: 11 cm Test dose: negative and Other (1% lidocaine)  Assessment Events: blood not aspirated, injection not painful, no injection resistance, negative IV test and no paresthesia  Additional Notes Patient identified. Risks, benefits, and alternatives discussed with patient including but not limited to bleeding, infection, nerve damage, paralysis, failed block, incomplete pain control, headache, blood pressure changes, nausea, vomiting, reactions to medication, itching, and postpartum back pain. Confirmed with bedside nurse the patient's most recent platelet count. Confirmed with patient that they are not currently taking any anticoagulation, have any bleeding history, or any family history of bleeding disorders. Patient expressed understanding and wished to proceed. All questions were answered. Sterile technique was used throughout the entire procedure. Crisp LOR on first pass. Please see nursing notes for vital signs. Test dose was given through epidural catheter and negative prior to continuing to dose epidural or start infusion. Warning signs of high block given to the patient including shortness of breath, tingling/numbness in hands, complete motor block, or any  concerning symptoms with instructions to call for help. Patient was given instructions on fall risk and not to get out of bed. All questions and concerns addressed with instructions to call with any issues or inadequate analgesia.  Reason for block:procedure for pain

## 2019-01-24 NOTE — MAU Provider Note (Signed)
Chief Complaint:  No chief complaint on file.   First Provider Initiated Contact with Patient 01/24/19 0108     HPI: Stephanie Cook is a 33 y.o. [id number] at 3833w5dwho presents to maternity admissions reporting leaking of fluid since midnight.  States has been contracting for 2 weeks but was not checked yesterday.. She reports good fetal movement, denies vaginal itching/burning, urinary symptoms, h/a, dizziness, n/v, diarrhea, constipation or fever/chills.    Pregnancy has been remarkable for chronic hypertension  Vaginal Bleeding  The patient's primary symptoms include pelvic pain and vaginal bleeding. The patient's pertinent negatives include no genital itching, genital lesions or genital odor. This is a new problem. The current episode started today. The problem occurs constantly. The problem has been unchanged. She is pregnant. Associated symptoms include abdominal pain. Pertinent negatives include no constipation, diarrhea or fever. The vaginal discharge was bloody. The vaginal bleeding is typical of menses. She has not been passing clots. She has not been passing tissue. Nothing aggravates the symptoms. She has tried nothing for the symptoms.    RN Note: PT ARRIVED VIA [facility] AT 12MN.   [facility].  HAS BEEN UC'S X2 WEEKS .    ON ARRIVAL-  DRIED  BLOOD ON THIGHS, FHR- 140.      SAYS HX OF HSV- DENIES OUTBREAK DURING PREG.   Marland Kitchen.  LAST SEX-  NOT THIS WEEK   Past Medical History: Past Medical History:  Diagnosis Date  . Depression   . Frequent urination 09/28/2015  . HSV-2 (herpes simplex virus 2) infection   . Hx of chlamydia infection    multiple times  . Hx of gonorrhea   . Hx of trichomoniasis    recurrent, 4 times  . Hypertension   . Missed period 09/28/2015  . Pregnant   . Preterm labor   . STD (female)    chlamydia, trichomonas, gonorrhea, HPV, HSV    Past obstetric history: OB History  Gravida Para Term Preterm AB Living  6 4 2 2  1 4   SAB TAB Ectopic Multiple Live Births  1 0 0 0 4    # Outcome Date GA Lbr Len/2nd Weight Sex Delivery Anes PTL Lv  6 Current           5 Preterm 10/26/13 2062w3d 20:36 / 00:13 2870 g M Vag-Spont EPI N LIV     Birth Comments: Pregnancy complications: +HSV 2 on acyclovir, MJ use, Etoh use, on 17P (previous preterm delivery), smoker, hypertension on aldomet, h/o multiple chlaymdia infections, h/o GC. Depression.  4 SAB 12/2012 7889w0d            Birth Comments: System Generated. Please review and update pregnancy details.  3 Preterm 05/04/12 6791w0d  2211 g F Vag-Spont None Y LIV     Birth Comments: System Generated. Please review and update pregnancy details.  2 Term 07/25/09 37389w0d 03:30 2920 g F Vag-Spont EPI N LIV  1 Term 02/01/08 86389w0d 11:00 3033 g M Vag-Spont EPI N LIV    Past Surgical History: Past Surgical History:  Procedure Laterality Date  . NO PAST SURGERIES      Family History: Family History  Problem Relation Age of Onset  . Lupus Mother   . Cirrhosis Mother   . Hypertension Maternal Grandmother     Social History: Social History   Tobacco Use  . Smoking status: Former Smoker    Packs/day: 0.50    Years: 8.00  Pack years: 4.00    Types: Cigarettes  . Smokeless tobacco: Never Used  Substance Use Topics  . Alcohol use: Not Currently    Comment: occasionally  . Drug use: Not Currently    Types: Marijuana    Allergies:  Allergies  Allergen Reactions  . Motrin [Ibuprofen] Hives    Meds:  Medications Prior to Admission  Medication Sig Dispense Refill Last Dose  . albuterol (PROVENTIL HFA;VENTOLIN HFA) 108 (90 Base) MCG/ACT inhaler Inhale 2 puffs into the lungs every 6 (six) hours as needed for wheezing or shortness of breath. 1 Inhaler 2 Past Week at Unknown time  . aspirin EC 81 MG tablet Take 2 tablets (162 mg total) by mouth daily. 60 tablet 6 Past Week at Unknown time  . methyldopa (ALDOMET) 250 MG tablet Take 1 tablet (250 mg total) by mouth 2 (two)  times daily. 60 tablet 6 01/23/2019 at Unknown time  . acyclovir (ZOVIRAX) 400 MG tablet Take 1 tablet (400 mg total) by mouth 3 (three) times daily. 90 tablet 2 NONE YET  . amoxicillin (AMOXIL) 500 MG capsule Take 1 capsule (500 mg total) by mouth 3 (three) times daily. (Patient not taking: Reported on 01/23/2019) 30 capsule 0 Not Taking  . Prenatal MV-Min-FA-Omega-3 (PRENATAL GUMMIES/DHA & FA PO) Take by mouth.   Taking    I have reviewed patient's Past Medical Hx, Surgical Hx, Family Hx, Social Hx, medications and allergies.   ROS:  Review of Systems  Constitutional: Negative for fever.  Gastrointestinal: Positive for abdominal pain. Negative for constipation and diarrhea.  Genitourinary: Positive for pelvic pain and vaginal bleeding.   Other systems negative  Physical Exam   Patient Vitals for the past 24 hrs:  BP Temp Temp src Pulse Resp  01/24/19 0059 132/89 - Oral 79 20   Constitutional: Well-developed, well-nourished female in no acute distress.  Cardiovascular: normal rate and rhythm Respiratory: normal effort, clear to auscultation bilaterally GI: Abd soft, non-tender, gravid appropriate for gestational age.   No rebound or guarding. MS: Extremities nontender, no edema, normal ROM Neurologic: Alert and oriented x 4.  GU: Neg CVAT.  PELVIC EXAM: Moderate blood in vault.  Thin and mucous like.  No pooling or ferning however.  Unable to visualize cervix    Digital exam done >> 2cm  Dilation: 2 Effacement (%): 90 Station: -2 Presentation: Vertex  FHT:  Baseline 140 , moderate variability, accelerations present, no decelerations Contractions: q 2-3 mins Irregular     Labs: Results for orders placed or performed in visit on 01/23/19 (from the past 24 hour(s))  POC Urinalysis Dipstick OB     Status: Abnormal   Collection Time: 01/23/19  3:02 PM  Result Value Ref Range   Color, UA     Clarity, UA     Glucose, UA Negative Negative   Bilirubin, UA     Ketones, UA neg     Spec Grav, UA     Blood, UA neg    pH, UA     POC,PROTEIN,UA Negative Negative, Trace, Small (1+), Moderate (2+), Large (3+), 4+   Urobilinogen, UA     Nitrite, UA neg    Leukocytes, UA Large (3+) (A) Negative   Appearance     Odor     B/Positive/-- (08/22 1516)  Imaging:    MAU Course/MDM: I have ordered labs and usual labor orders.  Since she is bleeding moderately heavy[facility].  Will not  start on Magnesium sulfate since she is over 32 weeks.   Betamethasone series.  Will check [place] for AFI and placental evaluation due to bleeding NST reviewed, reactive.    Unable to do Amnisure due to bleeding  Assessment: Single intrauterine pregnancy at [redacted]w[redacted]d Preterm labor Vaginal bleeding in the third trimester Bulging membranes, negative Fern  Plan: Admit to [facility] Routine orders Betamethasone series [place] for AFI and placental evaluation MD to follow   Wynelle Bourgeois CNM, MSN Certified Nurse-Midwife 01/24/2019 1:09 AM

## 2019-01-25 LAB — GC/CHLAMYDIA PROBE AMP (~~LOC~~) NOT AT ARMC
Chlamydia: NEGATIVE
Neisseria Gonorrhea: NEGATIVE

## 2019-01-25 MED ORDER — OXYCODONE-ACETAMINOPHEN 5-325 MG PO TABS
1.0000 | ORAL_TABLET | Freq: Four times a day (QID) | ORAL | Status: DC | PRN
  Administered 2019-01-25 (×3): 2 via ORAL
  Administered 2019-01-26: 1 via ORAL
  Administered 2019-01-26: 2 via ORAL
  Filled 2019-01-25 (×5): qty 2

## 2019-01-25 NOTE — Lactation Note (Signed)
This note was copied from a baby's chart. Lactation Consultation Note  Patient Name: Stephanie Cook Date: 01/25/2019 Reason for consult: Follow-up assessment;NICU baby;Preterm <34wks  Visited with P4 Mom of baby in the NICU.  Baby 30 hrs old.  Mom has been pumping regularly and seeing glistening on nipples.  Reassured Mom that it would take time.   Encouraged pumping >8 times per 24 hrs. Baby has been STS on Mom in the NICU.    Mom has spoken to [facility] office.  Appt on 01/28/2019.  If Mom discharged on 01/27/2019, Mom knows she can obtain a [facility] loaner from [place].   Mom denies any questions.  Consult Status Consult Status: Follow-up Date: 01/26/19 Follow-up type: In-patient    [name] 01/25/2019, 11:17 AM

## 2019-01-25 NOTE — Progress Notes (Signed)
Post Partum Day 1 Subjective: No complaints, up ad lib, voiding, tolerating PO and + flatus. Baby doing well in NICU.   Objective: Blood pressure (!) 150/97, pulse 65, temperature 98 F (36.7 C), temperature source Oral, resp. rate 16, height 5\' 2"  (1.575 m), last menstrual period 06/02/2018, SpO2 100 %, unknown if currently breastfeeding.  Vitals:   01/24/19 1619 01/24/19 2023 01/24/19 2304 01/25/19 0422  BP: 136/89 131/86 112/72 (!) 150/97  Pulse: 83 75 89 65  Resp: 18 16 16 16   Temp: 98.1 F (36.7 C) 98 F (36.7 C) 98.5 F (36.9 C) 98 F (36.7 C)  TempSrc: Oral Oral  Oral  SpO2: 95% 99% 99% 100%  Height:        Physical Exam:  General: alert Lochia: appropriate Uterine Fundus: firm DVT Evaluation: No evidence of DVT seen on physical exam. Negative Homan's sign.  Recent Labs    01/24/19 0110  HGB 11.0*  HCT 33.8*    Assessment/Plan: Plan for discharge tomorrow, Breastfeeding and Contraception Undecided  Continue Norvasc 10 mg for BP control Routine postpartum care.   LOS: 1 day   Jaynie Collins, MD 01/25/2019, 6:34 AM

## 2019-01-26 ENCOUNTER — Other Ambulatory Visit: Payer: Self-pay | Admitting: Obstetrics and Gynecology

## 2019-01-26 MED ORDER — OXYCODONE-ACETAMINOPHEN 5-325 MG PO TABS: 1.0000 | ORAL_TABLET | Freq: Four times a day (QID) | ORAL | 0 refills | Status: DC | PRN

## 2019-01-26 MED ORDER — AMLODIPINE BESYLATE 10 MG PO TABS: 10.0000 mg | ORAL_TABLET | Freq: Every day | ORAL | 3 refills | Status: DC

## 2019-01-26 NOTE — Lactation Note (Signed)
This note was copied from a baby's chart. Lactation Consultation Note  Patient Name: Girl Aleia Morrobel Today's Date: 01/26/2019   Visited with Mom on day of her discharge.  Baby born at [redacted]w[redacted]d and is in NICU.  Mom will be getting a DEBP from [facility] office tomorrow, she has an appointment.  Mom declines a [employer] loaner from [place].  Mom has everything packed up, and plans to use hand pump.  Demonstrated how to use the pump parts as a manual double pump for a day until she obtain a DEBP.    Reviewed breast massage and hand expression.  Unable to express any colostrum right now.  Not sure how frequently Mom is pumping.  Reviewed importance of >8 times per 24 hrs to support a full milk supply.  Mom states baby is rooting around and trying to latch to breast.  Mom has large nipples and baby will need to grow to be able to handle a deeper latch to transfer adequate milk from breast.  Mom aware of NICU pumping rooms.  Reassured Mom that Lactation can support her in the NICU and after baby's discharge with an OP appointment.  Information on BFSG at new site on Wednesdays shared.  Engorgement prevention and treatment discussed.  Mom has Lactation phone number to call prn for concerns.    [name] 01/26/2019, 10:00 AM

## 2019-01-27 ENCOUNTER — Other Ambulatory Visit: Payer: Medicaid Other | Admitting: Advanced Practice Midwife

## 2019-01-30 ENCOUNTER — Other Ambulatory Visit: Payer: Medicaid Other | Admitting: Obstetrics and Gynecology

## 2019-01-31 ENCOUNTER — Other Ambulatory Visit: Payer: Self-pay

## 2019-01-31 ENCOUNTER — Ambulatory Visit (INDEPENDENT_AMBULATORY_CARE_PROVIDER_SITE_OTHER): Payer: Medicaid Other | Admitting: *Deleted

## 2019-01-31 ENCOUNTER — Encounter: Payer: Self-pay | Admitting: *Deleted

## 2019-01-31 VITALS — BP 143/97 | HR 83 | Ht 62.0 in | Wt 208.0 lb

## 2019-01-31 DIAGNOSIS — Z0130 Encounter for examination of blood pressure without abnormal findings: Secondary | ICD-10-CM

## 2019-01-31 NOTE — Progress Notes (Signed)
Pt here for BP check postpartum. Informed [name], CNM of BP reading. Advised to continue taking amlodipine and f/u for postpartum visit in 1 month.

## 2019-02-02 ENCOUNTER — Ambulatory Visit: Payer: Self-pay

## 2019-02-02 NOTE — Lactation Note (Signed)
This note was copied from a baby's chart. Lactation Consultation Note  Patient Name: Stephanie Cook [id number] Date: 02/02/2019 Reason for consult: Follow-up assessment;1st time breastfeeding;NICU baby  62 days old FT female who is being exclusively BF by her mother, she's a P2 but this is her first time BF. Mom's milk is already in and she's now pumping every 3 hours to try to synch it with baby's feedings. Mom reported getting about 4 ounces combined from both breasts in each pumping session. Mom got a cooler full of breastmilk to the NICU today, baby has been getting Similac 24 calorie formula and donor milk through her NS tube in the meantime.  NICU RN called LC for assistance, mom already nursing baby when entering the NICU pod but latch was somehow shallow. LC broke the latch and repositioned baby, this time she had a wider areolar latch and showed mom the key points for a deep latch. Once LC started doing breast compressions a few audible swallows were heard. When LC tried hand expression, mom was spraying milk. She wanted to switch baby to the other breast at the 8 minute mark but reminded mom that it's best to keep baby on the same breast to have the benefits of hindmilk not just foremilk till she's done with her feedings; and advised her to pump the other breast instead and to try it on the next feeding. Baby still nursing when exiting the NICU pod at the 23 minutes mark.  Discussed feeding plan with baby, mom understands that she'll still need to supplement baby with a bottle after feedings at the breast until baby is all caught up with oral developmental milestones in order to be able to fully empty the breast. She was also advised to follow up with baby's pediatrician regarding current feeding plan.  Feeding plan:  1. Encouraged mom to keep pumping every 3 hours and at least once at night 2. Mom will continue supplementing baby with her EBM or Similac 24 formula after feedings at the  breast according to supplementation guidelines 3. Mom aware to limit feedings at the breast to no more than 30 minutes at a time per feeding per LPI policy  Mom reported all questions and concerns were answered, she's aware of LC OP services and will contact PRN.  Maternal Data    Feeding Feeding Type: Breast Fed Nipple Type: Nfant Slow Flow (purple)  LATCH Score Latch: Repeated attempts needed to sustain latch, nipple held in mouth throughout feeding, stimulation needed to elicit sucking reflex.  Audible Swallowing: A few with stimulation(only with breast compressions)  Type of Nipple: Everted at rest and after stimulation  Comfort (Breast/Nipple): Soft / non-tender  Hold (Positioning): Assistance needed to correctly position infant at breast and maintain latch.  LATCH Score: 7  Interventions Interventions: Breast feeding basics reviewed;Assisted with latch;Breast massage;Hand express;Breast compression;Adjust position;Support pillows  Lactation Tools Discussed/Used     Consult Status Consult Status: PRN Follow-up type: In-patient    Kristi Hyer Venetia Constable 02/02/2019, 3:53 PM

## 2019-02-03 ENCOUNTER — Other Ambulatory Visit: Payer: Medicaid Other | Admitting: Women's Health

## 2019-02-06 ENCOUNTER — Other Ambulatory Visit: Payer: Medicaid Other | Admitting: Family Medicine

## 2019-02-10 ENCOUNTER — Other Ambulatory Visit: Payer: Medicaid Other | Admitting: Obstetrics and Gynecology

## 2019-02-13 ENCOUNTER — Other Ambulatory Visit: Payer: Medicaid Other

## 2019-02-13 ENCOUNTER — Encounter: Payer: Medicaid Other | Admitting: Obstetrics & Gynecology

## 2019-02-17 ENCOUNTER — Other Ambulatory Visit: Payer: Medicaid Other | Admitting: Obstetrics & Gynecology

## 2019-02-20 ENCOUNTER — Encounter: Payer: Medicaid Other | Admitting: Obstetrics & Gynecology

## 2019-02-20 ENCOUNTER — Other Ambulatory Visit: Payer: Medicaid Other

## 2019-02-24 ENCOUNTER — Other Ambulatory Visit: Payer: Medicaid Other | Admitting: Advanced Practice Midwife

## 2019-02-25 ENCOUNTER — Ambulatory Visit: Payer: Self-pay | Admitting: Family Medicine

## 2019-02-27 ENCOUNTER — Encounter: Payer: Medicaid Other | Admitting: Obstetrics and Gynecology

## 2019-02-27 ENCOUNTER — Other Ambulatory Visit: Payer: Medicaid Other

## 2019-02-28 ENCOUNTER — Ambulatory Visit: Payer: Self-pay | Admitting: Advanced Practice Midwife

## 2019-03-03 ENCOUNTER — Other Ambulatory Visit: Payer: Medicaid Other | Admitting: Women's Health

## 2019-03-03 ENCOUNTER — Ambulatory Visit: Payer: Self-pay | Admitting: Family Medicine

## 2019-03-05 ENCOUNTER — Other Ambulatory Visit: Payer: Self-pay

## 2019-03-05 ENCOUNTER — Ambulatory Visit (INDEPENDENT_AMBULATORY_CARE_PROVIDER_SITE_OTHER): Payer: Medicaid Other | Admitting: Advanced Practice Midwife

## 2019-03-05 ENCOUNTER — Encounter: Payer: Self-pay | Admitting: Advanced Practice Midwife

## 2019-03-05 DIAGNOSIS — Z1389 Encounter for screening for other disorder: Secondary | ICD-10-CM

## 2019-03-05 NOTE — Progress Notes (Signed)
Stephanie Cook is a 33 y.o. who presents for a postpartum visit. She is 5 weeks postpartum following a spontaneous vaginal delivery. I have fully reviewed the prenatal and intrapartum course. The delivery was at 33 gestational weeks.  Anesthesia: epidural. Postpartum course has been uneventful. Baby's course has been uneventful. Baby is feeding by bottle. Bleeding bled about a week postpartum: started period yesterday. Bowel function is normal. Bladder function is normal. Patient is not sexually active. Contraception method is abstinence. Postpartum depression screening: negative.   Current Outpatient Medications:  .  albuterol (PROVENTIL HFA;VENTOLIN HFA) 108 (90 Base) MCG/ACT inhaler, Inhale 2 puffs into the lungs every 6 (six) hours as needed for wheezing or shortness of breath., Disp: 1 Inhaler, Rfl: 2 .  amLODipine (NORVASC) 10 MG tablet, Take 1 tablet (10 mg total) by mouth daily., Disp: 30 tablet, Rfl: 3 .  Prenatal MV-Min-FA-Omega-3 (PRENATAL GUMMIES/DHA & FA PO), Take by mouth., Disp: , Rfl:   Review of Systems   Constitutional: Negative for fever and chills Eyes: Negative for visual disturbances Respiratory: Negative for shortness of breath, dyspnea Cardiovascular: Negative for chest pain or palpitations  Gastrointestinal: Negative for vomiting, diarrhea and constipation Genitourinary: Negative for dysuria and urgency Musculoskeletal: Negative for back pain, joint pain, myalgias  Neurological: Negative for dizziness and headaches    Objective:     Vitals:   03/05/19 1519  BP: 119/84  Pulse: 87   General:  alert, cooperative and no distress   Breasts:  negative  Lungs: Normal respiratory effort  Heart:  regular rate and rhythm  Abdomen: Soft, nontender   Vulva: Declined pelvic d/t being on period    Vagina:   Cervix:    Corpus:      Rectal Exam: no hemorrhoids        Assessment:    normal postpartum exam.  Plan:   1. Contraception: abstinence 2. Follow up  in:  In June for Pap or as needed.

## 2019-03-06 ENCOUNTER — Encounter: Payer: Medicaid Other | Admitting: Obstetrics and Gynecology

## 2019-03-06 ENCOUNTER — Other Ambulatory Visit: Payer: Medicaid Other

## 2019-04-23 ENCOUNTER — Other Ambulatory Visit: Payer: Self-pay | Admitting: Advanced Practice Midwife

## 2019-04-30 ENCOUNTER — Encounter: Payer: Self-pay | Admitting: Obstetrics & Gynecology

## 2019-05-19 ENCOUNTER — Telehealth: Payer: Self-pay | Admitting: Advanced Practice Midwife

## 2019-05-19 ENCOUNTER — Other Ambulatory Visit: Payer: Self-pay | Admitting: Women's Health

## 2019-05-19 MED ORDER — ALBUTEROL SULFATE HFA 108 (90 BASE) MCG/ACT IN AERS: 2.0000 | INHALATION_SPRAY | Freq: Four times a day (QID) | RESPIRATORY_TRACT | 2 refills | Status: DC | PRN

## 2019-05-19 NOTE — Telephone Encounter (Signed)
Pt requesting a refill of her albuterol (PROVENTIL HFA;VENTOLIN HFA) 108 (90 Base) MCG/ACT inhaler

## 2019-05-21 ENCOUNTER — Other Ambulatory Visit: Payer: Self-pay | Admitting: Obstetrics and Gynecology

## 2019-05-26 ENCOUNTER — Encounter: Payer: Self-pay | Admitting: *Deleted

## 2019-05-27 ENCOUNTER — Other Ambulatory Visit: Payer: Medicaid Other | Admitting: Obstetrics & Gynecology

## 2019-09-05 ENCOUNTER — Emergency Department (HOSPITAL_COMMUNITY)
Admission: EM | Admit: 2019-09-05 | Discharge: 2019-09-05 | Disposition: A | Discharge status: Home / Self Care | Payer: Medicaid Other | Attending: Emergency Medicine | Admitting: Emergency Medicine

## 2019-09-05 ENCOUNTER — Emergency Department (HOSPITAL_COMMUNITY): Payer: Medicaid Other

## 2019-09-05 ENCOUNTER — Encounter (HOSPITAL_COMMUNITY): Payer: Self-pay

## 2019-09-05 ENCOUNTER — Other Ambulatory Visit: Payer: Self-pay

## 2019-09-05 DIAGNOSIS — I10 Essential (primary) hypertension: Secondary | ICD-10-CM | POA: Insufficient documentation

## 2019-09-05 DIAGNOSIS — X509XXA Other and unspecified overexertion or strenuous movements or postures, initial encounter: Secondary | ICD-10-CM | POA: Diagnosis not present

## 2019-09-05 DIAGNOSIS — Y939 Activity, unspecified: Secondary | ICD-10-CM | POA: Insufficient documentation

## 2019-09-05 DIAGNOSIS — Z87891 Personal history of nicotine dependence: Secondary | ICD-10-CM | POA: Insufficient documentation

## 2019-09-05 DIAGNOSIS — Y999 Unspecified external cause status: Secondary | ICD-10-CM | POA: Diagnosis not present

## 2019-09-05 DIAGNOSIS — Y929 Unspecified place or not applicable: Secondary | ICD-10-CM | POA: Diagnosis not present

## 2019-09-05 DIAGNOSIS — S39012A Strain of muscle, fascia and tendon of lower back, initial encounter: Secondary | ICD-10-CM | POA: Insufficient documentation

## 2019-09-05 DIAGNOSIS — S3992XA Unspecified injury of lower back, initial encounter: Secondary | ICD-10-CM | POA: Diagnosis present

## 2019-09-05 LAB — URINALYSIS, ROUTINE W REFLEX MICROSCOPIC
Bilirubin Urine: NEGATIVE
Glucose, UA: NEGATIVE mg/dL
Hgb urine dipstick: NEGATIVE
Ketones, ur: NEGATIVE mg/dL
Leukocytes,Ua: NEGATIVE
Nitrite: NEGATIVE
Protein, ur: NEGATIVE mg/dL
Specific Gravity, Urine: 1.03 (ref 1.005–1.030)
pH: 5 (ref 5.0–8.0)

## 2019-09-05 LAB — POC URINE PREG, ED: Preg Test, Ur: NEGATIVE

## 2019-09-05 MED ORDER — METHOCARBAMOL 750 MG PO TABS: 750.0000 mg | ORAL_TABLET | Freq: Three times a day (TID) | ORAL | 0 refills | Status: AC | PRN

## 2019-09-05 MED ORDER — HYDROCODONE-ACETAMINOPHEN 5-325 MG PO TABS: 1.0000 | ORAL_TABLET | Freq: Four times a day (QID) | ORAL | 0 refills | Status: DC | PRN

## 2019-09-05 MED ORDER — HYDROCODONE-ACETAMINOPHEN 5-325 MG PO TABS
1.0000 | ORAL_TABLET | Freq: Once | ORAL | Status: AC
  Administered 2019-09-05: 1 via ORAL
  Filled 2019-09-05: qty 1

## 2019-09-05 MED ORDER — NAPROXEN 500 MG PO TABS: 500.0000 mg | ORAL_TABLET | Freq: Two times a day (BID) | ORAL | 0 refills | Status: DC

## 2019-09-05 NOTE — ED Triage Notes (Signed)
Pt c/o pain in mid to lower back on r side since last night.  Denies injury.  Denies urinary symptoms.

## 2019-09-05 NOTE — Discharge Instructions (Addendum)
Take the medicines as prescribed.  Use the the other medicines as directed.  Do not drive within 4 hours of taking hydrocodone as this will make you drowsy.  Avoid lifting,  Bending,  Twisting or any other activity that worsens your pain over the next week.  Apply an  icepack  to your lower back for 10-15 minutes every 2 hours for the next 2 days.  You should get rechecked if your symptoms are not better over the next 7-10 days,  Or you develop increased pain,  Weakness in your leg(s) or loss of bladder or bowel function - these are symptoms of a worsening condition.

## 2019-09-05 NOTE — ED Provider Notes (Signed)
[facility] EMERGENCY DEPARTMENT Provider Note   CSN: [id number] Arrival date & time: 09/05/19  1009     History   Chief Complaint Chief Complaint  Patient presents with  . Back Pain    HPI Stephanie Cook is a 33 y.o. female with a history of depression, HTN, currently 7 months post partum (not breast feeding) presenting with midline lumbar back pain since the end of her shift at [employer] yesterday.  She denies any specific injury but is active at work with frequent need of lifting and straining her back. Denies prior low back pain.  She has no radiation of pain, no weakness or numbness in her legs, also no urinary or fecal incontinence, denies fevers, chills, urinary frequency and no hematuria.  Unable to describe pain, constant and worsened with movement, especially bending to put her pants on this morning.  She has had no treatment prior to arrival.      The history is provided by the patient.    Past Medical History:  Diagnosis Date  . Depression   . Frequent urination 09/28/2015  . HSV-2 (herpes simplex virus 2) infection   . Hx of chlamydia infection    multiple times  . Hx of gonorrhea   . Hx of trichomoniasis    recurrent, 4 times  . Hypertension   . Missed period 09/28/2015  . Pregnant   . Preterm labor   . STD (female)    chlamydia, trichomonas, gonorrhea, HPV, HSV    Patient Active Problem List   Diagnosis Date Noted  . Preterm labor 01/24/2019  . Supervision of high risk pregnancy, antepartum 08/08/2018  . Essential hypertension, benign 08/01/2016  . Obesity 08/01/2016  . Cigarette nicotine dependence without complication 08/01/2016  . Major depressive disorder, single episode, severe, specified as with psychotic behavior 03/12/2014  . 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 Mood disorder, drug-induced (HCC) 03/11/2014  . Depression 11/25/2013  . Benign essential hypertension, antepartum  06/11/2013  . H/O preterm delivery, currently pregnant 03/18/2013  . HSV-2 infection 03/18/2013    Past Surgical History:  Procedure Laterality Date  . NO PAST SURGERIES       OB History    Gravida  6   Para  5   Term  2   Preterm  3   AB  1   Living  5     SAB  1   TAB  0   Ectopic  0   Multiple  0   Live Births  5            Home Medications    Prior to Admission medications   Medication Sig Start Date End Date Taking? Authorizing Provider  albuterol (VENTOLIN HFA) 108 (90 Base) MCG/ACT inhaler Inhale 2 puffs into the lungs every 6 (six) hours as needed for wheezing or shortness of breath. 05/19/19   Cheral MarkerBooker, Kimberly R, CNM  amLODipine (NORVASC) 10 MG tablet Take 1 tablet (10 mg total) by mouth daily. 01/26/19   Hermina StaggersErvin, Michael L, MD  HYDROcodone-acetaminophen (NORCO/VICODIN) 5-325 MG tablet Take 1 tablet by mouth every 6 (six) hours as needed. 09/05/19   Burgess AmorIdol, Lajuan Godbee, PA-C  methocarbamol (ROBAXIN-750) 750 MG tablet Take 1 tablet (750 mg total) by mouth every 8 (eight) hours as needed for up to 5 days for muscle spasms. 09/05/19 09/10/19  Burgess AmorIdol, Aizley Stenseth, PA-C  naproxen (NAPROSYN) 500 MG tablet Take 1 tablet (500 mg total) by mouth 2 (two)  times daily. 09/05/19   Burgess Amor, PA-C  Prenatal MV-Min-FA-Omega-3 (PRENATAL GUMMIES/DHA & FA PO) Take by mouth.    [provider]    Family History Family History  Problem Relation Age of Onset  . Lupus Mother   . Cirrhosis Mother   . Hypertension Maternal Grandmother     Social History Social History   Tobacco Use  . Smoking status: Former Smoker    Packs/day: 0.50    Years: 8.00    Pack years: 4.00    Types: Cigarettes  . Smokeless tobacco: Never Used  Substance Use Topics  . Alcohol use: Not Currently    Comment: occasionally  . Drug use: Not Currently     Allergies   Motrin [ibuprofen] and Tramadol hcl   Review of Systems Review of Systems  Constitutional: Negative for fever.  Respiratory:  Negative for shortness of breath.   Cardiovascular: Negative for chest pain and leg swelling.  Gastrointestinal: Negative for abdominal distention, abdominal pain and constipation.  Genitourinary: Negative for difficulty urinating, dysuria, flank pain, frequency and urgency.  Musculoskeletal: Positive for back pain. Negative for gait problem and joint swelling.  Skin: Negative for rash.  Neurological: Negative for weakness and numbness.     Physical Exam Updated Vital Signs BP (!) 127/99 (BP Location: Right Arm)   Pulse 65   Temp 98.9 F (37.2 C) (Oral)   Resp 14   Ht 5\' 2"  (1.575 m)   Wt 68 kg   LMP 08/12/2019   SpO2 100%   Breastfeeding No Comment: neg preg test 9/18  BMI 27.44 kg/m   Physical Exam Vitals signs and nursing note reviewed.  Constitutional:      Appearance: She is well-developed.  HENT:     Head: Normocephalic.  Eyes:     Conjunctiva/sclera: Conjunctivae normal.  Neck:     Musculoskeletal: Normal range of motion and neck supple.  Cardiovascular:     Rate and Rhythm: Normal rate.     Comments: Pedal pulses normal. Pulmonary:     Effort: Pulmonary effort is normal.  Abdominal:     General: Bowel sounds are normal. There is no distension.     Palpations: Abdomen is soft. There is no mass.  Musculoskeletal: Normal range of motion.     Lumbar back: She exhibits bony tenderness. She exhibits no swelling, no edema and no spasm.       Back:     Comments: Bilateral midline ttp   Skin:    General: Skin is warm and dry.  Neurological:     Mental Status: She is alert.     Sensory: No sensory deficit.     Motor: No tremor or atrophy.     Gait: Gait normal.     Deep Tendon Reflexes:     Reflex Scores:      Patellar reflexes are 2+ on the right side and 2+ on the left side.      Achilles reflexes are 2+ on the right side and 2+ on the left side.    Comments: No strength deficit noted in hip and knee flexor and extensor muscle groups.  Ankle flexion and  extension intact.      ED Treatments / Results  Labs (all labs ordered are listed, but only abnormal results are displayed) Labs Reviewed  URINALYSIS, ROUTINE W REFLEX MICROSCOPIC - Abnormal; Notable for the following components:      Result Value   APPearance HAZY (*)    All other components within  normal limits  POC URINE PREG, ED    EKG None  Radiology Dg Lumbar Spine Complete  Result Date: 09/05/2019 CLINICAL DATA:  Low back pain since last night.  No injury. EXAM: LUMBAR SPINE - COMPLETE 4+ VIEW COMPARISON:  None. FINDINGS: Vertebral body alignment, heights and disc space heights are normal. No evidence of compression fracture or spondylolisthesis. IMPRESSION: Negative. Electronically Signed   By: Marin Olp M.D.   On: 09/05/2019 11:59    Procedures Procedures (including critical care time)  Medications Ordered in ED Medications  HYDROcodone-acetaminophen (NORCO/VICODIN) 5-325 MG per tablet 1 tablet (1 tablet Oral Given 09/05/19 1113)     Initial Impression / Assessment and Plan / ED Course  I have reviewed the triage vital signs and the nursing notes.  Pertinent labs & imaging results that were available during my care of the patient were reviewed by me and considered in my medical decision making (see chart for details).        No neuro deficit on exam or by history to suggest emergent or surgical presentation.  Also discussed worsened sx that should prompt immediate re-evaluation including distal weakness, bowel/bladder retention/incontinence.  Pt with reproducible low back pain, no localizing midline ttp. No edema, no risk factors for infection.  Exam and hx most c/w with benign mechanical back pain.  Advised home tx including heat, mechanical movements to avoid and to help heal sx.  Plan f/u with pcp for further eval/management if not improved over the next 7-10 days.  Imaging reviewed and discussed with pt.   Final Clinical Impressions(s) / ED Diagnoses    Final diagnoses:  Strain of lumbar region, initial encounter    ED Discharge Orders         Ordered    methocarbamol (ROBAXIN-750) 750 MG tablet  Every 8 hours PRN     09/05/19 1202    HYDROcodone-acetaminophen (NORCO/VICODIN) 5-325 MG tablet  Every 6 hours PRN     09/05/19 1202    naproxen (NAPROSYN) 500 MG tablet  2 times daily     09/05/19 1202           Evalee Jefferson, PA-C 09/05/19 1324    Long, Wonda Olds, MD 09/05/19 1953

## 2020-03-11 IMAGING — DX DG LUMBAR SPINE COMPLETE 4+V
5 series · 5 of 5 positions shown · non-contrast
Comparison: None.

CLINICAL DATA: Low back pain since last night.  No injury.

EXAM:
LUMBAR SPINE - COMPLETE 4+ VIEW

[l-spine ap]
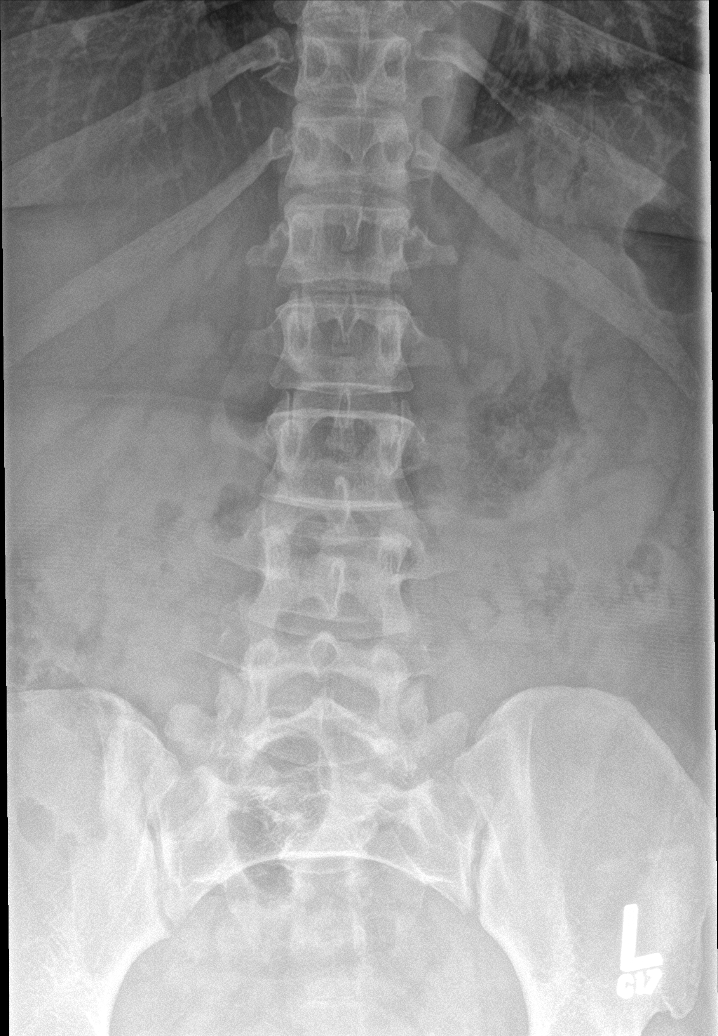

[l-spine obl (1 of 2)]
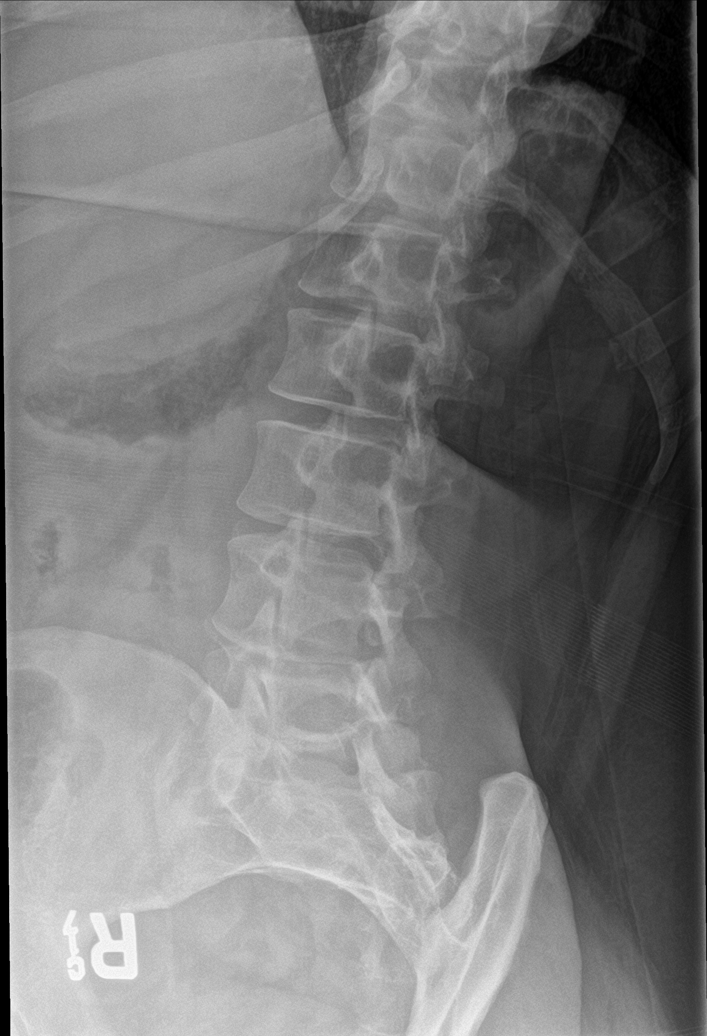

[l-spine obl (2 of 2)]
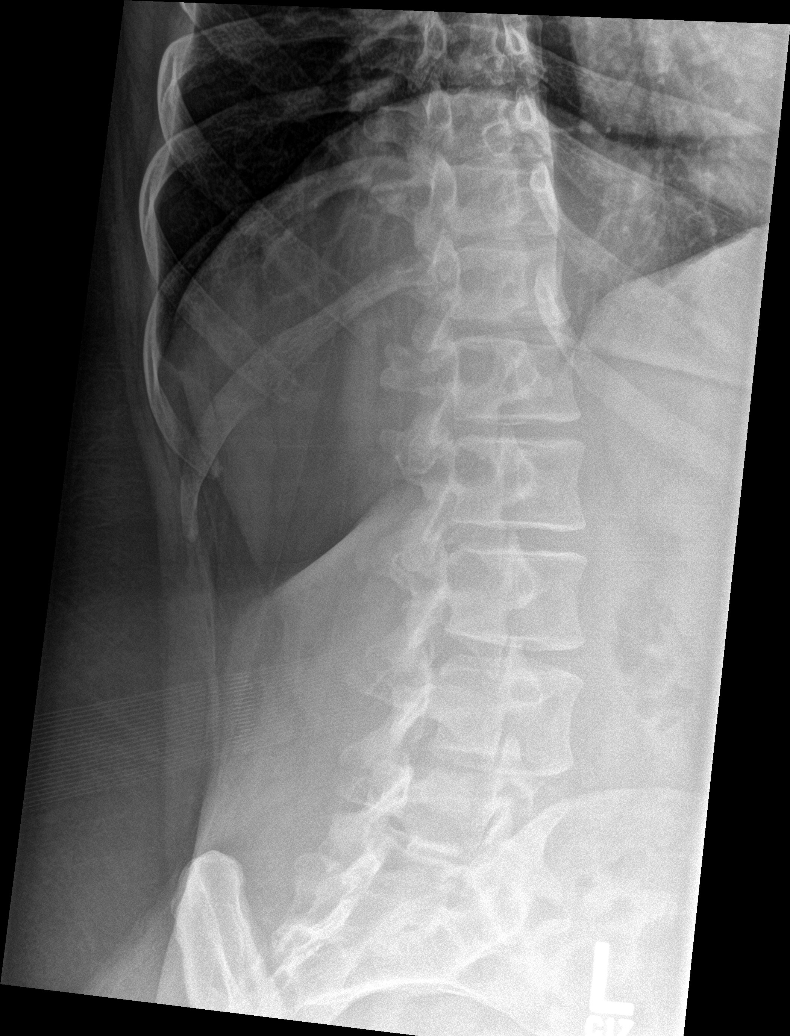

[l-spine lat]
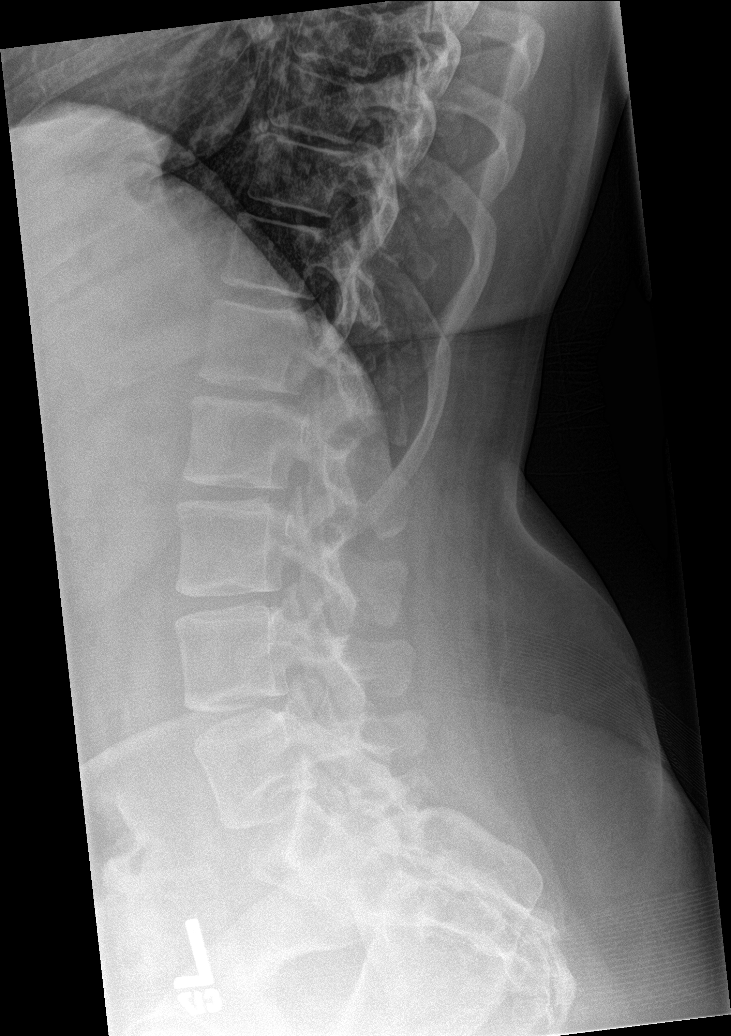

[l-spine spot]
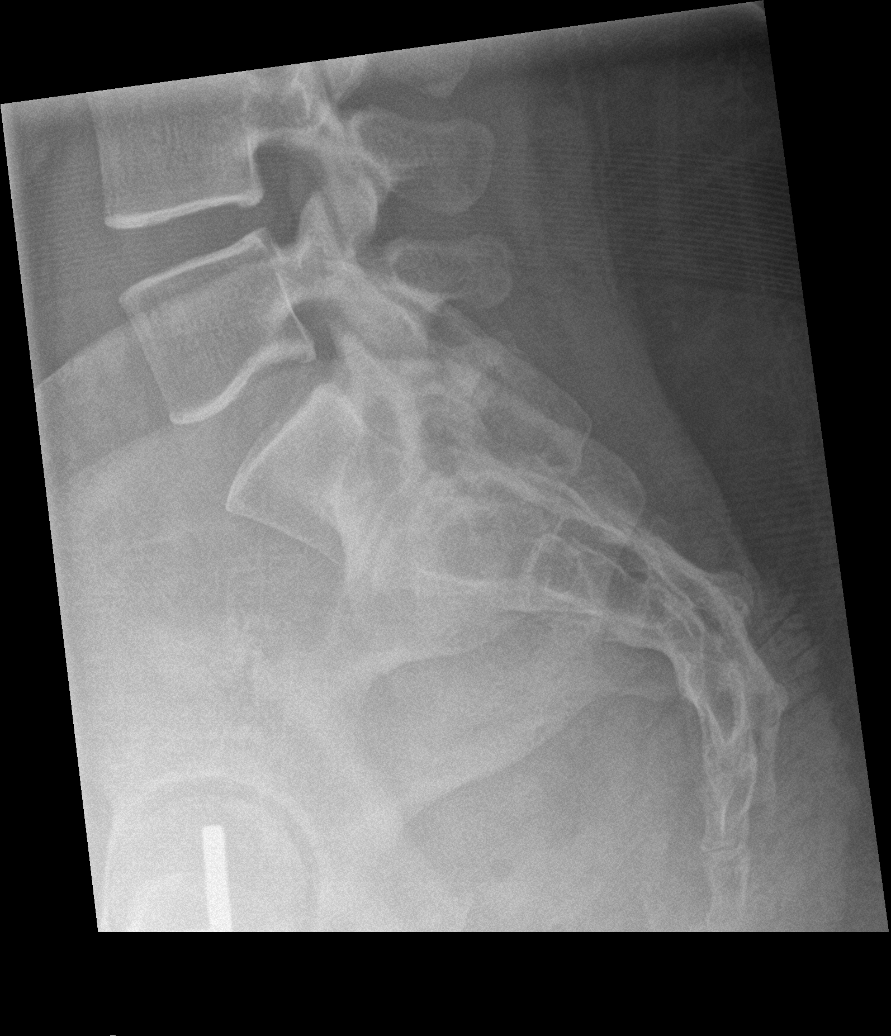

[5 of 5 positions shown; findings below may reference images not displayed]

FINDINGS: Vertebral body alignment, heights and disc space heights are normal.
No evidence of compression fracture or spondylolisthesis.
IMPRESSION: Negative.

## 2020-05-11 ENCOUNTER — Ambulatory Visit (INDEPENDENT_AMBULATORY_CARE_PROVIDER_SITE_OTHER): Payer: Medicaid Other | Admitting: Adult Health

## 2020-05-11 ENCOUNTER — Encounter: Payer: Self-pay | Admitting: Adult Health

## 2020-05-11 ENCOUNTER — Other Ambulatory Visit (HOSPITAL_COMMUNITY)
Admission: RE | Admit: 2020-05-11 | Discharge: 2020-05-11 | Disposition: A | Discharge status: Home / Self Care | Payer: Medicaid Other | Source: Ambulatory Visit | Attending: Adult Health | Admitting: Adult Health

## 2020-05-11 VITALS — BP 139/101 | HR 79 | Ht 62.0 in | Wt 218.0 lb

## 2020-05-11 DIAGNOSIS — Z0000 Encounter for general adult medical examination without abnormal findings: Secondary | ICD-10-CM

## 2020-05-11 DIAGNOSIS — I10 Essential (primary) hypertension: Secondary | ICD-10-CM | POA: Diagnosis not present

## 2020-05-11 DIAGNOSIS — Z3202 Encounter for pregnancy test, result negative: Secondary | ICD-10-CM | POA: Insufficient documentation

## 2020-05-11 DIAGNOSIS — Z124 Encounter for screening for malignant neoplasm of cervix: Secondary | ICD-10-CM | POA: Insufficient documentation

## 2020-05-11 DIAGNOSIS — Z01419 Encounter for gynecological examination (general) (routine) without abnormal findings: Secondary | ICD-10-CM

## 2020-05-11 DIAGNOSIS — N920 Excessive and frequent menstruation with regular cycle: Secondary | ICD-10-CM | POA: Diagnosis not present

## 2020-05-11 DIAGNOSIS — N946 Dysmenorrhea, unspecified: Secondary | ICD-10-CM

## 2020-05-11 DIAGNOSIS — Z30013 Encounter for initial prescription of injectable contraceptive: Secondary | ICD-10-CM

## 2020-05-11 DIAGNOSIS — Z1272 Encounter for screening for malignant neoplasm of vagina: Secondary | ICD-10-CM

## 2020-05-11 DIAGNOSIS — Z13220 Encounter for screening for lipoid disorders: Secondary | ICD-10-CM | POA: Insufficient documentation

## 2020-05-11 LAB — POCT URINE PREGNANCY: Preg Test, Ur: NEGATIVE

## 2020-05-11 MED ORDER — LOSARTAN POTASSIUM-HCTZ 50-12.5 MG PO TABS: 1.0000 | ORAL_TABLET | Freq: Every day | ORAL | 6 refills | Status: DC

## 2020-05-11 MED ORDER — MEDROXYPROGESTERONE ACETATE 150 MG/ML IM SUSP: 150.0000 mg | INTRAMUSCULAR | 4 refills | Status: DC

## 2020-05-11 NOTE — Progress Notes (Signed)
Patient ID: Stephanie Cook, female   DOB: 1986/05/20, 34 y.o.   MRN: [id number] History of Present Illness:  Stephanie Cook is a 34 year old black female,married to female, [id number], in for a well woman gyn exam and pap. Last week BP up at dentist. PCP is [facility].   Current Medications, Allergies, Past Medical History, Past Surgical History, Family History and Social History were reviewed in [employer] record.     Review of Systems: Patient denies any hearing loss, fatigue, blurred vision, shortness of breath, chest pain, abdominal pain, problems with bowel movements, urination, or intercourse. No joint pain or mood swings. Has headaches and some dizziness when BP elevated has been on norvasc for years. Has bad cramps with periods and heavy for 3 days then over, may change pad every minutes at times, she wants depo   Physical Exam:BP (!) 139/101 (BP Location: Right Arm, Patient Position: Sitting, Cuff Size: Normal)   Pulse 79   Ht 5\' 2"  (1.575 m)   Wt 218 lb (98.9 kg)   LMP 05/07/2020 (Exact Date)   BMI 39.87 kg/m   UPT is negative. General:  Well developed, well nourished, no acute distress Skin:  Warm and dry Neck:  Midline trachea, normal thyroid, good ROM, no lymphadenopathy Lungs; Clear to auscultation bilaterally Breast:  No dominant palpable mass, retraction, or nipple discharge Cardiovascular: Regular rate and rhythm Abdomen:  Soft, non tender, no hepatosplenomegaly Pelvic:  External genitalia is normal in appearance, no lesions.  The vagina is normal in appearance. Urethra has no lesions or masses. The cervix is bulbous.Pap with GC/CHL and high risk HPV 16/18 genotyping performed  Uterus is felt to be normal size, shape, and contour.  No adnexal masses or tenderness noted.Bladder is non tender, no masses felt. Extremities/musculoskeletal:  No swelling or varicosities noted, no clubbing or cyanosis Psych:  No mood changes, alert and cooperative,seems happy AA  4 Fall risk is low PHQ 9 score is 1 Co exam with 05/09/2020 NP student.  Impression and Plan:  1. Routine cervical smear Pap sent   2. Urine pregnancy test negative  3. Encounter for gynecological examination with Papanicolaou smear of cervix Pap sent Physical in 1 year Pap in 3 if normal Check CBC,CMP,TSH and lipids  4. Hypertension, unspecified type Continue Norvasc Will add Hyzaar Meds ordered this encounter  Medications  . losartan-hydrochlorothiazide (HYZAAR) 50-12.5 MG tablet    Sig: Take 1 tablet by mouth daily.    Dispense:  30 tablet    Refill:  6    Order Specific Question:   Supervising Provider    Answer:   Richelle Ito, LUTHER H [[id number]]  . medroxyPROGESTERone (DEPO-PROVERA) 150 MG/ML injection    Sig: Inject 1 mL (150 mg total) into the muscle every 3 (three) months.    Dispense:  1 mL    Refill:  4    Order Specific Question:   Supervising Provider    Answer:   Despina Hidden, LUTHER H [[id number]]  Recheck BP in 3 weeks  She has follow up at Health dept 06/07/20 She says she eats very little sweets and does not add salt   5. Dysmenorrhea Will rx depo  6. Menorrhagia with regular cycle Will rx depo  7. Encounter for initial prescription of injectable contraceptive rx depo Return tomorrow for first injection

## 2020-05-12 ENCOUNTER — Ambulatory Visit (INDEPENDENT_AMBULATORY_CARE_PROVIDER_SITE_OTHER): Payer: Medicaid Other | Admitting: *Deleted

## 2020-05-12 DIAGNOSIS — Z3042 Encounter for surveillance of injectable contraceptive: Secondary | ICD-10-CM | POA: Diagnosis not present

## 2020-05-12 DIAGNOSIS — N946 Dysmenorrhea, unspecified: Secondary | ICD-10-CM

## 2020-05-12 LAB — TSH: TSH: 1.66 u[IU]/mL (ref 0.450–4.500)

## 2020-05-12 LAB — COMPREHENSIVE METABOLIC PANEL
ALT: 11 IU/L (ref 0–32)
AST: 13 IU/L (ref 0–40)
Albumin/Globulin Ratio: 1.2 (ref 1.2–2.2)
Albumin: 4.2 g/dL (ref 3.8–4.8)
Alkaline Phosphatase: 79 IU/L (ref 48–121)
BUN/Creatinine Ratio: 16 (ref 9–23)
BUN: 11 mg/dL (ref 6–20)
Bilirubin Total: 0.3 mg/dL (ref 0.0–1.2)
CO2: 20 mmol/L (ref 20–29)
Calcium: 9.5 mg/dL (ref 8.7–10.2)
Chloride: 102 mmol/L (ref 96–106)
Creatinine, Ser: 0.67 mg/dL (ref 0.57–1.00)
GFR calc Af Amer: 135 mL/min/{1.73_m2} (ref >59)
GFR calc non Af Amer: 118 mL/min/{1.73_m2} (ref >59)
Globulin, Total: 3.5 g/dL (ref 1.5–4.5)
Glucose: 98 mg/dL (ref 65–99)
Potassium: 4.5 mmol/L (ref 3.5–5.2)
Sodium: 137 mmol/L (ref 134–144)
Total Protein: 7.7 g/dL (ref 6.0–8.5)

## 2020-05-12 LAB — CBC
Hematocrit: 42.4 % (ref 34.0–46.6)
Hemoglobin: 14.1 g/dL (ref 11.1–15.9)
MCH: 28.2 pg (ref 26.6–33.0)
MCHC: 33.3 g/dL (ref 31.5–35.7)
MCV: 85 fL (ref 79–97)
Platelets: 270 10*3/uL (ref 150–450)
RBC: 5 x10E6/uL (ref 3.77–5.28)
RDW: 12.3 % (ref 11.7–15.4)
WBC: 11.2 10*3/uL — ABNORMAL HIGH (ref 3.4–10.8)

## 2020-05-12 LAB — LIPID PANEL
Chol/HDL Ratio: 2.8 ratio (ref 0.0–4.4)
Cholesterol, Total: 178 mg/dL (ref 100–199)
HDL: 63 mg/dL (ref >39)
LDL Chol Calc (NIH): 97 mg/dL (ref 0–99)
Triglycerides: 98 mg/dL (ref 0–149)
VLDL Cholesterol Cal: 18 mg/dL (ref 5–40)

## 2020-05-12 MED ORDER — MEDROXYPROGESTERONE ACETATE 150 MG/ML IM SUSP
150.0000 mg | Freq: Once | INTRAMUSCULAR | Status: AC
  Administered 2020-05-12: 150 mg via INTRAMUSCULAR

## 2020-05-12 NOTE — Progress Notes (Signed)
   NURSE VISIT- INJECTION  SUBJECTIVE:  Stephanie Cook is a 34 y.o. [phone] female here for a Depo Provera for contraception/period management. She is a GYN patient.   OBJECTIVE:  LMP 05/07/2020 (Exact Date)   Appears well, in no apparent distress  Injection administered in: Left gluteal  Meds ordered this encounter  Medications  . medroxyPROGESTERone (DEPO-PROVERA) injection 150 mg    ASSESSMENT: GYN patient Depo Provera for contraception/period management PLAN: Follow-up: in 11-13 weeks for next Depo   Annamarie Dawley  05/12/2020 10:48 AM

## 2020-05-13 LAB — CYTOLOGY - PAP
Chlamydia: NEGATIVE
Comment: NEGATIVE
Comment: NEGATIVE
Comment: NORMAL
Diagnosis: NEGATIVE
High risk HPV: NEGATIVE
Neisseria Gonorrhea: NEGATIVE

## 2020-08-04 ENCOUNTER — Ambulatory Visit (INDEPENDENT_AMBULATORY_CARE_PROVIDER_SITE_OTHER): Payer: Medicaid Other | Admitting: *Deleted

## 2020-08-04 ENCOUNTER — Other Ambulatory Visit: Payer: Self-pay

## 2020-08-04 DIAGNOSIS — Z3042 Encounter for surveillance of injectable contraceptive: Secondary | ICD-10-CM

## 2020-08-04 DIAGNOSIS — N946 Dysmenorrhea, unspecified: Secondary | ICD-10-CM | POA: Diagnosis not present

## 2020-08-04 MED ORDER — MEDROXYPROGESTERONE ACETATE 150 MG/ML IM SUSP
150.0000 mg | Freq: Once | INTRAMUSCULAR | Status: AC
  Administered 2020-08-04: 150 mg via INTRAMUSCULAR

## 2020-08-04 NOTE — Progress Notes (Signed)
   NURSE VISIT- INJECTION  SUBJECTIVE:  Stephanie Cook is a 34 y.o. [phone] female here for a Depo Provera for contraception/period management. She is a GYN patient.   OBJECTIVE:  There were no vitals taken for this visit.  Appears well, in no apparent distress  Injection administered in: Left upper quad. gluteus  Meds ordered this encounter  Medications  . medroxyPROGESTERone (DEPO-PROVERA) injection 150 mg    ASSESSMENT: GYN patient Depo Provera for contraception/period management PLAN: Follow-up: 11-13 weeks  Annamarie Dawley  08/04/2020 10:13 AM

## 2020-08-05 ENCOUNTER — Encounter: Payer: Self-pay | Admitting: Emergency Medicine

## 2020-08-05 ENCOUNTER — Other Ambulatory Visit: Payer: Self-pay

## 2020-08-05 ENCOUNTER — Ambulatory Visit
Admission: EM | Admit: 2020-08-05 | Discharge: 2020-08-05 | Disposition: A | Discharge status: Home / Self Care | Payer: Medicaid Other | Attending: Emergency Medicine | Admitting: Emergency Medicine

## 2020-08-05 DIAGNOSIS — Z1152 Encounter for screening for COVID-19: Secondary | ICD-10-CM

## 2020-08-05 DIAGNOSIS — J069 Acute upper respiratory infection, unspecified: Secondary | ICD-10-CM | POA: Diagnosis not present

## 2020-08-05 MED ORDER — CETIRIZINE HCL 10 MG PO TABS: 10.0000 mg | ORAL_TABLET | Freq: Every day | ORAL | 0 refills | Status: DC

## 2020-08-05 MED ORDER — PREDNISONE 10 MG PO TABS: 20.0000 mg | ORAL_TABLET | Freq: Every day | ORAL | 0 refills | Status: DC

## 2020-08-05 MED ORDER — FLUTICASONE PROPIONATE 50 MCG/ACT NA SUSP: 1.0000 | Freq: Every day | NASAL | 0 refills | Status: DC

## 2020-08-05 MED ORDER — BENZONATATE 100 MG PO CAPS: 100.0000 mg | ORAL_CAPSULE | Freq: Three times a day (TID) | ORAL | 0 refills | Status: DC

## 2020-08-05 NOTE — ED Triage Notes (Signed)
Sore throat, body aches started this morning.  Exposure to covid from family members

## 2020-08-05 NOTE — ED Provider Notes (Signed)
[facility] CARE CENTER   [id number] 08/05/20 Arrival Time: 1057   CC: COVID symptoms  SUBJECTIVE: History from: patient.  LINZI OHLINGER is a 34 y.o. female who presents to the urgent care for complaint of sore throat, body aches and cough that started this morning.  Reports she has been exposed to Covid..  Denies sick exposure to flu or strep.  Denies recent travel.  Has tried OTC medication without relief.  Denies alleviating factors.  Denies previous symptoms in the past.   Denies fever, chills, fatigue, sinus pain, rhinorrhea,  SOB, wheezing, chest pain, nausea, changes in bowel or bladder habits.     ROS: As per HPI.  All other pertinent ROS negative.     Past Medical History:  Diagnosis Date  . Depression   . Frequent urination 09/28/2015  . HSV-2 (herpes simplex virus 2) infection   . Hx of chlamydia infection    multiple times  . Hx of gonorrhea   . Hx of trichomoniasis    recurrent, 4 times  . Hypertension   . Missed period 09/28/2015  . Pregnant   . Preterm labor   . STD (female)    chlamydia, trichomonas, gonorrhea, HPV, HSV   Past Surgical History:  Procedure Laterality Date  . NO PAST SURGERIES     Allergies  Allergen Reactions  . Motrin [Ibuprofen] Hives  . Tramadol Hcl     headache   No current facility-administered medications on file prior to encounter.   Current Outpatient Medications on File Prior to Encounter  Medication Sig Dispense Refill  . acyclovir (ZOVIRAX) 400 MG tablet Take 400 mg by mouth 3 (three) times daily.    Marland Kitchen albuterol (VENTOLIN HFA) 108 (90 Base) MCG/ACT inhaler Inhale 2 puffs into the lungs every 6 (six) hours as needed for wheezing or shortness of breath. 1 Inhaler 2  . amLODipine (NORVASC) 10 MG tablet Take 1 tablet (10 mg total) by mouth daily. 30 tablet 3  . losartan-hydrochlorothiazide (HYZAAR) 50-12.5 MG tablet Take 1 tablet by mouth daily. 30 tablet 6  . medroxyPROGESTERone (DEPO-PROVERA) 150 MG/ML injection Inject 1  mL (150 mg total) into the muscle every 3 (three) months. 1 mL 4   Social History   Socioeconomic History  . Marital status: Married    Spouse name: lauren grady  . Number of children: 4  . Years of education: high school  . Highest education level: 9th grade  Occupational History  . Not on file  Tobacco Use  . Smoking status: Former Smoker    Packs/day: 0.50    Years: 8.00    Pack years: 4.00    Types: Cigarettes  . Smokeless tobacco: Never Used  Vaping Use  . Vaping Use: Never used  Substance and Sexual Activity  . Alcohol use: Not Currently    Comment: occasionally  . Drug use: Not Currently  . Sexual activity: Yes    Birth control/protection: None  Other Topics Concern  . Not on file  Social History Narrative  . Not on file   Social Determinants of Health   Financial Resource Strain: Low Risk   . Difficulty of Paying Living Expenses: Not hard at all  Food Insecurity: No Food Insecurity  . Worried About Programme researcher, broadcasting/film/video in the Last Year: Never true  . Ran Out of Food in the Last Year: Never true  Transportation Needs: No Transportation Needs  . Lack of Transportation (Medical): No  . Lack of Transportation (Non-Medical): No  Physical  Activity: Sufficiently Active  . Days of Exercise per Week: 2 days  . Minutes of Exercise per Session: 90 min  Stress: No Stress Concern Present  . Feeling of Stress : Only a little  Social Connections: Moderately Isolated  . Frequency of Communication with Friends and Family: More than three times a week  . Frequency of Social Gatherings with Friends and Family: Once a week  . Attends Religious Services: Never  . Active Member of Clubs or Organizations: No  . Attends Banker Meetings: Never  . Marital Status: Married  Catering manager Violence: Not At Risk  . Fear of Current or Ex-Partner: No  . Emotionally Abused: No  . Physically Abused: No  . Sexually Abused: No   Family History  Problem Relation Age of  Onset  . Lupus Mother   . Cirrhosis Mother   . Hypertension Maternal Grandmother     OBJECTIVE:  Vitals:   08/05/20 1110  BP: 122/88  Pulse: 86  Resp: 16  Temp: 98.3 F (36.8 C)  TempSrc: Oral  SpO2: 98%     General appearance: alert; appears fatigued, but nontoxic; speaking in full sentences and tolerating own secretions HEENT: NCAT; Ears: EACs clear, TMs pearly gray; Eyes: PERRL.  EOM grossly intact. Sinuses: nontender; Nose: nares patent without rhinorrhea, Throat: oropharynx clear, tonsils non erythematous or enlarged, uvula midline  Neck: supple without LAD Lungs: unlabored respirations, symmetrical air entry; cough: mild; no respiratory distress; CTAB Heart: regular rate and rhythm.  Radial pulses 2+ symmetrical bilaterally Skin: warm and dry Psychological: alert and cooperative; normal mood and affect  LABS:  No results found for this or any previous visit (from the past 24 hour(s)).   ASSESSMENT & PLAN:  1. Viral URI   2. Encounter for screening for COVID-19     Meds ordered this encounter  Medications  . cetirizine (ZYRTEC ALLERGY) 10 MG tablet    Sig: Take 1 tablet (10 mg total) by mouth daily.    Dispense:  30 tablet    Refill:  0  . fluticasone (FLONASE) 50 MCG/ACT nasal spray    Sig: Place 1 spray into both nostrils daily for 14 days.    Dispense:  16 g    Refill:  0  . benzonatate (TESSALON) 100 MG capsule    Sig: Take 1 capsule (100 mg total) by mouth every 8 (eight) hours.    Dispense:  30 capsule    Refill:  0  . predniSONE (DELTASONE) 10 MG tablet    Sig: Take 2 tablets (20 mg total) by mouth daily.    Dispense:  15 tablet    Refill:  0    Discharge Instructions   COVID testing ordered.  It will take between 2-7 days for test results.  Someone will contact you regarding abnormal results.    In the meantime: You should remain isolated in your home for 10 days from symptom onset AND greater than 24 hours after symptoms resolution (absence  of fever without the use of fever-reducing medication and improvement in respiratory symptoms), whichever is longer Get plenty of rest and push fluids Tessalon Perles prescribed for cough Zyrtec for nasal congestion, runny nose, and/or sore throat Flonase for nasal congestion and runny nose Low-dose prednisone was prescribed Use medications daily for symptom relief Use OTC medications like ibuprofen or tylenol as needed fever or pain Call or go to the ED if you have any new or worsening symptoms such as fever, worsening cough, shortness  of breath, chest tightness, chest pain, turning blue, changes in mental status, etc...   Reviewed expectations re: course of current medical issues. Questions answered. Outlined signs and symptoms indicating need for more acute intervention. Patient verbalized understanding. After Visit Summary given.      Note: This document was prepared using Dragon voice recognition software and may include unintentional dictation errors.    Durward Parcel, FNP 08/05/20 1138

## 2020-08-05 NOTE — Discharge Instructions (Signed)
COVID testing ordered.  It will take between 2-7 days for test results.  Someone will contact you regarding abnormal results.    In the meantime: You should remain isolated in your home for 10 days from symptom onset AND greater than 24 hours after symptoms resolution (absence of fever without the use of fever-reducing medication and improvement in respiratory symptoms), whichever is longer Get plenty of rest and push fluids Tessalon Perles prescribed for cough Zyrtec for nasal congestion, runny nose, and/or sore throat Flonase for nasal congestion and runny nose Low-dose prednisone was prescribed Use medications daily for symptom relief Use OTC medications like ibuprofen or tylenol as needed fever or pain Call or go to the ED if you have any new or worsening symptoms such as fever, worsening cough, shortness of breath, chest tightness, chest pain, turning blue, changes in mental status, etc...  

## 2020-08-06 LAB — SARS-COV-2, NAA 2 DAY TAT

## 2020-08-06 LAB — NOVEL CORONAVIRUS, NAA: SARS-CoV-2, NAA: NOT DETECTED

## 2020-09-20 ENCOUNTER — Telehealth: Payer: Self-pay | Admitting: *Deleted

## 2020-09-20 ENCOUNTER — Telehealth: Payer: Self-pay | Admitting: Adult Health

## 2020-09-20 NOTE — Telephone Encounter (Signed)
Telephoned patient at home number. Patient states received 2nd depo shot. Started bleeding on August 28 and still has some bleeding. Some days are heavy while others just spotting. Patient experiencing no pain. Advised patient may have some abnormal bleeding while adjusting to depo. Patient to call back if starts experiencing heavy bleeding. All questions answered.

## 2020-09-20 NOTE — Telephone Encounter (Signed)
Patient called stating that she would like a call from either [place] or her Nurse regarding her bleeding while on her depo. Pt states that usually she starts bleeding a week before she is due for her next shot and a week after she gets the shot. Please contact pt

## 2020-10-10 ENCOUNTER — Other Ambulatory Visit: Payer: Self-pay | Admitting: Women's Health

## 2020-10-28 ENCOUNTER — Ambulatory Visit: Payer: Medicaid Other

## 2020-11-03 ENCOUNTER — Other Ambulatory Visit: Payer: Self-pay

## 2020-11-03 ENCOUNTER — Ambulatory Visit (INDEPENDENT_AMBULATORY_CARE_PROVIDER_SITE_OTHER): Payer: Medicaid Other | Admitting: *Deleted

## 2020-11-03 DIAGNOSIS — Z3042 Encounter for surveillance of injectable contraceptive: Secondary | ICD-10-CM | POA: Diagnosis not present

## 2020-11-03 MED ORDER — MEDROXYPROGESTERONE ACETATE 150 MG/ML IM SUSP
150.0000 mg | Freq: Once | INTRAMUSCULAR | Status: AC
  Administered 2020-11-03: 150 mg via INTRAMUSCULAR

## 2020-11-03 NOTE — Progress Notes (Signed)
   NURSE VISIT- INJECTION  SUBJECTIVE:  Stephanie Cook is a 34 y.o. [phone] female here for a Depo Provera for contraception/period management. She is a GYN patient.   OBJECTIVE:  There were no vitals taken for this visit.  Appears well, in no apparent distress  Injection administered in: Right upper quad. gluteus  Meds ordered this encounter  Medications  . medroxyPROGESTERone (DEPO-PROVERA) injection 150 mg    ASSESSMENT: GYN patient Depo Provera for contraception/period management PLAN: Follow-up: in 11-13 weeks for next Depo   Annamarie Dawley  11/03/2020 3:12 PM

## 2020-12-14 ENCOUNTER — Telehealth: Payer: Self-pay | Admitting: Adult Health

## 2020-12-14 NOTE — Telephone Encounter (Signed)
Pt states she was told to call back if she continued to bleed heavy Pt states she's been bleeding heavy for a week & a half  Please advise & notify pt   [place]

## 2020-12-14 NOTE — Telephone Encounter (Signed)
Pt sent a MyChart msg to schedule an appt since it's been over 6 months since she's been seen.

## 2020-12-16 ENCOUNTER — Ambulatory Visit (INDEPENDENT_AMBULATORY_CARE_PROVIDER_SITE_OTHER): Payer: Medicaid Other | Admitting: Adult Health

## 2020-12-16 ENCOUNTER — Other Ambulatory Visit (HOSPITAL_COMMUNITY)
Admission: RE | Admit: 2020-12-16 | Discharge: 2020-12-16 | Disposition: A | Discharge status: Home / Self Care | Payer: Medicaid Other | Source: Ambulatory Visit | Attending: Adult Health | Admitting: Adult Health

## 2020-12-16 ENCOUNTER — Encounter: Payer: Self-pay | Admitting: Adult Health

## 2020-12-16 ENCOUNTER — Other Ambulatory Visit: Payer: Self-pay

## 2020-12-16 VITALS — BP 132/95 | HR 82 | Ht 62.0 in | Wt 218.0 lb

## 2020-12-16 DIAGNOSIS — N926 Irregular menstruation, unspecified: Secondary | ICD-10-CM | POA: Diagnosis not present

## 2020-12-16 DIAGNOSIS — I10 Essential (primary) hypertension: Secondary | ICD-10-CM | POA: Diagnosis not present

## 2020-12-16 DIAGNOSIS — N760 Acute vaginitis: Secondary | ICD-10-CM | POA: Insufficient documentation

## 2020-12-16 DIAGNOSIS — B9689 Other specified bacterial agents as the cause of diseases classified elsewhere: Secondary | ICD-10-CM | POA: Insufficient documentation

## 2020-12-16 DIAGNOSIS — Z3202 Encounter for pregnancy test, result negative: Secondary | ICD-10-CM | POA: Diagnosis not present

## 2020-12-16 LAB — POCT URINE PREGNANCY: Preg Test, Ur: NEGATIVE

## 2020-12-16 MED ORDER — METRONIDAZOLE 500 MG PO TABS: 500.0000 mg | ORAL_TABLET | Freq: Two times a day (BID) | ORAL | 0 refills | Status: DC

## 2020-12-16 MED ORDER — MEGESTROL ACETATE 40 MG PO TABS: ORAL_TABLET | ORAL | 1 refills | Status: DC

## 2020-12-16 MED ORDER — LOSARTAN POTASSIUM-HCTZ 50-12.5 MG PO TABS: 1.0000 | ORAL_TABLET | Freq: Every day | ORAL | 6 refills | Status: DC

## 2020-12-16 MED ORDER — ACYCLOVIR 400 MG PO TABS: 400.0000 mg | ORAL_TABLET | Freq: Three times a day (TID) | ORAL | 3 refills | Status: DC

## 2020-12-16 NOTE — Progress Notes (Signed)
°  Subjective:     Patient ID: Stephanie Cook, female   DOB: February 20, 1986, 34 y.o.   MRN: [id number]  HPI Stephanie Cook is a 34 year old black female, married to female partner,[id number], in complaining of bleeding with depo for 2 days with some cramps, changing pads every 3-4 hours. She request refill on zovirax.  PCP is [facility]  Review of Systems +bleeding with depo +cramps Reviewed past medical,surgical, social and family history. Reviewed medications and allergies.     Objective:   Physical Exam BP (!) 132/95 (BP Location: Right Arm, Patient Position: Sitting, Cuff Size: Large)    Pulse 82    Ht 5\' 2"  (1.575 m)    Wt 218 lb (98.9 kg)    LMP 12/14/2020    Breastfeeding No Comment: depo   BMI 39.87 kg/m UPT is negative Skin warm and dry.Pelvic: external genitalia is normal in appearance no lesions, vagina: tan discharge with odor,urethra has no lesions or masses noted, cervix:smooth and bulbous, uterus: normal size, shape and contour, non tender, no masses felt, adnexa: no masses or tenderness noted. Bladder is non tender and no masses felt. Wet prep: + for clue cells and +WBCs. CV swab obtained.     Upstream - 12/16/20 1052      Pregnancy Intention Screening   Does the patient want to become pregnant in the next year? No    Does the patient's partner want to become pregnant in the next year? No    Would the patient like to discuss contraceptive options today? No      Contraception Wrap Up   Current Method Hormonal Injection    End Method Hormonal Injection    Contraception Counseling Provided No         Examination chaperoned by 12/18/20 LPN  Assessment:     1. Pregnancy test negative   2. Irregular bleeding CV swab obtained Will rx megace  Meds ordered this encounter  Medications   acyclovir (ZOVIRAX) 400 MG tablet    Sig: Take 1 tablet (400 mg total) by mouth 3 (three) times daily.    Dispense:  30 tablet    Refill:  3    Order Specific Question:   Supervising  Provider    Answer:   Malachy Mood [[id number]]   losartan-hydrochlorothiazide (HYZAAR) 50-12.5 MG tablet    Sig: Take 1 tablet by mouth daily.    Dispense:  30 tablet    Refill:  6    Order Specific Question:   Supervising Provider    Answer:   Lazaro Arms H [[id number]]   metroNIDAZOLE (FLAGYL) 500 MG tablet    Sig: Take 1 tablet (500 mg total) by mouth 2 (two) times daily.    Dispense:  14 tablet    Refill:  0    Order Specific Question:   Supervising Provider    Answer:   Duane Lope H [[id number]]   megestrol (MEGACE) 40 MG tablet    Sig: Take 2 daily til bleeding stops    Dispense:  60 tablet    Refill:  1    Order Specific Question:   Supervising Provider    Answer:   EURE, LUTHER H [[id number]]    3. BV (bacterial vaginosis) Rx flagyl 500 mg 1 bid x 7 days, no alcohol,or sex       4. Hypertension, unspecified type Refilled hyzaar Continue norvasc     Plan:     Follow up prn

## 2020-12-19 LAB — CERVICOVAGINAL ANCILLARY ONLY
Bacterial Vaginitis (gardnerella): POSITIVE — AB
Candida Glabrata: NEGATIVE
Candida Vaginitis: NEGATIVE
Chlamydia: NEGATIVE
Comment: NEGATIVE
Comment: NEGATIVE
Comment: NEGATIVE
Comment: NEGATIVE
Comment: NEGATIVE
Comment: NORMAL
Neisseria Gonorrhea: NEGATIVE
Trichomonas: NEGATIVE

## 2021-01-26 ENCOUNTER — Ambulatory Visit: Payer: Medicaid Other

## 2021-01-26 ENCOUNTER — Other Ambulatory Visit: Payer: Self-pay

## 2021-01-26 DIAGNOSIS — Z3042 Encounter for surveillance of injectable contraceptive: Secondary | ICD-10-CM

## 2021-01-26 MED ORDER — MEDROXYPROGESTERONE ACETATE 150 MG/ML IM SUSY: PREFILLED_SYRINGE | Freq: Once | INTRAMUSCULAR | Status: DC

## 2021-04-15 ENCOUNTER — Ambulatory Visit: Payer: Medicaid Other

## 2021-04-20 ENCOUNTER — Ambulatory Visit (INDEPENDENT_AMBULATORY_CARE_PROVIDER_SITE_OTHER): Payer: Medicaid Other | Admitting: *Deleted

## 2021-04-20 ENCOUNTER — Other Ambulatory Visit: Payer: Self-pay

## 2021-04-20 DIAGNOSIS — Z308 Encounter for other contraceptive management: Secondary | ICD-10-CM

## 2021-04-20 MED ORDER — MEDROXYPROGESTERONE ACETATE 150 MG/ML IM SUSP
150.0000 mg | Freq: Once | INTRAMUSCULAR | Status: AC
  Administered 2021-04-20: 150 mg via INTRAMUSCULAR

## 2021-04-20 NOTE — Progress Notes (Signed)
   NURSE VISIT- INJECTION  SUBJECTIVE:  Stephanie Cook is a 35 y.o. [phone] female here for a Depo Provera for contraception/period management. She is a GYN patient.   OBJECTIVE:  There were no vitals taken for this visit.  Appears well, in no apparent distress  Injection administered in: Left upper quad. gluteus  Meds ordered this encounter  Medications  . medroxyPROGESTERone (DEPO-PROVERA) injection 150 mg    ASSESSMENT: GYN patient Depo Provera for contraception/period management PLAN: Follow-up: in 11-13 weeks for next Depo   Malachy Mood  04/20/2021 11:15 AM

## 2021-07-05 ENCOUNTER — Other Ambulatory Visit: Payer: Self-pay | Admitting: Adult Health

## 2021-07-07 ENCOUNTER — Other Ambulatory Visit: Payer: Self-pay

## 2021-07-07 ENCOUNTER — Ambulatory Visit (INDEPENDENT_AMBULATORY_CARE_PROVIDER_SITE_OTHER): Payer: Medicaid Other

## 2021-07-07 DIAGNOSIS — Z3042 Encounter for surveillance of injectable contraceptive: Secondary | ICD-10-CM

## 2021-07-07 MED ORDER — MEDROXYPROGESTERONE ACETATE 150 MG/ML IM SUSP
150.0000 mg | Freq: Once | INTRAMUSCULAR | Status: AC
  Administered 2021-07-07: 150 mg via INTRAMUSCULAR

## 2021-07-07 NOTE — Progress Notes (Signed)
   NURSE VISIT- INJECTION  SUBJECTIVE:  Stephanie Cook is a 35 y.o. [phone] female here for a Depo Provera for contraception/period management. She is a GYN patient.   OBJECTIVE:  There were no vitals taken for this visit.  Appears well, in no apparent distress  Injection administered in: Right upper quad. gluteus  Meds ordered this encounter  Medications   medroxyPROGESTERone (DEPO-PROVERA) injection 150 mg    ASSESSMENT: GYN patient Depo Provera for contraception/period management PLAN: Follow-up: in 11-13 weeks for next Depo   Chaka Jefferys A Divine Hansley  07/07/2021 10:48 AM

## 2021-07-14 ENCOUNTER — Ambulatory Visit
Admission: EM | Admit: 2021-07-14 | Discharge: 2021-07-14 | Disposition: A | Discharge status: Home / Self Care | Payer: Medicaid Other | Attending: Emergency Medicine | Admitting: Emergency Medicine

## 2021-07-14 ENCOUNTER — Encounter: Payer: Self-pay | Admitting: Emergency Medicine

## 2021-07-14 DIAGNOSIS — L0231 Cutaneous abscess of buttock: Secondary | ICD-10-CM | POA: Diagnosis not present

## 2021-07-14 MED ORDER — DOXYCYCLINE HYCLATE 100 MG PO CAPS: 100.0000 mg | ORAL_CAPSULE | Freq: Two times a day (BID) | ORAL | 0 refills | Status: DC

## 2021-07-14 NOTE — ED Provider Notes (Signed)
[facility]   [id number] 07/14/21 Arrival Time: 1153   [id number]  SUBJECTIVE:  Stephanie Cook is a 35 y.o. female who presents with a possible abscess of her LT buttock x 3 days. Denies precipitating event or trauma.  Has tried warm compresses without relief.  Sore to the touch.  Reports similar symptoms in the past that improved with lancing.  Denies fever, chills, nausea, vomiting.  ROS: As per HPI.  All other pertinent ROS negative.     Past Medical History:  Diagnosis Date   Depression    Frequent urination 09/28/2015   HSV-2 (herpes simplex virus 2) infection    Hx of chlamydia infection    multiple times   Hx of gonorrhea    Hx of trichomoniasis    recurrent, 4 times   Hypertension    Missed period 09/28/2015   Pregnant    Preterm labor    STD (female)    chlamydia, trichomonas, gonorrhea, HPV, HSV   Past Surgical History:  Procedure Laterality Date   NO PAST SURGERIES     Allergies  Allergen Reactions   Motrin [Ibuprofen] Hives   Tramadol Hcl     headache   No current facility-administered medications on file prior to encounter.   Current Outpatient Medications on File Prior to Encounter  Medication Sig Dispense Refill   acyclovir (ZOVIRAX) 400 MG tablet Take 1 tablet (400 mg total) by mouth 3 (three) times daily. 30 tablet 3   amLODipine (NORVASC) 10 MG tablet Take 1 tablet (10 mg total) by mouth daily. 30 tablet 3   fluticasone (FLONASE) 50 MCG/ACT nasal spray Place 1 spray into both nostrils daily for 14 days. 16 g 0   medroxyPROGESTERone (DEPO-PROVERA) 150 MG/ML injection INJECT INTO THE MUSCLE ONCE EVERY 3 MONTHS. 1 mL 0   megestrol (MEGACE) 40 MG tablet Take 2 daily til bleeding stops 60 tablet 1   Social History   Socioeconomic History   Marital status: Married    Spouse name: lauren grady   Number of children: 4   Years of education: high school   Highest education level: 9th grade  Occupational History   Not on file   Tobacco Use   Smoking status: Former    Packs/day: 0.50    Years: 8.00    Pack years: 4.00    Types: Cigarettes   Smokeless tobacco: Never  Vaping Use   Vaping Use: Never used  Substance and Sexual Activity   Alcohol use: Not Currently    Comment: occasionally   Drug use: Not Currently   Sexual activity: Yes    Birth control/protection: Injection  Other Topics Concern   Not on file  Social History Narrative   Not on file   Social Determinants of Health   Financial Resource Strain: Not on file  Food Insecurity: Not on file  Transportation Needs: Not on file  Physical Activity: Not on file  Stress: Not on file  Social Connections: Not on file  Intimate Partner Violence: Not on file   Family History  Problem Relation Age of Onset   Lupus Mother    Cirrhosis Mother    Hypertension Maternal Grandmother     OBJECTIVE:  Vitals:   07/14/21 1232  BP: (!) 137/94  Pulse: 96  Resp: 16  Temp: 98.7 F (37.1 C)  TempSrc: Oral  SpO2: 96%    General appearance: alert; no distress Skin: 3-4 cm induration of her LT gluteal cleft; tender to touch; no active  drainage Psychological: alert and cooperative; normal mood and affect  ASSESSMENT & PLAN:  1. Abscess of left buttock     Meds ordered this encounter  Medications   doxycycline (VIBRAMYCIN) 100 MG capsule    Sig: Take 1 capsule (100 mg total) by mouth 2 (two) times daily.    Dispense:  20 capsule    Refill:  0    Order Specific Question:   Supervising Provider    Answer:   Eustace Moore [[id number]]   Would like to hold off on I and D today. Would like to try antibiotic first Apply warm compresses 3-4x daily for 10-15 minutes Wash site daily with warm water and mild soap Keep covered to avoid friction Take antibiotic as prescribed and to completion Follow up here or with PCP if symptoms persists Return or go to the ED if you have any new or worsening symptoms increased redness, swelling, pain, nausea,  vomiting, fever, chills, etc...   Reviewed expectations re: course of current medical issues. Questions answered. Outlined signs and symptoms indicating need for more acute intervention. Patient verbalized understanding. After Visit Summary given.           Zeynab, Klett, PA-C 07/14/21 1301

## 2021-07-14 NOTE — Discharge Instructions (Addendum)
Apply warm compresses 3-4x daily for 10-15 minutes Wash site daily with warm water and mild soap Keep covered to avoid friction Take antibiotic as prescribed and to completion Follow up here or with PCP if symptoms persists Return or go to the ED if you have any new or worsening symptoms increased redness, swelling, pain, nausea, vomiting, fever, chills, etc...  

## 2021-07-14 NOTE — ED Triage Notes (Signed)
Boil on left side of buttocks x 3 days.

## 2021-07-15 ENCOUNTER — Ambulatory Visit
Admission: EM | Admit: 2021-07-15 | Discharge: 2021-07-15 | Disposition: A | Discharge status: Home / Self Care | Payer: Medicaid Other | Attending: Emergency Medicine | Admitting: Emergency Medicine

## 2021-07-15 ENCOUNTER — Telehealth: Payer: Self-pay | Admitting: Emergency Medicine

## 2021-07-15 ENCOUNTER — Other Ambulatory Visit: Payer: Self-pay

## 2021-07-15 ENCOUNTER — Encounter: Payer: Self-pay | Admitting: Emergency Medicine

## 2021-07-15 DIAGNOSIS — L0231 Cutaneous abscess of buttock: Secondary | ICD-10-CM

## 2021-07-15 MED ORDER — PREDNISONE 20 MG PO TABS: 20.0000 mg | ORAL_TABLET | Freq: Two times a day (BID) | ORAL | 0 refills | Status: AC

## 2021-07-15 MED ORDER — MELOXICAM 15 MG PO TABS: 15.0000 mg | ORAL_TABLET | Freq: Every day | ORAL | 0 refills | Status: DC

## 2021-07-15 NOTE — Telephone Encounter (Signed)
Partner called back requesting pain medication.  Mobic sent to pharmacy on file for pain

## 2021-07-15 NOTE — Discharge Instructions (Addendum)
Keep dry and covered for next 24-48 hours Remove packing in 48 hours either at home or return here After packing is removed in you may then begin appling warm compresses 3-4x daily for 10-15 minutes.  You may then wash site daily with warm water and mild soap Keep covered to avoid friction Take antibiotic as prescribed and to completion Return sooner or go to the ED if you have any new or worsening symptoms such as increased redness, swelling, pain, nausea, vomiting, fever, chills, etc...  

## 2021-07-15 NOTE — ED Provider Notes (Signed)
[facility]   [id number] 07/15/21 Arrival Time: 1019   [id number]  SUBJECTIVE:  Stephanie Cook is a 35 y.o. female who presents with a returns for incision and drainage.  Report abscess to LT buttock x 4 days.  Denies injury.  Seen yesterday.  Wanted to try antibiotic first, reports no improvement.  Would like to have abscess drained.  Denies fever, chills, nausea,vomiting.    ROS: As per HPI.  All other pertinent ROS negative.     Past Medical History:  Diagnosis Date   Depression    Frequent urination 09/28/2015   HSV-2 (herpes simplex virus 2) infection    Hx of chlamydia infection    multiple times   Hx of gonorrhea    Hx of trichomoniasis    recurrent, 4 times   Hypertension    Missed period 09/28/2015   Pregnant    Preterm labor    STD (female)    chlamydia, trichomonas, gonorrhea, HPV, HSV   Past Surgical History:  Procedure Laterality Date   NO PAST SURGERIES     Allergies  Allergen Reactions   Motrin [Ibuprofen] Hives   Tramadol Hcl     headache   No current facility-administered medications on file prior to encounter.   Current Outpatient Medications on File Prior to Encounter  Medication Sig Dispense Refill   acyclovir (ZOVIRAX) 400 MG tablet Take 1 tablet (400 mg total) by mouth 3 (three) times daily. 30 tablet 3   amLODipine (NORVASC) 10 MG tablet Take 1 tablet (10 mg total) by mouth daily. 30 tablet 3   doxycycline (VIBRAMYCIN) 100 MG capsule Take 1 capsule (100 mg total) by mouth 2 (two) times daily. 20 capsule 0   fluticasone (FLONASE) 50 MCG/ACT nasal spray Place 1 spray into both nostrils daily for 14 days. 16 g 0   medroxyPROGESTERone (DEPO-PROVERA) 150 MG/ML injection INJECT INTO THE MUSCLE ONCE EVERY 3 MONTHS. 1 mL 0   megestrol (MEGACE) 40 MG tablet Take 2 daily til bleeding stops 60 tablet 1   Social History   Socioeconomic History   Marital status: Married    Spouse name: lauren grady   Number of children: 4   Years  of education: high school   Highest education level: 9th grade  Occupational History   Not on file  Tobacco Use   Smoking status: Former    Packs/day: 0.50    Years: 8.00    Pack years: 4.00    Types: Cigarettes   Smokeless tobacco: Never  Vaping Use   Vaping Use: Never used  Substance and Sexual Activity   Alcohol use: Not Currently    Comment: occasionally   Drug use: Not Currently   Sexual activity: Yes    Birth control/protection: Injection  Other Topics Concern   Not on file  Social History Narrative   Not on file   Social Determinants of Health   Financial Resource Strain: Not on file  Food Insecurity: Not on file  Transportation Needs: Not on file  Physical Activity: Not on file  Stress: Not on file  Social Connections: Not on file  Intimate Partner Violence: Not on file   Family History  Problem Relation Age of Onset   Lupus Mother    Cirrhosis Mother    Hypertension Maternal Grandmother     OBJECTIVE:  Vitals:   07/15/21 1026  BP: (!) 138/93  Pulse: 89  Resp: 16  Temp: 98.6 F (37 C)  TempSrc: Oral  SpO2: 97%  General appearance: alert; no distress Skin: 3-4 cm induration of her RT gluteal cleft; tender to touch; no active drainage Psychological: alert and cooperative; normal mood and affect  Procedure: Verbal consent obtained. Area over induration cleaned with betadine. Lidocaine 2% with epinephrine used to obtain local anesthesia. The most fluctuant portion of the abscess was incised with a #11 blade scalpel. Abscess cavity explored and evacuated. Loculations broken up with a curved hemostat as best as possible given patient discomfort. Cavity packed with packing material and dressed with a clean gauze dressing. Minimal bleeding. No complications.  ASSESSMENT & PLAN:  1. Abscess of left buttock     No orders of the defined types were placed in this encounter.  Keep dry and covered for next 24-48 hours Remove packing in 48 hours either  at home or return here After packing is removed in you may then begin appling warm compresses 3-4x daily for 10-15 minutes.  You may then wash site daily with warm water and mild soap Keep covered to avoid friction Take antibiotic as prescribed and to completion Return sooner or go to the ED if you have any new or worsening symptoms such as increased redness, swelling, pain, nausea, vomiting, fever, chills, etc...   Reviewed expectations re: course of current medical issues. Questions answered. Outlined signs and symptoms indicating need for more acute intervention. Patient verbalized understanding. After Visit Summary given.        Alla, Sloma, PA-C 07/15/21 1118

## 2021-07-15 NOTE — ED Triage Notes (Signed)
Seen yesterday for boil.  States area has become worse.

## 2021-09-27 ENCOUNTER — Other Ambulatory Visit: Payer: Self-pay | Admitting: Adult Health

## 2021-09-27 ENCOUNTER — Other Ambulatory Visit: Payer: Self-pay | Admitting: Women's Health

## 2021-09-27 ENCOUNTER — Ambulatory Visit: Payer: Medicaid Other

## 2021-09-28 ENCOUNTER — Ambulatory Visit: Payer: Medicaid Other

## 2021-10-03 ENCOUNTER — Other Ambulatory Visit: Payer: Self-pay

## 2021-10-03 ENCOUNTER — Ambulatory Visit (INDEPENDENT_AMBULATORY_CARE_PROVIDER_SITE_OTHER): Payer: Medicaid Other

## 2021-10-03 DIAGNOSIS — Z3042 Encounter for surveillance of injectable contraceptive: Secondary | ICD-10-CM | POA: Diagnosis not present

## 2021-10-03 MED ORDER — MEDROXYPROGESTERONE ACETATE 150 MG/ML IM SUSP
150.0000 mg | Freq: Once | INTRAMUSCULAR | Status: AC
  Administered 2021-10-03: 150 mg via INTRAMUSCULAR

## 2021-10-03 NOTE — Progress Notes (Signed)
   NURSE VISIT- INJECTION  SUBJECTIVE:  Stephanie Cook is a 35 y.o. [phone] female here for a Depo Provera for contraception/period management. She is a GYN patient.   OBJECTIVE:  There were no vitals taken for this visit.  Appears well, in no apparent distress  Injection administered in: Left upper quad. gluteus  Meds ordered this encounter  Medications   medroxyPROGESTERone (DEPO-PROVERA) injection 150 mg    ASSESSMENT: GYN patient Depo Provera for contraception/period management PLAN: Follow-up: in 11-13 weeks for next Depo   Ellyana Crigler A Kendry Pfarr  10/03/2021 4:28 PM

## 2021-12-26 ENCOUNTER — Other Ambulatory Visit: Payer: Self-pay

## 2021-12-26 ENCOUNTER — Ambulatory Visit (INDEPENDENT_AMBULATORY_CARE_PROVIDER_SITE_OTHER): Payer: Medicaid Other

## 2021-12-26 DIAGNOSIS — Z3042 Encounter for surveillance of injectable contraceptive: Secondary | ICD-10-CM | POA: Diagnosis not present

## 2021-12-26 MED ORDER — MEDROXYPROGESTERONE ACETATE 150 MG/ML IM SUSP
150.0000 mg | Freq: Once | INTRAMUSCULAR | Status: AC
  Administered 2021-12-26: 150 mg via INTRAMUSCULAR

## 2021-12-26 NOTE — Progress Notes (Signed)
° °  NURSE VISIT- INJECTION  SUBJECTIVE:  Stephanie Cook is a 36 y.o. [phone] female here for a Depo Provera for contraception/period management. She is a GYN patient.   OBJECTIVE:  There were no vitals taken for this visit.  Appears well, in no apparent distress  Injection administered in: Right upper quad. gluteus  Meds ordered this encounter  Medications   medroxyPROGESTERone (DEPO-PROVERA) injection 150 mg    ASSESSMENT: GYN patient Depo Provera for contraception/period management PLAN: Follow-up: in 11-13 weeks for next Depo   Audrey Thull A Nalini Alcaraz  12/26/2021 4:27 PM

## 2022-01-31 ENCOUNTER — Ambulatory Visit
Admission: EM | Admit: 2022-01-31 | Discharge: 2022-01-31 | Disposition: A | Discharge status: Home / Self Care | Payer: Medicaid Other | Attending: Student | Admitting: Student

## 2022-01-31 ENCOUNTER — Other Ambulatory Visit: Payer: Self-pay

## 2022-01-31 DIAGNOSIS — Z3042 Encounter for surveillance of injectable contraceptive: Secondary | ICD-10-CM | POA: Diagnosis present

## 2022-01-31 DIAGNOSIS — Z112 Encounter for screening for other bacterial diseases: Secondary | ICD-10-CM | POA: Insufficient documentation

## 2022-01-31 DIAGNOSIS — J029 Acute pharyngitis, unspecified: Secondary | ICD-10-CM | POA: Insufficient documentation

## 2022-01-31 DIAGNOSIS — Z886 Allergy status to analgesic agent status: Secondary | ICD-10-CM | POA: Insufficient documentation

## 2022-01-31 LAB — POCT RAPID STREP A (OFFICE): Rapid Strep A Screen: NEGATIVE

## 2022-01-31 MED ORDER — LIDOCAINE VISCOUS HCL 2 % MT SOLN: 15.0000 mL | OROMUCOSAL | 0 refills | Status: DC | PRN

## 2022-01-31 NOTE — ED Triage Notes (Signed)
Pt reports sore throat, chills and body ache sx 2 days. Pt has not taken any OTC for complaints.

## 2022-01-31 NOTE — ED Provider Notes (Signed)
[facility]    CSN: [id number] Arrival date & time: 01/31/22  1149      History   Chief Complaint Chief Complaint  Patient presents with   Sore Throat   Generalized Body Aches        Chills    HPI Stephanie Cook is a 36 y.o. female presenting with viral syndrome for 3 days following exposure to daughter who is also sick.  Medical history Depo contraception.  Describes sore throat, pain with swallowing, myalgias, subjective chills.  Has not monitored temperature at home.  Has not taken any medications for the symptoms.  Denies sensation of throat closing, trouble swallowing, voice changes.  HPI  Past Medical History:  Diagnosis Date   Depression    Frequent urination 09/28/2015   HSV-2 (herpes simplex virus 2) infection    Hx of chlamydia infection    multiple times   Hx of gonorrhea    Hx of trichomoniasis    recurrent, 4 times   Hypertension    Missed period 09/28/2015   Pregnant    Preterm labor    STD (female)    chlamydia, trichomonas, gonorrhea, HPV, HSV    Patient Active Problem List   Diagnosis Date Noted   BV (bacterial vaginosis) 12/16/2020   Irregular bleeding 12/16/2020   Pregnancy test negative 12/16/2020   Screening cholesterol level 05/11/2020   Urine pregnancy test negative 05/11/2020   Hypertension 05/11/2020   Menorrhagia with regular cycle 05/11/2020   Dysmenorrhea 05/11/2020   Encounter for initial prescription of injectable contraceptive 05/11/2020   Preterm labor 01/24/2019   Supervision of high risk pregnancy, antepartum 08/08/2018   Essential hypertension, benign 08/01/2016   Obesity 08/01/2016   Cigarette nicotine dependence without complication [id number]   Major depressive disorder, single episode, severe, specified as with psychotic behavior 03/12/2014   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 Authorizing Provider  lidocaine (XYLOCAINE) 2 % solution Use as directed 15 mLs in the mouth or throat every 4 (four) hours as needed for mouth pain. 01/31/22  Yes Hazel Sams, PA-C  amLODipine (NORVASC) 10 MG tablet Take 1 tablet (10 mg total) by mouth daily. 01/26/19   Chancy Milroy, MD  fluticasone (FLONASE) 50 MCG/ACT nasal spray Place 1 spray into both nostrils daily for 14 days. 08/05/20 08/19/20  Avegno, Darrelyn Hillock, FNP  medroxyPROGESTERone (DEPO-PROVERA) 150 MG/ML injection INJECT 1ML INTO THE MUSCLE ONCE EVERY 3 MONTHS. 09/27/21   Estill Dooms, NP    Family History Family History  Problem Relation Age of Onset   Lupus Mother    Cirrhosis Mother    Hypertension Maternal Grandmother  Social History Social History   Tobacco Use   Smoking status: Former    Packs/day: 0.50    Years: 8.00    Pack years: 4.00    Types: Cigarettes   Smokeless tobacco: Never  Vaping Use   Vaping Use: Never used  Substance Use Topics   Alcohol use: Not Currently    Comment: occasionally   Drug use: Not Currently     Allergies   Coconut fatty acids, Ibuprofen, and Tramadol hcl   Review of Systems Review of Systems  Constitutional:  Positive for chills. Negative for appetite change and fever.  HENT:  Positive for congestion and sore throat. Negative for ear pain, rhinorrhea, sinus pressure and sinus pain.   Eyes:  Negative for redness and visual disturbance.  Respiratory:  Positive  for cough. Negative for chest tightness, shortness of breath and wheezing.   Cardiovascular:  Negative for chest pain and palpitations.  Gastrointestinal:  Negative for abdominal pain, constipation, diarrhea, nausea and vomiting.  Genitourinary:  Negative for dysuria, frequency and urgency.  Musculoskeletal:  Positive for myalgias.  Neurological:  Negative for dizziness, weakness and headaches.  Psychiatric/Behavioral:  Negative for confusion.   All other systems reviewed and are negative.   Physical Exam Triage Vital Signs ED Triage Vitals  Enc Vitals Group     BP 01/31/22 1242 (!) 147/94     Pulse Rate 01/31/22 1242 80     Resp 01/31/22 1242 18     Temp 01/31/22 1242 99.3 F (37.4 C)     Temp Source 01/31/22 1242 Oral     SpO2 01/31/22 1242 96 %     Weight --      Height --      Head Circumference --      Peak Flow --      Pain Score 01/31/22 1243 10     Pain Loc --      Pain Edu? --      Excl. in [place]? --    No data found.  Updated Vital Signs BP (!) 147/94 (BP Location: Right Arm)    Pulse 80    Temp 99.3 F (37.4 C) (Oral)    Resp 18    SpO2 96%   Visual Acuity Right Eye Distance:   Left Eye Distance:   Bilateral Distance:    Right Eye Near:   Left Eye Near:    Bilateral Near:     Physical Exam Vitals reviewed.  Constitutional:      General: She is not in acute distress.    Appearance: Normal appearance. She is not ill-appearing.  HENT:     Head: Normocephalic and atraumatic.     Right Ear: Tympanic membrane, ear canal and external ear normal. No tenderness. No middle ear effusion. There is no impacted cerumen. Tympanic membrane is not perforated, erythematous, retracted or bulging.     Left Ear: Tympanic membrane, ear canal and external ear normal. No tenderness.  No middle ear effusion. There is no impacted cerumen. Tympanic membrane is not perforated, erythematous, retracted or bulging.     Nose: Nose normal. No congestion.     Mouth/Throat:     Mouth:  Mucous membranes are moist.     Pharynx: Uvula midline. Posterior oropharyngeal erythema present. No oropharyngeal exudate.     Comments: Smooth erythema posterior pharynx.  There is no tonsillar enlargement or exudate.  Uvula midline. Eyes:     Extraocular Movements: Extraocular movements intact.     Pupils: Pupils are  equal, round, and reactive to light.  Cardiovascular:     Rate and Rhythm: Normal rate and regular rhythm.     Heart sounds: Normal heart sounds.  Pulmonary:     Effort: Pulmonary effort is normal.     Breath sounds: Normal breath sounds. No decreased breath sounds, wheezing, rhonchi or rales.  Abdominal:     Palpations: Abdomen is soft.     Tenderness: There is no abdominal tenderness. There is no guarding or rebound.  Lymphadenopathy:     Cervical: No cervical adenopathy.     Right cervical: No superficial, deep or posterior cervical adenopathy.    Left cervical: No superficial, deep or posterior cervical adenopathy.  Neurological:     General: No focal deficit present.     Mental Status: She is alert and oriented to person, place, and time.  Psychiatric:        Mood and Affect: Mood normal.        Behavior: Behavior normal.        Thought Content: Thought content normal.        Judgment: Judgment normal.     UC Treatments / Results  Labs (all labs ordered are listed, but only abnormal results are displayed) Labs Reviewed  CULTURE, GROUP A STREP [facility])  POCT RAPID STREP A (OFFICE)    EKG   Radiology No results found.  Procedures Procedures (including critical care time)  Medications Ordered in UC Medications - No data to display  Initial Impression / Assessment and Plan / UC Course  I have reviewed the triage vital signs and the nursing notes.  Pertinent labs & imaging results that were available during my care of the patient were reviewed by me and considered in my medical decision making (see chart for details).     This patient is a very  pleasant 36 y.o. year old female presenting with viral pharyngitis. Afebrile, nontachy. Depo contraception, last injection was 4 weeks ago per pt.  Today on exam there is no asymmetry, low suspicion for deep space infection.  No evidence of bacteremia, sepsis.  Rapid strep negative, culture sent.  Declines covid PCR.   Viscous lidocaine sent. Tylenol for additional relief. Avoid ibuprofen given allergy to this.    ED return precautions discussed. Patient verbalizes understanding and agreement.    Final Clinical Impressions(s) / UC Diagnoses   Final diagnoses:  Viral pharyngitis  Screening for streptococcal infection  Depo-Provera contraceptive status  Allergy to NSAIDs     Discharge Instructions      -For sore throat, use lidocaine mouthwash up to every 4 hours. Make sure not to eat for at least 1 hour after using this, as your mouth will be very numb and you could bite yourself. -You can take Tylenol up to 1000 mg 3 times daily -With a virus, you're typically contagious for 5-7 days, or as long as you're having fevers.  -Follow-up if symptoms worsen/persist     ED Prescriptions     Medication Sig Dispense Auth. Provider   lidocaine (XYLOCAINE) 2 % solution Use as directed 15 mLs in the mouth or throat every 4 (four) hours as needed for mouth pain. 100 mL Hazel Sams, PA-C      PDMP not reviewed this encounter.   Hazel Sams, PA-C 01/31/22 1308

## 2022-01-31 NOTE — Discharge Instructions (Addendum)
-  For sore throat, use lidocaine mouthwash up to every 4 hours. Make sure not to eat for at least 1 hour after using this, as your mouth will be very numb and you could bite yourself. -You can take Tylenol up to 1000 mg 3 times daily -With a virus, you're typically contagious for 5-7 days, or as long as you're having fevers.  -Follow-up if symptoms worsen/persist

## 2022-02-03 LAB — CULTURE, GROUP A STREP (THRC)

## 2022-03-16 ENCOUNTER — Ambulatory Visit: Payer: Medicaid Other

## 2022-03-17 ENCOUNTER — Ambulatory Visit (INDEPENDENT_AMBULATORY_CARE_PROVIDER_SITE_OTHER): Payer: Medicaid Other | Admitting: *Deleted

## 2022-03-17 DIAGNOSIS — Z3042 Encounter for surveillance of injectable contraceptive: Secondary | ICD-10-CM

## 2022-03-17 MED ORDER — MEDROXYPROGESTERONE ACETATE 150 MG/ML IM SUSP
150.0000 mg | Freq: Once | INTRAMUSCULAR | Status: AC
  Administered 2022-03-17: 150 mg via INTRAMUSCULAR

## 2022-03-17 NOTE — Progress Notes (Signed)
? ?  NURSE VISIT- INJECTION ? ?SUBJECTIVE:  ?Stephanie Cook is a 36 y.o. [phone] female here for a Depo Provera for contraception/period management. She is a GYN patient.  ? ?OBJECTIVE:  ?There were no vitals taken for this visit.  ?Appears well, in no apparent distress ? ?Injection administered in: Left upper quad. gluteus ? ?Meds ordered this encounter  ?Medications  ? medroxyPROGESTERone (DEPO-PROVERA) injection 150 mg  ? ? ?ASSESSMENT: ?GYN patient Depo Provera for contraception/period management ?PLAN: ?Follow-up: in 11-13 weeks for next Depo  ? ?Annamarie Dawley  ?03/17/2022 ?11:49 AM  ?

## 2022-03-24 ENCOUNTER — Ambulatory Visit
Admission: EM | Admit: 2022-03-24 | Discharge: 2022-03-24 | Disposition: A | Discharge status: Home / Self Care | Payer: Medicaid Other | Attending: Family Medicine | Admitting: Family Medicine

## 2022-03-24 DIAGNOSIS — Z20822 Contact with and (suspected) exposure to covid-19: Secondary | ICD-10-CM

## 2022-03-24 NOTE — ED Provider Notes (Signed)
?[facility] ? ? ? ?CSN: [id number] ?Arrival date & time: 03/24/22  1716 ? ? ?  ? ?History   ?Chief Complaint ?Chief Complaint  ?Patient presents with  ? exposure to Covid  ? ? ?HPI ?Stephanie Cook is a 36 y.o. female.  ? ?Patient presenting today with concern for exposure to COVID several days ago, and now significant other displaying symptoms.  She is currently asymptomatic wants to get tested. ? ? ?Past Medical History:  ?Diagnosis Date  ? Depression   ? Frequent urination 09/28/2015  ? HSV-2 (herpes simplex virus 2) infection   ? Hx of chlamydia infection   ? multiple times  ? Hx of gonorrhea   ? Hx of trichomoniasis   ? recurrent, 4 times  ? Hypertension   ? Missed period 09/28/2015  ? Pregnant   ? Preterm labor   ? STD (female)   ? chlamydia, trichomonas, gonorrhea, HPV, HSV  ? ? ?Patient Active Problem List  ? Diagnosis Date Noted  ? BV (bacterial vaginosis) 12/16/2020  ? Irregular bleeding 12/16/2020  ? Pregnancy test negative 12/16/2020  ? Screening cholesterol level 05/11/2020  ? Urine pregnancy test negative 05/11/2020  ? Hypertension 05/11/2020  ? Menorrhagia with regular cycle 05/11/2020  ? Dysmenorrhea 05/11/2020  ? Encounter for initial prescription of injectable contraceptive 05/11/2020  ? Preterm labor 01/24/2019  ? Supervision of high risk pregnancy, antepartum 08/08/2018  ? Essential hypertension, benign 08/01/2016  ? Obesity 08/01/2016  ? Cigarette nicotine dependence without complication 08/01/2016  ? Major depressive disorder, single episode, severe, specified as with psychotic behavior 03/12/2014  ? 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 Mood disorder, drug-induced ([facility]) 03/11/2014  ? Depression 11/25/2013  ? Benign essential hypertension, antepartum 06/11/2013  ? H/O preterm delivery, currently pregnant 03/18/2013  ? 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 ? ?Past Surgical History:  ?Procedure Laterality Date  ? NO PAST SURGERIES    ? ? ?OB  History   ? ? Gravida  ?6  ? Para  ?5  ? Term  ?2  ? Preterm  ?3  ? AB  ?1  ? Living  ?5  ?  ? ? SAB  ?1  ? IAB  ?0  ? Ectopic  ?0  ? Multiple  ?0  ? Live Births  ?5  ?   ?  ?  ? ? ? ?Home Medications   ? ?Prior to Admission medications   ?Medication Sig Start Date End Date Taking? Authorizing Provider  ?amLODipine (NORVASC) 10 MG tablet Take 1 tablet (10 mg total) by mouth daily. 01/26/19   Hermina Staggers, MD  ?fluticasone (FLONASE) 50 MCG/ACT nasal spray Place 1 spray into both nostrils daily for 14 days. 08/05/20 08/19/20  Durward Parcel, FNP  ?lidocaine (XYLOCAINE) 2 % solution Use as directed 15 mLs in the mouth or throat every 4 (four) hours as needed for mouth pain. 01/31/22   Rhys Martini, PA-C  ?medroxyPROGESTERone (DEPO-PROVERA) 150 MG/ML injection INJECT INTO THE MUSCLE ONCE EVERY 3 MONTHS. 09/27/21   Adline Potter, NP  ? ? ?Family History ?Family History  ?Problem Relation Age of Onset  ? Lupus Mother   ? Cirrhosis Mother   ? Hypertension Maternal Grandmother   ? ? ?Social History ?Social History  ? ?Tobacco Use  ? Smoking status: Every Day  ?  Packs/day: 0.50  ?  Years: 8.00  ?  Pack years: 4.00  ?  Types: Cigarettes  ?  Smokeless tobacco: Never  ?Vaping Use  ? Vaping Use: Never used  ?Substance Use Topics  ? Alcohol use: Yes  ?  Comment: occasionally  ? Drug use: Not Currently  ? ? ? ?Allergies   ?Coconut fatty acids, Ibuprofen, and Tramadol hcl ? ? ?Review of Systems ?Review of Systems ?Per HPI ? ?Physical Exam ?Triage Vital Signs ?ED Triage Vitals [03/24/22 1753]  ?Enc Vitals Group  ?   BP (!) 140/94  ?   Pulse Rate 74  ?   Resp 18  ?   Temp 98.6 ?F (37 ?C)  ?   Temp Source Oral  ?   SpO2 97 %  ?   Weight   ?   Height   ?   Head Circumference   ?   Peak Flow   ?   Pain Score 0  ?   Pain Loc   ?   Pain Edu?   ?   Excl. in [place]?   ? ?No data found. ? ?Updated Vital Signs ?BP (!) 140/94 (BP Location: Right Arm)   Pulse 74   Temp 98.6 ?F (37 ?C) (Oral)   Resp 18   SpO2 97%  ? ?Visual  Acuity ?Right Eye Distance:   ?Left Eye Distance:   ?Bilateral Distance:   ? ?Right Eye Near:   ?Left Eye Near:    ?Bilateral Near:    ? ?Physical Exam ?Vitals and nursing note reviewed.  ?Constitutional:   ?   Appearance: Normal appearance. She is not ill-appearing.  ?HENT:  ?   Head: Atraumatic.  ?Eyes:  ?   Extraocular Movements: Extraocular movements intact.  ?   Conjunctiva/sclera: Conjunctivae normal.  ?Cardiovascular:  ?   Rate and Rhythm: Normal rate and regular rhythm.  ?   Heart sounds: Normal heart sounds.  ?Pulmonary:  ?   Effort: Pulmonary effort is normal.  ?   Breath sounds: Normal breath sounds.  ?Musculoskeletal:     ?   General: Normal range of motion.  ?   Cervical back: Normal range of motion and neck supple.  ?Skin: ?   General: Skin is warm and dry.  ?Neurological:  ?   Mental Status: She is alert and oriented to person, place, and time.  ?Psychiatric:     ?   Mood and Affect: Mood normal.     ?   Thought Content: Thought content normal.     ?   Judgment: Judgment normal.  ? ? ? ?UC Treatments / Results  ?Labs ?(all labs ordered are listed, but only abnormal results are displayed) ?Labs Reviewed  ?COVID-19, FLU A+B NAA  ? ? ?EKG ? ? ?Radiology ?No results found. ? ?Procedures ?Procedures (including critical care time) ? ?Medications Ordered in UC ?Medications - No data to display ? ?Initial Impression / Assessment and Plan / UC Course  ?I have reviewed the triage vital signs and the nursing notes. ? ?Pertinent labs & imaging results that were available during my care of the patient were reviewed by me and considered in my medical decision making (see chart for details). ? ?  ? ?COVID and flu test pending, discussed supportive over-the-counter medications and home care if becoming symptomatic. ? ?Final Clinical Impressions(s) / UC Diagnoses  ? ?Final diagnoses:  ?Exposure to COVID-19 virus  ? ?Discharge Instructions   ?None ?  ? ?ED Prescriptions   ?None ?  ? ?PDMP not reviewed this encounter. ?   ?Particia Nearing, PA-C ?03/24/22 1818 ? ?

## 2022-03-24 NOTE — ED Triage Notes (Signed)
Pt states she was exposed to Covid 2 days ago and she would like to be tested ? ?Pt states she has had no symptoms ?

## 2022-03-26 LAB — COVID-19, FLU A+B NAA
Influenza A, NAA: NOT DETECTED
Influenza B, NAA: NOT DETECTED
SARS-CoV-2, NAA: NOT DETECTED

## 2022-04-05 ENCOUNTER — Encounter: Payer: Self-pay | Admitting: Emergency Medicine

## 2022-04-05 ENCOUNTER — Other Ambulatory Visit: Payer: Self-pay

## 2022-04-05 ENCOUNTER — Ambulatory Visit
Admission: EM | Admit: 2022-04-05 | Discharge: 2022-04-05 | Disposition: A | Discharge status: Home / Self Care | Payer: Medicaid Other | Attending: Family Medicine | Admitting: Family Medicine

## 2022-04-05 DIAGNOSIS — R1031 Right lower quadrant pain: Secondary | ICD-10-CM

## 2022-04-05 DIAGNOSIS — Z1152 Encounter for screening for COVID-19: Secondary | ICD-10-CM

## 2022-04-05 DIAGNOSIS — J069 Acute upper respiratory infection, unspecified: Secondary | ICD-10-CM | POA: Diagnosis not present

## 2022-04-05 DIAGNOSIS — R1032 Left lower quadrant pain: Secondary | ICD-10-CM

## 2022-04-05 LAB — POCT URINALYSIS DIP (MANUAL ENTRY)
Bilirubin, UA: NEGATIVE
Blood, UA: NEGATIVE
Glucose, UA: NEGATIVE mg/dL
Ketones, POC UA: NEGATIVE mg/dL
Leukocytes, UA: NEGATIVE
Nitrite, UA: NEGATIVE
Protein Ur, POC: 30 mg/dL — AB
Spec Grav, UA: >=1.03 — AB (ref 1.010–1.025)
Urobilinogen, UA: 0.2 E.U./dL
pH, UA: 6 (ref 5.0–8.0)

## 2022-04-05 LAB — POCT URINE PREGNANCY: Preg Test, Ur: NEGATIVE

## 2022-04-05 NOTE — ED Triage Notes (Signed)
Pt reports nasal congestion, sneezing, headache, lower abdominal pain and diarrhea since this am. Pt reports spouse has URI.  ? ? ?

## 2022-04-05 NOTE — ED Provider Notes (Signed)
?[facility] ? ? ?[id number] ?04/05/22 Arrival Time: 1210 ? ?ASSESSMENT & PLAN: ? ?1. Viral URI   ?2. Encounter for screening for COVID-19   ?3. Bilateral lower abdominal discomfort   ? ?Labs Reviewed  ?POCT URINALYSIS DIP (MANUAL ENTRY) - Abnormal; Notable for the following components:  ?    Result Value  ? Clarity, UA hazy (*)   ? Spec Grav, UA >=1.030 (*)   ? Protein Ur, POC =30 (*)   ? All other components within normal limits  ?COVID-19, FLU A+B NAA  ?POCT URINE PREGNANCY  ?UPT negative. ? ?Viral testing sent. ?Discussed typical duration of suspected viral illnesses. ? ? ?Discharge Instructions   ? ?  ?You may try taking over the counter Claritin-D to help with your nasal congestion. ? ? ? ?Work note provided. ? ? Follow-up Information   ? ? [facility].   ?Specialty: Urgent Care ?Why: If worsening or failing to improve as anticipated. ?Contact information: ?[address] ?[place] [address] ?[phone] ? ?  ?  ? ?  ?  ? ?  ? ? ?Reviewed expectations re: course of current medical issues. Questions answered. ?Outlined signs and symptoms indicating need for more acute intervention. ?Understanding verbalized. ?After Visit Summary given. ? ? ?SUBJECTIVE: ?History from: Patient. ?Stephanie Cook is a 36 y.o. female. Reports: nasal congestion, sneezing, HA, body aches, mild loose non-bloody stools; abrupt onset today. Mild lower abd discomfort. Spouse with URI symptoms for sev days. Denies: cough and difficulty breathing. Normal PO intake. No LMP recorded. Patient has had an injection. ? ? ?OBJECTIVE: ? ?Vitals:  ? 04/05/22 1218 04/05/22 1223  ?BP: (!) 141/99   ?Pulse: 96   ?Resp: 18   ?Temp: 98.2 ?F (36.8 ?C)   ?TempSrc: Oral   ?SpO2: 98%   ?Weight:  95.3 kg  ?Height:  5\' 3"  (1.6 m)  ?  ?General appearance: alert; no distress ?Eyes: PERRLA; EOMI; conjunctiva normal ?HENT: [place]; AT; with nasal congestion ?Neck: supple  ?Lungs: speaks full  sentences without difficulty; unlabored ?Abd: soft; benign ?Extremities: no edema ?Skin: warm and dry ?Neurologic: normal gait ?Psychological: alert and cooperative; normal mood and affect ? ?Labs: ?Results for orders placed or performed during the hospital encounter of 04/05/22  ?POCT urinalysis dipstick  ?Result Value Ref Range  ? Color, UA yellow yellow  ? Clarity, UA hazy (A) clear  ? Glucose, UA negative negative mg/dL  ? Bilirubin, UA negative negative  ? Ketones, POC UA negative negative mg/dL  ? Spec Grav, UA >=1.030 (A) 1.010 - 1.025  ? Blood, UA negative negative  ? pH, UA 6.0 5.0 - 8.0  ? Protein Ur, POC =30 (A) negative mg/dL  ? Urobilinogen, UA 0.2 0.2 or 1.0 E.U./dL  ? Nitrite, UA Negative Negative  ? Leukocytes, UA Negative Negative  ?POCT urine pregnancy  ?Result Value Ref Range  ? Preg Test, Ur Negative Negative  ? ?Labs Reviewed  ?POCT URINALYSIS DIP (MANUAL ENTRY) - Abnormal; Notable for the following components:  ?    Result Value  ? Clarity, UA hazy (*)   ? Spec Grav, UA >=1.030 (*)   ? Protein Ur, POC =30 (*)   ? All other components within normal limits  ?COVID-19, FLU A+B NAA  ?POCT URINE PREGNANCY  ? ? ?Allergies  ?Allergen Reactions  ? Coconut Fatty Acids   ? Ibuprofen Hives  ?  Other reaction(s): Hives, Hives  ? Tramadol Hcl   ?  headache  ? ? ?Past Medical History:  ?Diagnosis Date  ? Depression   ? Frequent urination 09/28/2015  ? HSV-2 (herpes simplex virus 2) infection   ? Hx of chlamydia infection   ? multiple times  ? Hx of gonorrhea   ? Hx of trichomoniasis   ? recurrent, 4 times  ? Hypertension   ? Missed period 09/28/2015  ? Pregnant   ? Preterm labor   ? STD (female)   ? chlamydia, trichomonas, gonorrhea, HPV, HSV  ? ?Social History  ? ?Socioeconomic History  ? Marital status: Married  ?  Spouse name: lauren grady  ? Number of children: 4  ? Years of education: high school  ? Highest education level: 9th grade  ?Occupational History  ? Not on file  ?Tobacco Use  ? Smoking status:  Every Day  ?  Packs/day: 0.50  ?  Years: 8.00  ?  Pack years: 4.00  ?  Types: Cigarettes  ? Smokeless tobacco: Never  ?Vaping Use  ? Vaping Use: Never used  ?Substance and Sexual Activity  ? Alcohol use: Yes  ?  Comment: occasionally  ? Drug use: Not Currently  ? Sexual activity: Yes  ?  Birth control/protection: Injection  ?Other Topics Concern  ? Not on file  ?Social History Narrative  ? Not on file  ? ?Social Determinants of Health  ? ?Financial Resource Strain: Not on file  ?Food Insecurity: Not on file  ?Transportation Needs: Not on file  ?Physical Activity: Not on file  ?Stress: Not on file  ?Social Connections: Not on file  ?Intimate Partner Violence: Not on file  ? ?Family History  ?Problem Relation Age of Onset  ? Lupus Mother   ? Cirrhosis Mother   ? Hypertension Maternal Grandmother   ? ?Past Surgical History:  ?Procedure Laterality Date  ? NO PAST SURGERIES    ? ?  ?Mardella Layman, MD ?04/05/22 1339 ? ?

## 2022-04-05 NOTE — Discharge Instructions (Addendum)
You may try taking over the counter Claritin-D to help with your nasal congestion. ?

## 2022-04-07 LAB — COVID-19, FLU A+B NAA
Influenza A, NAA: NOT DETECTED
Influenza B, NAA: NOT DETECTED
SARS-CoV-2, NAA: NOT DETECTED

## 2022-05-11 ENCOUNTER — Ambulatory Visit (INDEPENDENT_AMBULATORY_CARE_PROVIDER_SITE_OTHER): Payer: Medicaid Other | Admitting: *Deleted

## 2022-05-11 ENCOUNTER — Other Ambulatory Visit (HOSPITAL_COMMUNITY)
Admission: RE | Admit: 2022-05-11 | Discharge: 2022-05-11 | Disposition: A | Discharge status: Home / Self Care | Payer: Medicaid Other | Source: Ambulatory Visit | Attending: Obstetrics & Gynecology | Admitting: Obstetrics & Gynecology

## 2022-05-11 DIAGNOSIS — N898 Other specified noninflammatory disorders of vagina: Secondary | ICD-10-CM | POA: Diagnosis present

## 2022-05-11 DIAGNOSIS — Z113 Encounter for screening for infections with a predominantly sexual mode of transmission: Secondary | ICD-10-CM | POA: Insufficient documentation

## 2022-05-11 NOTE — Progress Notes (Signed)
   NURSE VISIT- VAGINITIS/STD/POC  SUBJECTIVE:  Stephanie Cook is a 36 y.o. [id number] GYN patientfemale here for a vaginal swab for vaginitis screening, STD screen.  She reports the following symptoms: vulvar itching for a  fewdays. Denies abnormal vaginal bleeding, significant pelvic pain, fever, or UTI symptoms.  OBJECTIVE:  There were no vitals taken for this visit.  Appears well, in no apparent distress  ASSESSMENT: Vaginal swab for STD screen  PLAN: Self-collected vaginal probe for Gonorrhea, Chlamydia, Trichomonas, Bacterial Vaginosis, Yeast sent to lab Treatment: to be determined once results are received Follow-up as needed if symptoms persist/worsen, or new symptoms develop  Annamarie Dawley  05/11/2022 10:55 AM

## 2022-05-16 LAB — CERVICOVAGINAL ANCILLARY ONLY
Bacterial Vaginitis (gardnerella): NEGATIVE
Candida Glabrata: NEGATIVE
Candida Vaginitis: POSITIVE — AB
Chlamydia: NEGATIVE
Comment: NEGATIVE
Comment: NEGATIVE
Comment: NEGATIVE
Comment: NEGATIVE
Comment: NEGATIVE
Comment: NORMAL
Neisseria Gonorrhea: NEGATIVE
Trichomonas: NEGATIVE

## 2022-05-18 ENCOUNTER — Other Ambulatory Visit: Payer: Self-pay | Admitting: Obstetrics & Gynecology

## 2022-05-18 DIAGNOSIS — N760 Acute vaginitis: Secondary | ICD-10-CM

## 2022-05-18 MED ORDER — FLUCONAZOLE 150 MG PO TABS: 150.0000 mg | ORAL_TABLET | Freq: Once | ORAL | 0 refills | Status: AC

## 2022-05-22 ENCOUNTER — Ambulatory Visit (INDEPENDENT_AMBULATORY_CARE_PROVIDER_SITE_OTHER): Payer: Medicaid Other | Admitting: Adult Health

## 2022-05-22 ENCOUNTER — Encounter: Payer: Self-pay | Admitting: Adult Health

## 2022-05-22 VITALS — BP 126/85 | HR 86 | Ht 62.0 in | Wt 217.5 lb

## 2022-05-22 DIAGNOSIS — F17200 Nicotine dependence, unspecified, uncomplicated: Secondary | ICD-10-CM | POA: Diagnosis not present

## 2022-05-22 DIAGNOSIS — F419 Anxiety disorder, unspecified: Secondary | ICD-10-CM | POA: Diagnosis not present

## 2022-05-22 DIAGNOSIS — I10 Essential (primary) hypertension: Secondary | ICD-10-CM | POA: Diagnosis not present

## 2022-05-22 DIAGNOSIS — Z01419 Encounter for gynecological examination (general) (routine) without abnormal findings: Secondary | ICD-10-CM | POA: Insufficient documentation

## 2022-05-22 DIAGNOSIS — F32A Depression, unspecified: Secondary | ICD-10-CM

## 2022-05-22 DIAGNOSIS — Z3042 Encounter for surveillance of injectable contraceptive: Secondary | ICD-10-CM

## 2022-05-22 MED ORDER — AMLODIPINE BESYLATE 10 MG PO TABS: 10.0000 mg | ORAL_TABLET | Freq: Every day | ORAL | 1 refills | Status: DC

## 2022-05-22 MED ORDER — MEDROXYPROGESTERONE ACETATE 150 MG/ML IM SUSP: INTRAMUSCULAR | 4 refills | Status: DC

## 2022-05-22 MED ORDER — ESCITALOPRAM OXALATE 10 MG PO TABS: 10.0000 mg | ORAL_TABLET | Freq: Every day | ORAL | 2 refills | Status: DC

## 2022-05-22 NOTE — Progress Notes (Signed)
Patient ID: Stephanie Cook, female   DOB: 03/16/1986, 36 y.o.   MRN: [id number] History of Present Illness: Stephanie Cook is a 36 year old black female, married to female, [id number] in for a well woman gyn exam.  Lab Results  Component Value Date   DIAGPAP  05/11/2020    - Negative for intraepithelial lesion or malignancy (NILM)   HPVHIGH Negative 05/11/2020   PCP is [facility].  Current Medications, Allergies, Past Medical History, Past Surgical History, Family History and Social History were reviewed in [employer] record.     Review of Systems: Patient denies any headaches, hearing loss, fatigue, blurred vision, shortness of breath, chest pain, abdominal pain, problems with bowel movements, urination, or intercourse. No joint pain or mood swings.  Happy with dep, no period, has noticed dark area in vagina    Physical Exam:BP 126/85 (BP Location: Left Arm, Patient Position: Sitting, Cuff Size: Large)   Pulse 86   Ht 5\' 2"  (1.575 m)   Wt 217 lb 8 oz (98.7 kg)   BMI 39.78 kg/m   General:  Well developed, well nourished, no acute distress Skin:  Warm and dry Neck:  Midline trachea, normal thyroid, good ROM, no lymphadenopathy Lungs; Clear to auscultation bilaterally Breast:  No dominant palpable mass, retraction, or nipple discharge Cardiovascular: Regular rate and rhythm Abdomen:  Soft, non tender, no hepatosplenomegaly Pelvic:  External genitalia is normal in appearance, no lesions.  The vagina is normal in appearance, dark skin color right inner labia. Urethra has no lesions or masses. The cervix is bulbous.  Uterus is felt to be normal size, shape, and contour.  No adnexal masses or tenderness noted.Bladder is non tender, no masses felt. Extremities/musculoskeletal:  No swelling or varicosities noted, no clubbing or cyanosis Psych:  No mood changes, alert and cooperative,seems happy AA is 4 Fall risk is low    05/22/2022   10:24 AM 05/11/2020    9:33 AM 08/08/2018     2:02 PM  Depression screen PHQ 2/9  Decreased Interest 1 0 2  Down, Depressed, Hopeless 2 0 0  PHQ - 2 Score 3 0 2  Altered sleeping 2 0 1  Tired, decreased energy 2 1 2   Change in appetite 1 0 0  Feeling bad or failure about yourself  3 0 0  Trouble concentrating 1 0 0  Moving slowly or fidgety/restless 1 0 0  Suicidal thoughts 1 0 0  PHQ-9 Score 14 1 5   Difficult doing work/chores  Not difficult at all        05/22/2022   10:24 AM 05/11/2020    9:34 AM  GAD 7 : Generalized Anxiety Score  Nervous, Anxious, on Edge 2 0  Control/stop worrying 1 0  Worry too much - different things 2 0  Trouble relaxing 2 0  Restless 2 0  Easily annoyed or irritable 3 0  Afraid - awful might happen 1 0  Total GAD 7 Score 13 0    Upstream - 05/22/22 1018       Pregnancy Intention Screening   Does the patient want to become pregnant in the next year? No    Does the patient's partner want to become pregnant in the next year? No    Would the patient like to discuss contraceptive options today? No      Contraception Wrap Up   Current Method Hormonal Injection    End Method Hormonal Injection  Examination chaperoned by Malachy Mood LPN  Impression and Plan: 1. Encounter for well woman exam with routine gynecological exam Pap and physical in 1 year Labs at [facility]  2. Smoker She is not ready to stop smoking says has 5 kids  3. Hypertension, unspecified type Will continue Norvasc 10 mg 1 daily   Meds ordered this encounter  Medications   amLODipine (NORVASC) 10 MG tablet    Sig: Take 1 tablet (10 mg total) by mouth daily.    Dispense:  90 tablet    Refill:  1    Order Specific Question:   Supervising Provider    Answer:   Despina Hidden, LUTHER H [[id number]]   medroxyPROGESTERone (DEPO-PROVERA) 150 MG/ML injection    Sig: INJECT INTO THE MUSCLE ONCE EVERY 3 MONTHS.    Dispense:  1 mL    Refill:  4    Order Specific Question:   Supervising Provider    Answer:   Despina Hidden, LUTHER  H [[id number]]   escitalopram (LEXAPRO) 10 MG tablet    Sig: Take 1 tablet (10 mg total) by mouth daily.    Dispense:  30 tablet    Refill:  2    Order Specific Question:   Supervising Provider    Answer:   Despina Hidden, LUTHER H [[id number]]     4. Anxiety and depression Will start on lexapro 10 mg daily and follow up in 8 weeks for ROS Discussed possible counseling too   5. Encounter for surveillance of injectable contraceptive Will refill depo

## 2022-05-24 ENCOUNTER — Encounter: Payer: Self-pay | Admitting: Nurse Practitioner

## 2022-05-24 ENCOUNTER — Ambulatory Visit (INDEPENDENT_AMBULATORY_CARE_PROVIDER_SITE_OTHER): Payer: Medicaid Other | Admitting: Nurse Practitioner

## 2022-05-24 VITALS — BP 146/102 | HR 70 | Ht 62.0 in | Wt 216.0 lb

## 2022-05-24 DIAGNOSIS — F321 Major depressive disorder, single episode, moderate: Secondary | ICD-10-CM | POA: Diagnosis not present

## 2022-05-24 DIAGNOSIS — F419 Anxiety disorder, unspecified: Secondary | ICD-10-CM | POA: Diagnosis not present

## 2022-05-24 DIAGNOSIS — I10 Essential (primary) hypertension: Secondary | ICD-10-CM

## 2022-05-24 DIAGNOSIS — Z6835 Body mass index (BMI) 35.0-35.9, adult: Secondary | ICD-10-CM

## 2022-05-24 DIAGNOSIS — F17200 Nicotine dependence, unspecified, uncomplicated: Secondary | ICD-10-CM

## 2022-05-24 MED ORDER — UNABLE TO FIND: 0 refills | Status: DC

## 2022-05-24 NOTE — Assessment & Plan Note (Signed)
PHQ-9 score 9 Started taking Lexapro 10 mg daily a day ago Denies SI, HI Has upcoming follow-up with OB/GYN about her condition Patient referred to [facility] for counseling

## 2022-05-24 NOTE — Assessment & Plan Note (Signed)
BP Readings from Last 3 Encounters:  05/24/22 (!) 146/102  05/22/22 126/85  04/05/22 (!) 141/99  Currently on amlodipine 10 mg daily Reported that she has not taking her medication today Continue current medication DASH diet advised, engage in regular vigorous exercises at least 150 minutes weekly Patient counseled on smoking cessation Blood pressure monitor ordered today BP goal is less than 140/90 Follow-up in 4 months

## 2022-05-24 NOTE — Patient Instructions (Signed)
Pleas order a blood pressure machine today goal for your blood pressure is less than 140/90    It is important that you exercise regularly at least 30 minutes 5 times a week.  Think about what you will eat, plan ahead. Choose " clean, green, fresh or frozen" over canned, processed or packaged foods which are more sugary, salty and fatty. 70 to 75% of food eaten should be vegetables and fruit. Three meals at set times with snacks allowed between meals, but they must be fruit or vegetables. Aim to eat over a 12 hour period , example 7 am to 7 pm, and STOP after  your last meal of the day. Drink water,generally about 64 ounces per day, no other drink is as healthy. Fruit juice is best enjoyed in a healthy way, by EATING the fruit.  Thanks for choosing [facility], we consider it a privelige to serve you.

## 2022-05-24 NOTE — Assessment & Plan Note (Signed)
GAD 7 score 17 Started taking Lexapro 10 mg daily a day ago Denies SI, HI Has upcoming follow-up with OB/GYN about her condition Patient referred to [facility] for counseling

## 2022-05-24 NOTE — Progress Notes (Signed)
New Patient Office Visit  Subjective    Patient ID: Stephanie Cook, female    DOB: 20-Jan-1986  Age: 36 y.o. MRN: [id number]  CC:  Chief Complaint  Patient presents with   New Patient (Initial Visit)    np    HPI [place] with past medical history of hypertension, depression and anxiety, cigarette nicotine dependence, presents to establish care for her chronic medical conditions, previous PCP was at the [facility] department , last visit was a month ago .  Stated that she had some labs done last month, records requested from [facility] department today.   Goes to family history for her OB/GYN needs last annual physical was 2 days ago.    HTN.  Takes amlodipine 10 mg daily ,states that she has not taken her BP meds today due to rushing here to meet her appointment, pt denies CP, dizziness, syncope, edema .  Tobacco smoking. Started smoking at age 54, smokes a pack of cigarettes for about three days, not ready to quit now..   Anxiety And depression She started taking lexapro 10mg  daily a day ago, denies SI, HI . She does not know what is causing her anxiety and depression. [facility] has [facility]past but she did not like how they takled to her .     Outpatient Encounter Medications as of 05/24/2022  Medication Sig   amLODipine (NORVASC) 10 MG tablet Take 1 tablet (10 mg total) by mouth daily.   escitalopram (LEXAPRO) 10 MG tablet Take 1 tablet (10 mg total) by mouth daily.   medroxyPROGESTERone (DEPO-PROVERA) 150 MG/ML injection INJECT 07/24/2022 INTO THE MUSCLE ONCE EVERY 3 MONTHS.   UNABLE TO FIND Blood pressure cuff DX:I10   [DISCONTINUED] fluticasone (FLONASE) 50 MCG/ACT nasal spray Place 1 spray into both nostrils daily for 14 days.   No facility-administered encounter medications on file as of 05/24/2022.    Past Medical History:  Diagnosis Date   Depression    Frequent urination 09/28/2015   HSV-2 (herpes simplex virus 2)  infection    Hx of chlamydia infection    multiple times   Hx of gonorrhea    Hx of trichomoniasis    recurrent, 4 times   Hypertension    Missed period 09/28/2015   Pregnant    Preterm labor    STD (female)    chlamydia, trichomonas, gonorrhea, HPV, HSV    Past Surgical History:  Procedure Laterality Date   NO PAST SURGERIES      Family History  Problem Relation Age of Onset   Lupus Mother    Cirrhosis Mother    ADD / ADHD Mother    Schizophrenia Mother    Heart disease Father        had open heart surgery , not sure of what age and type of heart surgery   Hypertension Father    Hypertension Maternal Grandmother    Colon cancer Neg Hx    Breast cancer Neg Hx     Social History   Socioeconomic History   Marital status: Married    Spouse name: lauren grady   Number of children: 5   Years of education: high school   Highest education level: 9th grade  Occupational History   Not on file  Tobacco Use   Smoking status: Every Day    Packs/day: 0.50    Years: 8.00    Pack years: 4.00    Types: Cigarettes   Smokeless  tobacco: Never  Vaping Use   Vaping Use: Never used  Substance and Sexual Activity   Alcohol use: Yes    Comment: occasionally   Drug use: Not Currently   Sexual activity: Yes    Birth control/protection: Injection  Other Topics Concern   Not on file  Social History Narrative   Lives with her children and her spouse.    Social Determinants of Health   Financial Resource Strain: High Risk   Difficulty of Paying Living Expenses: Very hard  Food Insecurity: Food Insecurity Present   Worried About Programme researcher, broadcasting/film/videounning Out of Food in the Last Year: Often true   Baristaan Out of Food in the Last Year: Often true  Transportation Needs: No Transportation Needs   Lack of Transportation (Medical): No   Lack of Transportation (Non-Medical): No  Physical Activity: Insufficiently Active   Days of Exercise per Week: 3 days   Minutes of Exercise per Session: 40 min   Stress: Stress Concern Present   Feeling of Stress : Very much  Social Connections: Moderately Isolated   Frequency of Communication with Friends and Family: Twice a week   Frequency of Social Gatherings with Friends and Family: Once a week   Attends Religious Services: Never   Database administratorActive Member of Clubs or Organizations: No   Attends Engineer, structuralClub or Organization Meetings: Never   Marital Status: Married  Catering managerntimate Partner Violence: Not At Risk   Fear of Current or Ex-Partner: No   Emotionally Abused: No   Physically Abused: No   Sexually Abused: No    Review of Systems  Constitutional: Negative.  Negative for chills, fever and weight loss.  Respiratory: Negative.  Negative for cough, hemoptysis, sputum production, shortness of breath and wheezing.   Cardiovascular: Negative.  Negative for chest pain, palpitations, orthopnea, claudication, leg swelling and PND.  Genitourinary: Negative.  Negative for dysuria, frequency and urgency.  Neurological:  Negative for dizziness, tingling and headaches.  Psychiatric/Behavioral:  Positive for depression. Negative for hallucinations, substance abuse and suicidal ideas. The patient is nervous/anxious. The patient does not have insomnia.        Objective    BP (!) 146/102 (BP Location: Right Arm, Cuff Size: Normal)   Pulse 70   Ht 5\' 2"  (1.575 m)   Wt 216 lb (98 kg)   SpO2 93%   BMI 39.51 kg/m   Physical Exam Constitutional:      General: She is not in acute distress.    Appearance: She is obese. She is not ill-appearing, toxic-appearing or diaphoretic.  Cardiovascular:     Rate and Rhythm: Normal rate and regular rhythm.     Pulses: Normal pulses.     Heart sounds: Normal heart sounds. No murmur heard.   No friction rub. No gallop.  Pulmonary:     Effort: Pulmonary effort is normal. No respiratory distress.     Breath sounds: Normal breath sounds. No stridor. No wheezing, rhonchi or rales.  Chest:     Chest wall: No tenderness.   Abdominal:     Palpations: Abdomen is soft.     Tenderness: There is no abdominal tenderness.  Skin:    Capillary Refill: Capillary refill takes less than 2 seconds.  Neurological:     Mental Status: She is alert and oriented to person, place, and time.  Psychiatric:        Mood and Affect: Mood normal.        Behavior: Behavior normal.        Thought  Content: Thought content normal.        Judgment: Judgment normal.        Assessment & Plan:   Problem List Items Addressed This Visit       Cardiovascular and Mediastinum   Essential hypertension, benign    BP Readings from Last 3 Encounters:  05/24/22 (!) 146/102  05/22/22 126/85  04/05/22 (!) 141/99  Currently on amlodipine 10 mg daily Reported that she has not taking her medication today Continue current medication DASH diet advised, engage in regular vigorous exercises at least 150 minutes weekly Patient counseled on smoking cessation Blood pressure monitor ordered today BP goal is less than 140/90 Follow-up in 4 months        Other   Depression - Primary    PHQ-9 score 9 Started taking Lexapro 10 mg daily a day ago Denies SI, HI Has upcoming follow-up with OB/GYN about her condition Patient referred to [facility] for counseling       Relevant Orders   AMB Referral to [facility]ordin[facility]   Obesity    Wt Readings from Last 3 Encounters:  05/24/22 216 lb (98 kg)  05/22/22 217 lb 8 oz (98.7 kg)  04/05/22 210 lb (95.3 kg)  Need to increase intake of whole food consisting mainly vegetables and protein less carbohydrate drinking at least 64 ounces of water daily instead of juice soda, engaging in regular vigorous exercises at least 150 minutes weekly, importance of portion control also discussed with patient.  Benefits of healthy weight discussed with patient she verbalized understanding      Anxiety    GAD 7 score 17 Started taking Lexapro 10 mg daily a day ago Denies SI, HI Has upcoming follow-up with  OB/GYN about her condition Patient referred to [facility] for counseling       Relevant Orders   AMB Referral to [facility] Care Coordinaton   Smoker    Smokes about one pack , one pack lasts 3 days   Asked about quitting: confirms that he/she currently smokes cigarettes Advise to quit smoking: Educated about QUITTING to reduce the risk of cancer, cardio and cerebrovascular disease. Assess willingness: Unwilling to quit at this time, not working on cutting back. Assist with counseling and pharmacotherapy: Counseled for 5 minutes and literature provided. Arrange for follow up: follow up in 4 months and continue to offer help.       Return in about 4 months (around 09/23/2022) for follw up for HTN.   Donell Beers, FNP

## 2022-05-24 NOTE — Assessment & Plan Note (Signed)
Smokes about one pack , one pack lasts 3 days   Asked about quitting: confirms that he/she currently smokes cigarettes Advise to quit smoking: Educated about QUITTING to reduce the risk of cancer, cardio and cerebrovascular disease. Assess willingness: Unwilling to quit at this time, not working on cutting back. Assist with counseling and pharmacotherapy: Counseled for 5 minutes and literature provided. Arrange for follow up: follow up in 4 months and continue to offer help.

## 2022-05-24 NOTE — Assessment & Plan Note (Signed)
Wt Readings from Last 3 Encounters:  05/24/22 216 lb (98 kg)  05/22/22 217 lb 8 oz (98.7 kg)  04/05/22 210 lb (95.3 kg)  Need to increase intake of whole food consisting mainly vegetables and protein less carbohydrate drinking at least 64 ounces of water daily instead of juice soda, engaging in regular vigorous exercises at least 150 minutes weekly, importance of portion control also discussed with patient.  Benefits of healthy weight discussed with patient she verbalized understanding

## 2022-05-30 ENCOUNTER — Ambulatory Visit: Payer: Medicaid Other | Admitting: Nurse Practitioner

## 2022-06-08 ENCOUNTER — Ambulatory Visit (INDEPENDENT_AMBULATORY_CARE_PROVIDER_SITE_OTHER): Payer: Medicaid Other | Admitting: *Deleted

## 2022-06-08 DIAGNOSIS — Z3042 Encounter for surveillance of injectable contraceptive: Secondary | ICD-10-CM

## 2022-06-08 MED ORDER — MEDROXYPROGESTERONE ACETATE 150 MG/ML IM SUSP
150.0000 mg | Freq: Once | INTRAMUSCULAR | Status: AC
  Administered 2022-06-08: 150 mg via INTRAMUSCULAR

## 2022-06-15 ENCOUNTER — Other Ambulatory Visit: Payer: Self-pay

## 2022-06-15 ENCOUNTER — Encounter (HOSPITAL_COMMUNITY): Payer: Self-pay | Admitting: *Deleted

## 2022-06-15 ENCOUNTER — Emergency Department (HOSPITAL_COMMUNITY): Payer: Medicaid Other

## 2022-06-15 ENCOUNTER — Emergency Department (HOSPITAL_COMMUNITY)
Admission: EM | Admit: 2022-06-15 | Discharge: 2022-06-15 | Disposition: A | Discharge status: Home / Self Care | Payer: Medicaid Other | Attending: Emergency Medicine | Admitting: Emergency Medicine

## 2022-06-15 DIAGNOSIS — Z79899 Other long term (current) drug therapy: Secondary | ICD-10-CM | POA: Insufficient documentation

## 2022-06-15 DIAGNOSIS — M79605 Pain in left leg: Secondary | ICD-10-CM | POA: Diagnosis present

## 2022-06-15 DIAGNOSIS — M79662 Pain in left lower leg: Secondary | ICD-10-CM | POA: Diagnosis not present

## 2022-06-15 HISTORY — DX: Anxiety disorder, unspecified: F41.9

## 2022-06-15 MED ORDER — HYDROCODONE-ACETAMINOPHEN 5-325 MG PO TABS
1.0000 | ORAL_TABLET | Freq: Once | ORAL | Status: AC
  Administered 2022-06-15: 1 via ORAL
  Filled 2022-06-15: qty 1

## 2022-06-15 MED ORDER — HYDROCODONE-ACETAMINOPHEN 5-325 MG PO TABS: 1.0000 | ORAL_TABLET | Freq: Four times a day (QID) | ORAL | 0 refills | Status: DC | PRN

## 2022-06-15 NOTE — ED Provider Notes (Signed)
[facility] EMERGENCY DEPARTMENT Provider Note   CSN: [id number] Arrival date & time: 06/15/22  3825     History  Chief Complaint  Patient presents with   Leg Pain    Stephanie Cook is a 36 y.o. female presenting with left lower extremity pain which she woke with 3 days ago.  She was at a waterpark the day before but denies any specific injuries. Her pain localizes to her left posterior calf, described as sharp pain which is worsened with weight bearing, palpation and movement, better at rest.  Pain is worsened with ankle flexion as well and radiates to the lower left thigh.  She has tried ibuprofen and tylenol without sx relief. No history of dvt, she is on depo provera for birth control.   The history is provided by the patient.       Home Medications Prior to Admission medications   Medication Sig Start Date End Date Taking? Authorizing Provider  HYDROcodone-acetaminophen (NORCO/VICODIN) 5-325 MG tablet Take 1 tablet by mouth every 6 (six) hours as needed for severe pain. 06/15/22  Yes Alvey Brockel, Raynelle Fanning, PA-C  amLODipine (NORVASC) 10 MG tablet Take 1 tablet (10 mg total) by mouth daily. 05/22/22   Adline Potter, NP  escitalopram (LEXAPRO) 10 MG tablet Take 1 tablet (10 mg total) by mouth daily. 05/22/22 05/22/23  Adline Potter, NP  medroxyPROGESTERone (DEPO-PROVERA) 150 MG/ML injection INJECT INTO THE MUSCLE ONCE EVERY 3 MONTHS. 05/22/22   Adline Potter, NP  UNABLE TO FIND Blood pressure cuff DX:I10 05/24/22   Paseda, Baird Kay, FNP      Allergies    Coconut fatty acids, Ibuprofen, and Tramadol hcl    Review of Systems   Review of Systems  Constitutional:  Negative for chills and fever.  Respiratory:  Negative for shortness of breath.   Cardiovascular:  Negative for chest pain, palpitations and leg swelling.  Musculoskeletal:  Positive for arthralgias and myalgias. Negative for joint swelling.  Skin:  Negative for color change and wound.  Neurological:   Negative for weakness and numbness.  All other systems reviewed and are negative.   Physical Exam Updated Vital Signs BP (!) 135/91 (BP Location: Right Arm)   Pulse 76   Temp 98.5 F (36.9 C) (Oral)   Resp 16   Ht 5\' 2"  (1.575 m)   Wt 98.9 kg   SpO2 99%   BMI 39.87 kg/m  Physical Exam Vitals and nursing note reviewed.  Constitutional:      Appearance: She is well-developed.  HENT:     Head: Normocephalic and atraumatic.  Eyes:     Conjunctiva/sclera: Conjunctivae normal.  Cardiovascular:     Rate and Rhythm: Normal rate and regular rhythm.  Pulmonary:     Effort: Pulmonary effort is normal.  Musculoskeletal:        General: Tenderness present. No swelling. Normal range of motion.     Cervical back: Normal range of motion.     Left knee: No swelling, effusion or erythema. Tenderness present. No LCL laxity, MCL laxity, ACL laxity or PCL laxity.Normal pulse.     Right lower leg: No edema.     Left lower leg: No edema.     Comments: No appreciable unilateral lower extremity edema.  She is tender to palpation along the medial and posterior left gastroc musculature.  There are no palpable cords, no streaking erythema.  There is no point tenderness at the anterior knee, but there is some tenderness to palpation  posterior popliteal space.  Dorsalis pedal pulses are full and equal bilaterally.  No ankle edema.  Positive Homans' sign.  Skin:    General: Skin is warm and dry.  Neurological:     Mental Status: She is alert.     ED Results / Procedures / Treatments   Labs (all labs ordered are listed, but only abnormal results are displayed) Labs Reviewed - No data to display  EKG None  Radiology US Venous Img Lower Unilateral Left  Result Date: 06/15/2022 CLINICAL DATA:  Left leg and calf pain EXAM: LEFT LOWER EXTREMITY VENOUS DOPPLER ULTRASOUND TECHNIQUE: Gray-scale sonography with compression, as well as color and duplex ultrasound, were performed to evaluate the deep  venous system(s) from the level of the common femoral vein through the popliteal and proximal calf veins. COMPARISON:  None Available. FINDINGS: VENOUS Normal compressibility of the common femoral, superficial femoral, and popliteal veins, as well as the visualized calf veins. Visualized portions of profunda femoral vein and great saphenous vein unremarkable. No filling defects to suggest DVT on grayscale or color Doppler imaging. Doppler waveforms show normal direction of venous flow, normal respiratory plasticity and response to augmentation. Limited views of the contralateral common femoral vein are unremarkable. OTHER None. Limitations: none IMPRESSION: No evidence of left lower extremity DVT. Electronically Signed   By: Guadlupe Spanish M.D.   On: 06/15/2022 10:20   DG Knee Complete 4 Views Left  Result Date: 06/15/2022 CLINICAL DATA:  LEFT leg and LEFT knee pain down to ankle for 3 days, denies injury EXAM: LEFT KNEE - COMPLETE 4+ VIEW COMPARISON:  Portable exam 0953 hours without priors for comparison FINDINGS: Osseous mineralization normal. Joint spaces preserved. No fracture, dislocation, or bone destruction. No joint effusion. IMPRESSION: No osseous abnormalities. Electronically Signed   By: Ulyses Southward M.D.   On: 06/15/2022 10:04    Procedures Procedures    Medications Ordered in ED Medications  HYDROcodone-acetaminophen (NORCO/VICODIN) 5-325 MG per tablet 1 tablet (has no administration in time range)    ED Course/ Medical Decision Making/ A&P                           Medical Decision Making Exam is favoring muscle strain but given the patient's risk factor of estrogen exposure will obtain an ultrasound study to rule out DVT.  Imaging today is negative, exam is normal except for calf pain with palpation.  Patient was encouraged activity as tolerated, heat therapy.  She was prescribed a small quantity of hydrocodone, also encouraged to continue her ibuprofen and follow-up with her  provider if her symptoms or not improving over the next week.  Amount and/or Complexity of Data Reviewed Radiology: ordered.    Details: Plain film imaging of the left knee is negative.  Ultrasound is negative for DVT.  Risk Prescription drug management. Risk Details: Patient was stable at time of discharge and emergent conditions have been ruled out.           Final Clinical Impression(s) / ED Diagnoses Final diagnoses:  Pain of left calf    Rx / DC Orders ED Discharge Orders          Ordered    HYDROcodone-acetaminophen (NORCO/VICODIN) 5-325 MG tablet  Every 6 hours PRN        06/15/22 1047              Burgess Amor, PA-C 06/15/22 1051    Bethann Berkshire, MD 06/16/22 1818

## 2022-06-15 NOTE — Discharge Instructions (Signed)
Your knee x-ray is normal and your ultrasound is negative for any deep vein thrombosis (blood clot).  I suspect you may have strained your calf muscle.  I recommend continuing to use your home ibuprofen, also application of a heating pad for 15 to 20 minutes 2-3 times daily may also be helpful.  You have been prescribed a small quantity of hydrocodone which is a narcotic pain reliever, use this sparingly but if needed for pain relief.  Do not drive within 4 hours of taking this medication as it will make you drowsy.  Plan to follow-up with your family provider if your symptoms are not improving over the next week.

## 2022-06-15 NOTE — ED Triage Notes (Signed)
Pt c/o left leg pain from right above the knee down to the ankle x 3 days. Denies injury.

## 2022-06-21 ENCOUNTER — Encounter: Payer: Self-pay | Admitting: Nurse Practitioner

## 2022-06-21 ENCOUNTER — Ambulatory Visit: Payer: Medicaid Other | Admitting: Nurse Practitioner

## 2022-06-21 VITALS — BP 124/88 | HR 77 | Ht 62.0 in | Wt 215.0 lb

## 2022-06-21 DIAGNOSIS — M25562 Pain in left knee: Secondary | ICD-10-CM | POA: Insufficient documentation

## 2022-06-21 DIAGNOSIS — I10 Essential (primary) hypertension: Secondary | ICD-10-CM

## 2022-06-21 DIAGNOSIS — F17200 Nicotine dependence, unspecified, uncomplicated: Secondary | ICD-10-CM | POA: Diagnosis not present

## 2022-06-21 MED ORDER — METHYLPREDNISOLONE 4 MG PO TBPK: ORAL_TABLET | ORAL | 0 refills | Status: DC

## 2022-06-21 MED ORDER — CYCLOBENZAPRINE HCL 5 MG PO TABS: 5.0000 mg | ORAL_TABLET | Freq: Three times a day (TID) | ORAL | 0 refills | Status: DC | PRN

## 2022-06-21 MED ORDER — METHYLPREDNISOLONE ACETATE 80 MG/ML IJ SUSP: 80.0000 mg | Freq: Once | INTRAMUSCULAR | Status: DC

## 2022-06-21 NOTE — Progress Notes (Signed)
   Stephanie Cook     MRN: [id number]      DOB: [birth year]   HPI [name] with past medical history of hypertension, depression, obesity is here for complaints of left knee pain.  Patient complain of left knee pain since that past one week . Has sharp aching pain 8/10, localized to her knee cap area. Pain is worse with flexing her knee and walking. states that hydrocodone workes well for her pain  . She has tried tylenol , states that tylenol does not work. Gets hives with ibuprofen. Denies trauma, tingling , numbness, sweeling    ROS Denies recent fever or chills. Denies sinus pressure, nasal congestion, ear pain or sore throat. Denies chest congestion, productive cough or wheezing. Denies chest pains, palpitations and leg swelling Denies abdominal pain, nausea, vomiting,diarrhea or constipation.   Denies dysuria, frequency, hesitancy or incontinence. Denies headaches, seizures, numbness, or tingling. Denies depression, anxiety or insomnia.    PE  BP 124/88 (BP Location: Right Arm, Patient Position: Sitting, Cuff Size: Large)   Pulse 77   Ht 5\' 2"  (1.575 m)   Wt 215 lb (97.5 kg)   SpO2 96%   BMI 39.32 kg/m   Patient alert and oriented and in no cardiopulmonary distress.   Chest: Clear to auscultation bilaterally.  CVS: S1, S2 no murmurs, no S3.Regular rate.  ABD: Soft non tender.   Ext: No edema  MS: Adequate ROM spine, shoulders, hips, has tenderness on range of motion of left knee, no effusion or ertyhema  noted, leg warm to touch has palpable pedal pulses  Skin: Intact, no ulcerations or rash noted.  Psych: Good eye contact, normal affect. Memory intact not anxious or depressed appearing.  CNS: CN 2-12 intact, power,  normal throughout.no focal deficits noted.   Assessment & Plan  Acute pain of left knee Pain t started a week ago She denies trauma Pain relieved with hydrocodone depoMedrol  80mg  injection refused by the patient  Medrol dose pack 4  mg ordered Rx Flexeril 5 mg every 8 hours as needed. Will refer to orthopedics if her pain does not get better in 1 week   Essential hypertension, benign BP Readings from Last 3 Encounters:  06/21/22 124/88  06/15/22 (!) 135/91  05/24/22 (!) 146/102  Chronic condition well-controlled on amlodipine 10 mg daily Continue current medication DASH diet advised engage in regular vigorous exercises at least 150 minutes weekly  Smoker She continues to smoke 1 pack of cigarettes daily Need to quit smoking discussed with patient Not willing to quit today we will continue to educate patient on the need to quit smoking Follow-up at next visit in 3 months

## 2022-06-21 NOTE — Addendum Note (Signed)
Addended by: Harriet Pho on: 06/21/2022 11:02 AM   Modules accepted: Orders

## 2022-06-21 NOTE — Assessment & Plan Note (Signed)
BP Readings from Last 3 Encounters:  06/21/22 124/88  06/15/22 (!) 135/91  05/24/22 (!) 146/102  Chronic condition well-controlled on amlodipine 10 mg daily Continue current medication DASH diet advised engage in regular vigorous exercises at least 150 minutes weekly

## 2022-06-21 NOTE — Assessment & Plan Note (Addendum)
She continues to smoke 1 pack of cigarettes daily Need to quit smoking discussed with patient Not willing to quit today we will continue to educate patient on the need to quit smoking Follow-up at next visit in 3 months

## 2022-06-21 NOTE — Patient Instructions (Addendum)
  Please take Flexeril 5 mg 3 times daily as needed for your knee pain Take Medrol Dosepak 4mg  as instructed for your knee pain   It is important that you exercise regularly at least 30 minutes 5 times a week.  Think about what you will eat, plan ahead. Choose " clean, green, fresh or frozen" over canned, processed or packaged foods which are more sugary, salty and fatty. 70 to 75% of food eaten should be vegetables and fruit. Three meals at set times with snacks allowed between meals, but they must be fruit or vegetables. Aim to eat over a 12 hour period , example 7 am to 7 pm, and STOP after  your last meal of the day. Drink water,generally about 64 ounces per day, no other drink is as healthy. Fruit juice is best enjoyed in a healthy way, by EATING the fruit.  Thanks for choosing [facility], we consider it a privelige to serve you.

## 2022-06-21 NOTE — Assessment & Plan Note (Addendum)
Pain t started a week ago She denies trauma Pain relieved with hydrocodone depoMedrol  80mg  injection refused by the patient  Medrol dose pack 4 mg ordered Rx Flexeril 5 mg every 8 hours as needed. Will refer to orthopedics if her pain does not get better in 1 week

## 2022-07-05 ENCOUNTER — Emergency Department (HOSPITAL_COMMUNITY)
Admission: EM | Admit: 2022-07-05 | Discharge: 2022-07-05 | Disposition: A | Discharge status: Home / Self Care | Payer: Medicaid Other | Attending: Emergency Medicine | Admitting: Emergency Medicine

## 2022-07-05 ENCOUNTER — Other Ambulatory Visit: Payer: Self-pay

## 2022-07-05 ENCOUNTER — Encounter (HOSPITAL_COMMUNITY): Payer: Self-pay | Admitting: Emergency Medicine

## 2022-07-05 DIAGNOSIS — L0501 Pilonidal cyst with abscess: Secondary | ICD-10-CM | POA: Insufficient documentation

## 2022-07-05 MED ORDER — PENTAFLUOROPROP-TETRAFLUOROETH EX AERO
INHALATION_SPRAY | CUTANEOUS | Status: DC | PRN
  Filled 2022-07-05: qty 116

## 2022-07-05 MED ORDER — DOXYCYCLINE HYCLATE 100 MG PO CAPS: 100.0000 mg | ORAL_CAPSULE | Freq: Two times a day (BID) | ORAL | 0 refills | Status: DC

## 2022-07-05 MED ORDER — HYDROCODONE-ACETAMINOPHEN 5-325 MG PO TABS: 1.0000 | ORAL_TABLET | Freq: Four times a day (QID) | ORAL | 0 refills | Status: DC | PRN

## 2022-07-05 MED ORDER — LIDOCAINE HCL (PF) 1 % IJ SOLN
30.0000 mL | Freq: Once | INTRAMUSCULAR | Status: AC
  Administered 2022-07-05: 30 mL
  Filled 2022-07-05: qty 30

## 2022-07-05 NOTE — ED Triage Notes (Signed)
Pt presents with rectal abscess x 4 days.

## 2022-07-05 NOTE — ED Notes (Signed)
Pt d/c home with visitor per MD order, Discharge summary reviewed, verbalize understanding. Ambulatory off unit. No s/s of acute distress noted at discharge,

## 2022-07-05 NOTE — ED Provider Notes (Signed)
[facility] EMERGENCY DEPARTMENT Provider Note   CSN: [id number] Arrival date & time: 07/05/22  1230     History  Chief Complaint  Patient presents with   Abscess    Stephanie Cook is a 36 y.o. female presenting for treatment of return of the pilonidal abscess which has been painful and swollen for the past 4 days.  She reports having similar symptoms at the same site on 2 separate occasions, each time the site was lanced and improved.  She has applied warm compresses to the site but it has not resolved her symptoms or resulted in drainage of the site.  She denies fevers or chills or other complaints.  She has found no alleviators for symptoms.  No other significant past medical history, patient is not diabetic.  The history is provided by the patient.       Home Medications Prior to Admission medications   Medication Sig Start Date End Date Taking? Authorizing Provider  doxycycline (VIBRAMYCIN) 100 MG capsule Take 1 capsule (100 mg total) by mouth 2 (two) times daily. 07/05/22  Yes Leauna Sharber, Raynelle Fanning, PA-C  HYDROcodone-acetaminophen (NORCO/VICODIN) 5-325 MG tablet Take 1 tablet by mouth every 6 (six) hours as needed for severe pain. 07/05/22  Yes Tahni Porchia, Raynelle Fanning, PA-C  amLODipine (NORVASC) 10 MG tablet Take 1 tablet (10 mg total) by mouth daily. 05/22/22   Adline Potter, NP  cyclobenzaprine (FLEXERIL) 5 MG tablet Take 1 tablet (5 mg total) by mouth 3 (three) times daily as needed for muscle spasms. 06/21/22   Paseda, Baird Kay, FNP  escitalopram (LEXAPRO) 10 MG tablet Take 1 tablet (10 mg total) by mouth daily. 05/22/22 05/22/23  Adline Potter, NP  medroxyPROGESTERone (DEPO-PROVERA) 150 MG/ML injection INJECT INTO THE MUSCLE ONCE EVERY 3 MONTHS. 05/22/22   Adline Potter, NP  methylPREDNISolone (MEDROL DOSEPAK) 4 MG TBPK tablet Take as instructed on the packaging 06/21/22   Donell Beers, FNP  UNABLE TO FIND Blood pressure cuff DX:I10 05/24/22   Paseda, Baird Kay, FNP       Allergies    Coconut fatty acids, Ibuprofen, and Tramadol hcl    Review of Systems   Review of Systems  Constitutional:  Negative for chills and fever.  HENT: Negative.    Eyes: Negative.   Respiratory: Negative.    Cardiovascular: Negative.   Gastrointestinal:  Negative for abdominal pain, nausea and vomiting.  Genitourinary: Negative.   Musculoskeletal:  Negative for arthralgias, joint swelling and neck pain.  Skin: Negative.  Negative for rash and wound.  Neurological: Negative.   Psychiatric/Behavioral: Negative.      Physical Exam Updated Vital Signs BP (!) 119/91   Pulse 82   Temp 98.9 F (37.2 C) (Oral)   Resp 18   Ht 5\' 2"  (1.575 m)   Wt 98.4 kg   SpO2 100%   BMI 39.69 kg/m  Physical Exam Vitals and nursing note reviewed.  Constitutional:      Appearance: She is well-developed.  HENT:     Head: Normocephalic and atraumatic.  Eyes:     Conjunctiva/sclera: Conjunctivae normal.  Cardiovascular:     Rate and Rhythm: Normal rate and regular rhythm.     Heart sounds: Normal heart sounds.  Pulmonary:     Effort: Pulmonary effort is normal.     Breath sounds: Normal breath sounds. No wheezing.  Abdominal:     General: Bowel sounds are normal.     Palpations: Abdomen is soft.  Tenderness: There is no abdominal tenderness.  Genitourinary:    Comments: Tender induration pilonidal area left of midline.  2 scars present from prior I&D procedures.  Small area of fluctuance identified without drainage or tenting.  No surrounding erythema.  Musculoskeletal:        General: Normal range of motion.     Cervical back: Normal range of motion.  Skin:    General: Skin is warm and dry.  Neurological:     Mental Status: She is alert.     ED Results / Procedures / Treatments   Labs (all labs ordered are listed, but only abnormal results are displayed) Labs Reviewed - No data to display  EKG None  Radiology No results found.  Procedures Procedures      INCISION AND DRAINAGE Performed by: Burgess Amor Consent: Verbal consent obtained. Risks and benefits: risks, benefits and alternatives were discussed Type: abscess  Body area: pilonidal, left of midline  Anesthesia: local infiltration in combination with Gebauers freeze spray  Incision was made with a scalpel.  Local anesthetic: lidocaine 1% without epinephrine  Anesthetic total: 8 ml  Complexity: complex Blunt dissection to break up loculations  Drainage: purulent  Drainage amount: small amount of purulence with moderate bleeding.  Packing material: not tolerated  Patient tolerance: Patient tolerated the procedure well with no immediate complications.    Medications Ordered in ED Medications  pentafluoroprop-tetrafluoroeth (GEBAUERS) aerosol ( Topical Given 07/05/22 1520)  lidocaine (PF) (XYLOCAINE) 1 % injection 30 mL (30 mLs Infiltration Given by Other 07/05/22 1519)    ED Course/ Medical Decision Making/ A&P                           Medical Decision Making Pt with 3rd recurrence of pilonidal abscess which appears to be adequately opened for complete resolution.  Discussed warm epsom salt soaks,  abx started.  Also discussed that she would benefit from surgical intervention given repeat infections at same site,  she is aware this will continue to recur.  She was given referral to Dr Henreitta Leber for definitive tx consideration.  Return precautions discussed.   Risk Prescription drug management. Minor surgery with no identified risk factors.           Final Clinical Impression(s) / ED Diagnoses Final diagnoses:  Pilonidal abscess    Rx / DC Orders ED Discharge Orders          Ordered    doxycycline (VIBRAMYCIN) 100 MG capsule  2 times daily        07/05/22 1606    HYDROcodone-acetaminophen (NORCO/VICODIN) 5-325 MG tablet  Every 6 hours PRN        07/05/22 1606              Burgess Amor, PA-C 07/06/22 1106    Linwood Dibbles, MD 07/08/22  [phone]

## 2022-07-05 NOTE — Discharge Instructions (Signed)
Take the entire course of antibiotics prescribed.  Do not drive within 4 hours of taking hydrocodone as this medication will make you drowsy.  I do recommend sitting in a warm tub of Epsom salt water for 15 to 20 minutes 3 times daily as this continues to heal.  This will help it heal and help remove any infection that may try to reaccumulate.  As discussed you have had 3 infections now at this site, a general surgeon can definitively remove this abscess pocket and surrounding scar tissue improve the chances that it will not return again.  Consider calling Dr. Henreitta Leber to discuss this procedure.

## 2022-07-07 ENCOUNTER — Ambulatory Visit: Payer: Medicaid Other | Admitting: Nurse Practitioner

## 2022-07-13 ENCOUNTER — Ambulatory Visit: Payer: Medicaid Other | Admitting: Nurse Practitioner

## 2022-07-17 ENCOUNTER — Encounter: Payer: Self-pay | Admitting: Adult Health

## 2022-07-17 ENCOUNTER — Ambulatory Visit (INDEPENDENT_AMBULATORY_CARE_PROVIDER_SITE_OTHER): Payer: Medicaid Other | Admitting: Adult Health

## 2022-07-17 VITALS — BP 131/97 | HR 80 | Ht 62.0 in | Wt 219.5 lb

## 2022-07-17 DIAGNOSIS — F32A Depression, unspecified: Secondary | ICD-10-CM | POA: Diagnosis not present

## 2022-07-17 DIAGNOSIS — F419 Anxiety disorder, unspecified: Secondary | ICD-10-CM

## 2022-07-17 MED ORDER — BUSPIRONE HCL 5 MG PO TABS: 5.0000 mg | ORAL_TABLET | Freq: Three times a day (TID) | ORAL | 3 refills | Status: DC

## 2022-07-17 MED ORDER — PAROXETINE HCL 10 MG PO TABS: 10.0000 mg | ORAL_TABLET | Freq: Every day | ORAL | 3 refills | Status: DC

## 2022-07-17 NOTE — Progress Notes (Signed)
Subjective:     Patient ID: [name], female   DOB: 1986/09/18, 36 y.o.   MRN: [id number]  HPI Marquetta is a 36 year old black female,married to female, [id number], in for follow up on starting lexapro. She stopped taking last week said it did not help.   PCP is F Paseda.  Lab Results  Component Value Date   DIAGPAP  05/11/2020    - Negative for intraepithelial lesion or malignancy (NILM)   HPVHIGH Negative 05/11/2020    Review of Systems +anxiety Has outbursts Not sleeping as well, wakes up  Reviewed past medical,surgical, social and family history. Reviewed medications and allergies.     Objective:   Physical Exam BP (!) 131/97 (BP Location: Left Arm, Patient Position: Sitting, Cuff Size: Large)   Pulse 80   Ht 5\' 2"  (1.575 m)   Wt 219 lb 8 oz (99.6 kg)   BMI 40.15 kg/m     Skin warm and dry.  Lungs: clear to ausculation bilaterally. Cardiovascular: regular rate and rhythm.     07/17/2022    9:08 AM 06/21/2022   10:21 AM 05/24/2022    8:41 AM  Depression screen PHQ 2/9  Decreased Interest 2 0 2  Down, Depressed, Hopeless 3 0 3  PHQ - 2 Score 5 0 5  Altered sleeping 3 0 0  Tired, decreased energy 3 0 0  Change in appetite 1 0 0  Feeling bad or failure about yourself  3 0 3  Trouble concentrating 1 0 0  Moving slowly or fidgety/restless 2 0 0  Suicidal thoughts 2 0 1  PHQ-9 Score 20 0 9  Difficult doing work/chores  Not difficult at all Somewhat difficult       07/17/2022    9:10 AM 05/24/2022    9:05 AM 05/22/2022   10:24 AM 05/11/2020    9:34 AM  GAD 7 : Generalized Anxiety Score  Nervous, Anxious, on Edge 3 2 2  0  Control/stop worrying 3 3 1  0  Worry too much - different things 3 3 2  0  Trouble relaxing 3 3 2  0  Restless 2 3 2  0  Easily annoyed or irritable 3 3 3  0  Afraid - awful might happen 3 0 1 0  Total GAD 7 Score 20 17 13  0  Anxiety Difficulty  Somewhat difficult        Upstream - 07/17/22 0908       Pregnancy Intention Screening   Does  the patient want to become pregnant in the next year? No    Does the patient's partner want to become pregnant in the next year? No    Would the patient like to discuss contraceptive options today? No      Contraception Wrap Up   Current Method Hormonal Injection    End Method Hormonal Injection             Assessment:     1. Anxiety and depression   Feels anxious, has outbursts, and not sleeping well will wake, up, she stopped lexapro last week' Will try paxil and bispar Meds ordered this encounter  Medications   PARoxetine (PAXIL) 10 MG tablet    Sig: Take 1 tablet (10 mg total) by mouth daily.    Dispense:  30 tablet    Refill:  3    Order Specific Question:   Supervising Provider    Answer:   , LUTHER H [[id number]]   busPIRone (BUSPAR) 5 MG tablet  Sig: Take 1 tablet (5 mg total) by mouth 3 (three) times daily.    Dispense:  90 tablet    Refill:  3    Order Specific Question:   Supervising Provider    Answer:   Lazaro Arms [[id number]]   Will refer to [facility]  She has job interview today at [employer]:     Follow up with me in 6 weeks

## 2022-07-18 ENCOUNTER — Ambulatory Visit: Payer: Medicaid Other | Admitting: Nurse Practitioner

## 2022-07-26 ENCOUNTER — Ambulatory Visit: Payer: Medicaid Other | Admitting: Nurse Practitioner

## 2022-08-15 ENCOUNTER — Ambulatory Visit: Payer: Medicaid Other | Admitting: Surgery

## 2022-08-28 ENCOUNTER — Ambulatory Visit (INDEPENDENT_AMBULATORY_CARE_PROVIDER_SITE_OTHER): Payer: Medicaid Other | Admitting: Adult Health

## 2022-08-28 ENCOUNTER — Encounter: Payer: Self-pay | Admitting: Adult Health

## 2022-08-28 VITALS — BP 125/95 | HR 65 | Ht 62.0 in | Wt 215.5 lb

## 2022-08-28 DIAGNOSIS — F32A Depression, unspecified: Secondary | ICD-10-CM

## 2022-08-28 DIAGNOSIS — F419 Anxiety disorder, unspecified: Secondary | ICD-10-CM

## 2022-08-28 MED ORDER — BUSPIRONE HCL 5 MG PO TABS: 5.0000 mg | ORAL_TABLET | Freq: Three times a day (TID) | ORAL | 6 refills | Status: DC

## 2022-08-28 MED ORDER — PAROXETINE HCL 10 MG PO TABS: 10.0000 mg | ORAL_TABLET | Freq: Every day | ORAL | 6 refills | Status: DC

## 2022-08-28 NOTE — Progress Notes (Signed)
  Subjective:     Patient ID: Stephanie Cook, female   DOB: 1986/09/28, 36 y.o.   MRN: [id number]  HPI Stephanie Cook is a 32 year old black female, married, to female,[id number], back in follow up on taking Paxil and Buspar for anxiety and depression and feeling much better,she ran out of meds 2 days ago, will try to get Paxil today, and Buspar tomorrow,when gets child support. She thinks she had tele visit with [facility] tomorrow. She says wife says she is much better.   Last pap 05/11/20. High risk HPV Negative   Neisseria Gonorrhea Negative   Chlamydia Negative   Adequacy Satisfactory for evaluation; transformation zone component PRESENT.   Diagnosis - Negative for intraepithelial lesion or malignancy (NILM)   PCP is F. Paseda  Review of Systems Anxiety and depression much better Reviewed past medical,surgical, social and family history. Reviewed medications and allergies.     Objective:   Physical Exam BP (!) 125/95 (BP Location: Left Arm, Patient Position: Sitting, Cuff Size: Normal)   Pulse 65   Ht 5\' 2"  (1.575 m)   Wt 215 lb 8 oz (97.8 kg)   BMI 39.42 kg/m     Skin warm and dry.  Lungs: clear to ausculation bilaterally. Cardiovascular: regular rate and rhythm.     08/28/2022    8:56 AM 07/17/2022    9:08 AM 06/21/2022   10:21 AM  Depression screen PHQ 2/9  Decreased Interest 1 2 0  Down, Depressed, Hopeless 1 3 0  PHQ - 2 Score 2 5 0  Altered sleeping 3 3 0  Tired, decreased energy 2 3 0  Change in appetite 1 1 0  Feeling bad or failure about yourself  3 3 0  Trouble concentrating 0 1 0  Moving slowly or fidgety/restless 0 2 0  Suicidal thoughts 0 2 0  PHQ-9 Score 11 20 0  Difficult doing work/chores   Not difficult at all       08/28/2022    8:58 AM 07/17/2022    9:10 AM 05/24/2022    9:05 AM 05/22/2022   10:24 AM  GAD 7 : Generalized Anxiety Score  Nervous, Anxious, on Edge 1 3 2 2   Control/stop worrying 2 3 3 1   Worry too much - different things 2 3 3 2   Trouble relaxing  2 3 3 2   Restless 1 2 3 2   Easily annoyed or irritable 2 3 3 3   Afraid - awful might happen 0 3 0 1  Total GAD 7 Score 10 20 17 13   Anxiety Difficulty   Somewhat difficult       Upstream - 08/28/22 0854       Pregnancy Intention Screening   Does the patient want to become pregnant in the next year? No    Does the patient's partner want to become pregnant in the next year? No    Would the patient like to discuss contraceptive options today? No      Contraception Wrap Up   Current Method Hormonal Injection    End Method Hormonal Injection             Assessment:     1. Anxiety and depression Feeling much better Will continue Paxil 10 mg daily and Buspar 5 mg tid    Keep appt with BH  Plan:     Follow up in 3 months for ROS

## 2022-08-31 ENCOUNTER — Telehealth (HOSPITAL_COMMUNITY): Payer: Medicaid Other | Admitting: Psychiatry

## 2022-08-31 ENCOUNTER — Ambulatory Visit (INDEPENDENT_AMBULATORY_CARE_PROVIDER_SITE_OTHER): Payer: Medicaid Other | Admitting: *Deleted

## 2022-08-31 DIAGNOSIS — Z3042 Encounter for surveillance of injectable contraceptive: Secondary | ICD-10-CM

## 2022-08-31 MED ORDER — MEDROXYPROGESTERONE ACETATE 150 MG/ML IM SUSP
150.0000 mg | Freq: Once | INTRAMUSCULAR | Status: AC
  Administered 2022-08-31: 150 mg via INTRAMUSCULAR

## 2022-08-31 NOTE — Progress Notes (Signed)
   NURSE VISIT- INJECTION  SUBJECTIVE:  Stephanie Cook is a 36 y.o. [phone] female here for a Depo Provera for contraception/period management. She is a GYN patient.   OBJECTIVE:  There were no vitals taken for this visit.  Appears well, in no apparent distress  Injection administered in: Left upper quad. gluteus  Meds ordered this encounter  Medications   medroxyPROGESTERone (DEPO-PROVERA) injection 150 mg    ASSESSMENT: GYN patient Depo Provera for contraception/period management PLAN: Follow-up: in 11-13 weeks for next Depo   Malachy Mood  08/31/2022 4:36 PM

## 2022-09-26 ENCOUNTER — Ambulatory Visit: Payer: Medicaid Other | Admitting: Nurse Practitioner

## 2022-09-27 ENCOUNTER — Ambulatory Visit: Payer: Medicaid Other | Admitting: Family Medicine

## 2022-09-28 ENCOUNTER — Encounter: Payer: Self-pay | Admitting: Family Medicine

## 2022-10-03 ENCOUNTER — Encounter: Payer: Self-pay | Admitting: Surgery

## 2022-10-03 ENCOUNTER — Ambulatory Visit: Payer: Medicaid Other | Admitting: Surgery

## 2022-10-03 VITALS — BP 127/89 | HR 75 | Temp 98.4°F | Resp 12 | Ht 62.0 in | Wt 214.0 lb

## 2022-10-03 DIAGNOSIS — L0591 Pilonidal cyst without abscess: Secondary | ICD-10-CM | POA: Diagnosis not present

## 2022-10-03 NOTE — Progress Notes (Signed)
[facility] History and Physical  Reason for Referral: Pilonidal cyst Referring Physician: Folashade Paseda, FNP  Chief Complaint   New Patient (Initial Visit)     Stephanie Cook is a 36 y.o. female.  HPI: Patient presents for evaluation of pilonidal cyst.  It has been present since 2019, and she has required incision and drainage and antibiotics for different times.  The most recent time being in August of this year.  When the area flares, she has been pain associated with it.  She denies any issues since the last time it flared in August.  Her past medical history is significant for hypertension and mood disorder.  She denies any history of surgeries.  She denies use of blood thinning medications.  She smokes 1/2 pack of cigarettes per day and drinks average of 5 beers per day.  She denies use of any illicit drugs.  Past Medical History:  Diagnosis Date   Anxiety    Depression    Frequent urination 09/28/2015   HSV-2 (herpes simplex virus 2) infection    Hx of chlamydia infection    multiple times   Hx of gonorrhea    Hx of trichomoniasis    recurrent, 4 times   Hypertension    Missed period 09/28/2015   Pregnant    Preterm labor    STD (female)    chlamydia, trichomonas, gonorrhea, HPV, HSV    Past Surgical History:  Procedure Laterality Date   NO PAST SURGERIES      Family History  Problem Relation Age of Onset   Lupus Mother    Cirrhosis Mother    ADD / ADHD Mother    Schizophrenia Mother    Heart disease Father        had open heart surgery , not sure of what age and type of heart surgery   Hypertension Father    Hypertension Maternal Grandmother    Colon cancer Neg Hx    Breast cancer Neg Hx     Social History   Tobacco Use   Smoking status: Every Day    Packs/day: 0.25    Years: 8.00    Total pack years: 2.00    Types: Cigarettes   Smokeless tobacco: Never  Vaping Use   Vaping Use: Never used  Substance Use Topics    Alcohol use: Yes    Comment: occasionally   Drug use: Not Currently    Medications: I have reviewed the patient's current medications. Allergies as of 10/03/2022       Reactions   Coconut Fatty Acids    Ibuprofen Hives   Other reaction(s): Hives, Hives   Tramadol Hcl    headache        Medication List        Accurate as of October 03, 2022  1:09 PM. If you have any questions, ask your nurse or doctor.          amLODipine 10 MG tablet Commonly known as: NORVASC Take 1 tablet (10 mg total) by mouth daily.   busPIRone 5 MG tablet Commonly known as: BUSPAR Take 1 tablet (5 mg total) by mouth 3 (three) times daily.   medroxyPROGESTERone 150 MG/ML injection Commonly known as: DEPO-PROVERA INJECT 1ML INTO THE MUSCLE ONCE EVERY 3 MONTHS.   PARoxetine 10 MG tablet Commonly known as: Paxil Take 1 tablet (10 mg total) by mouth daily.         ROS:  Constitutional: negative for chills, fatigue, and fevers Eyes:   negative for visual disturbance and pain Ears, nose, mouth, throat, and face: negative for ear drainage, sore throat, and sinus problems Respiratory: negative for cough, wheezing, and shortness of breath Cardiovascular: negative for chest pain and palpitations Gastrointestinal: positive for reflux symptoms, negative for abdominal pain, nausea, and vomiting Genitourinary:negative for dysuria, frequency, and urinary retention Integument/breast: negative for dryness and rash Hematologic/lymphatic: negative for bleeding and lymphadenopathy Musculoskeletal:positive for back pain, negative for neck pain Neurological: negative for dizziness and tremors Endocrine: negative for temperature intolerance  Blood pressure 127/89, pulse 75, temperature 98.4 F (36.9 C), temperature source Oral, resp. rate 12, height 5\' 2"  (1.575 m), weight 214 lb (97.1 kg), SpO2 98 %. Physical Exam Vitals reviewed.  HENT:     Head: Normocephalic and atraumatic.  Eyes:     Extraocular  Movements: Extraocular movements intact.     Pupils: Pupils are equal, round, and reactive to light.  Cardiovascular:     Rate and Rhythm: Normal rate and regular rhythm.  Pulmonary:     Effort: Pulmonary effort is normal.     Breath sounds: Normal breath sounds.  Abdominal:     General: There is no distension.     Palpations: Abdomen is soft.     Tenderness: There is no abdominal tenderness.  Musculoskeletal:        General: Normal range of motion.     Cervical back: Normal range of motion.  Skin:    General: Skin is warm and dry.     Comments: Left gluteal region with scarring from previous I&D, no current erythema, palpable cyst, or induration; small pit noted midline  Neurological:     General: No focal deficit present.     Mental Status: She is alert and oriented to person, place, and time.  Psychiatric:        Mood and Affect: Mood normal.        Behavior: Behavior normal.     Results: No results found for this or any previous visit (from the past 48 hour(s)).  No results found.   Assessment & Plan:  Stephanie Cook is a 36 y.o. female who presents for evaluation of pilonidal cyst  -We discussed that pilonidal cysts/ sinuses are abnormal tissue under the skin that is prone to infections. It is more common in patients that are overweight, family history of a pilonidal cyst, deep gluteal cleft and thicker body hair, especially in the region of the gluteal cleft. We discussed that the exact cause of the disease is unknown but could be related to ingrown hairs/ and inflammation in the area coupled with mechanical / shear forces. We discussed that these cysts and sinuses can be surgically removed but that they often recur.   -We discussed that surgery has a high risk of recurrence (as high as 50%). -The risk and benefits of pilonidal cyst excision were discussed including but not limited to bleeding, infection, injury to surrounding structures, cyst recurrence, and need for  additional procedures.  After careful consideration, Stephanie Cook has decided to proceed with pilonidal cyst excision.  -Patient tentatively scheduled for surgery on 11/1 -Information provided to the patient regarding pilonidal cyst -Advised her to call if she begins to feel that this area is getting inflamed or infected prior to surgery  All questions were answered to the satisfaction of the patient and family.   13/1, DO [facility] [address] [place], [place] [phone] (office)

## 2022-10-03 NOTE — H&P (Signed)
[facility] History and Physical  Reason for Referral: Pilonidal cyst Referring Physician: Edwin Dada, FNP  Chief Complaint   New Patient (Initial Visit)     Stephanie Cook is a 36 y.o. female.  HPI: Patient presents for evaluation of pilonidal cyst.  It has been present since 2019, and she has required incision and drainage and antibiotics for different times.  The most recent time being in August of this year.  When the area flares, she has been pain associated with it.  She denies any issues since the last time it flared in August.  Her past medical history is significant for hypertension and mood disorder.  She denies any history of surgeries.  She denies use of blood thinning medications.  She smokes 1/2 pack of cigarettes per day and drinks average of 5 beers per day.  She denies use of any illicit drugs.  Past Medical History:  Diagnosis Date   Anxiety    Depression    Frequent urination 09/28/2015   HSV-2 (herpes simplex virus 2) infection    Hx of chlamydia infection    multiple times   Hx of gonorrhea    Hx of trichomoniasis    recurrent, 4 times   Hypertension    Missed period 09/28/2015   Pregnant    Preterm labor    STD (female)    chlamydia, trichomonas, gonorrhea, HPV, HSV    Past Surgical History:  Procedure Laterality Date   NO PAST SURGERIES      Family History  Problem Relation Age of Onset   Lupus Mother    Cirrhosis Mother    ADD / ADHD Mother    Schizophrenia Mother    Heart disease Father        had open heart surgery , not sure of what age and type of heart surgery   Hypertension Father    Hypertension Maternal Grandmother    Colon cancer Neg Hx    Breast cancer Neg Hx     Social History   Tobacco Use   Smoking status: Every Day    Packs/day: 0.25    Years: 8.00    Total pack years: 2.00    Types: Cigarettes   Smokeless tobacco: Never  Vaping Use   Vaping Use: Never used  Substance Use Topics    Alcohol use: Yes    Comment: occasionally   Drug use: Not Currently    Medications: I have reviewed the patient's current medications. Allergies as of 10/03/2022       Reactions   Coconut Fatty Acids    Ibuprofen Hives   Other reaction(s): Hives, Hives   Tramadol Hcl    headache        Medication List        Accurate as of October 03, 2022  1:09 PM. If you have any questions, ask your nurse or doctor.          amLODipine 10 MG tablet Commonly known as: NORVASC Take 1 tablet (10 mg total) by mouth daily.   busPIRone 5 MG tablet Commonly known as: BUSPAR Take 1 tablet (5 mg total) by mouth 3 (three) times daily.   medroxyPROGESTERone 150 MG/ML injection Commonly known as: DEPO-PROVERA INJECT INTO THE MUSCLE ONCE EVERY 3 MONTHS.   PARoxetine 10 MG tablet Commonly known as: Paxil Take 1 tablet (10 mg total) by mouth daily.         ROS:  Constitutional: negative for chills, fatigue, and fevers Eyes:  negative for visual disturbance and pain Ears, nose, mouth, throat, and face: negative for ear drainage, sore throat, and sinus problems Respiratory: negative for cough, wheezing, and shortness of breath Cardiovascular: negative for chest pain and palpitations Gastrointestinal: positive for reflux symptoms, negative for abdominal pain, nausea, and vomiting Genitourinary:negative for dysuria, frequency, and urinary retention Integument/breast: negative for dryness and rash Hematologic/lymphatic: negative for bleeding and lymphadenopathy Musculoskeletal:positive for back pain, negative for neck pain Neurological: negative for dizziness and tremors Endocrine: negative for temperature intolerance  Blood pressure 127/89, pulse 75, temperature 98.4 F (36.9 C), temperature source Oral, resp. rate 12, height 5\' 2"  (1.575 m), weight 214 lb (97.1 kg), SpO2 98 %. Physical Exam Vitals reviewed.  HENT:     Head: Normocephalic and atraumatic.  Eyes:     Extraocular  Movements: Extraocular movements intact.     Pupils: Pupils are equal, round, and reactive to light.  Cardiovascular:     Rate and Rhythm: Normal rate and regular rhythm.  Pulmonary:     Effort: Pulmonary effort is normal.     Breath sounds: Normal breath sounds.  Abdominal:     General: There is no distension.     Palpations: Abdomen is soft.     Tenderness: There is no abdominal tenderness.  Musculoskeletal:        General: Normal range of motion.     Cervical back: Normal range of motion.  Skin:    General: Skin is warm and dry.     Comments: Left gluteal region with scarring from previous I&D, no current erythema, palpable cyst, or induration; small pit noted midline  Neurological:     General: No focal deficit present.     Mental Status: She is alert and oriented to person, place, and time.  Psychiatric:        Mood and Affect: Mood normal.        Behavior: Behavior normal.     Results: No results found for this or any previous visit (from the past 48 hour(s)).  No results found.   Assessment & Plan:  Stephanie Cook is a 36 y.o. female who presents for evaluation of pilonidal cyst  -We discussed that pilonidal cysts/ sinuses are abnormal tissue under the skin that is prone to infections. It is more common in patients that are overweight, family history of a pilonidal cyst, deep gluteal cleft and thicker body hair, especially in the region of the gluteal cleft. We discussed that the exact cause of the disease is unknown but could be related to ingrown hairs/ and inflammation in the area coupled with mechanical / shear forces. We discussed that these cysts and sinuses can be surgically removed but that they often recur.   -We discussed that surgery has a high risk of recurrence (as high as 50%). -The risk and benefits of pilonidal cyst excision were discussed including but not limited to bleeding, infection, injury to surrounding structures, cyst recurrence, and need for  additional procedures.  After careful consideration, Stephanie Cook has decided to proceed with pilonidal cyst excision.  -Patient tentatively scheduled for surgery on 11/1 -Information provided to the patient regarding pilonidal cyst -Advised her to call if she begins to feel that this area is getting inflamed or infected prior to surgery  All questions were answered to the satisfaction of the patient and family.   Graciella Freer, DO [facility] [address] [place], [place] [address] [phone] (office)

## 2022-10-16 ENCOUNTER — Encounter (HOSPITAL_COMMUNITY): Payer: Medicaid Other

## 2022-10-19 ENCOUNTER — Ambulatory Visit: Payer: Medicaid Other

## 2022-10-23 ENCOUNTER — Ambulatory Visit: Payer: Medicaid Other | Admitting: Family Medicine

## 2022-10-23 ENCOUNTER — Ambulatory Visit: Payer: Medicaid Other | Admitting: Nurse Practitioner

## 2022-10-26 ENCOUNTER — Other Ambulatory Visit: Payer: Self-pay | Admitting: *Deleted

## 2022-10-26 ENCOUNTER — Ambulatory Visit
Admission: EM | Admit: 2022-10-26 | Discharge: 2022-10-26 | Disposition: A | Discharge status: Home / Self Care | Payer: Medicaid Other | Attending: Vascular Surgery | Admitting: Vascular Surgery

## 2022-10-26 DIAGNOSIS — R058 Other specified cough: Secondary | ICD-10-CM | POA: Insufficient documentation

## 2022-10-26 DIAGNOSIS — J069 Acute upper respiratory infection, unspecified: Secondary | ICD-10-CM | POA: Insufficient documentation

## 2022-10-26 DIAGNOSIS — J029 Acute pharyngitis, unspecified: Secondary | ICD-10-CM | POA: Diagnosis not present

## 2022-10-26 DIAGNOSIS — Z1152 Encounter for screening for COVID-19: Secondary | ICD-10-CM | POA: Insufficient documentation

## 2022-10-26 LAB — POCT RAPID STREP A (OFFICE): Rapid Strep A Screen: NEGATIVE

## 2022-10-26 MED ORDER — PROMETHAZINE-DM 6.25-15 MG/5ML PO SYRP: 5.0000 mL | ORAL_SOLUTION | Freq: Four times a day (QID) | ORAL | 0 refills | Status: DC | PRN

## 2022-10-26 MED ORDER — PAROXETINE HCL 10 MG PO TABS: 10.0000 mg | ORAL_TABLET | Freq: Every day | ORAL | 3 refills | Status: DC

## 2022-10-26 MED ORDER — FLUTICASONE PROPIONATE 50 MCG/ACT NA SUSP: 1.0000 | Freq: Two times a day (BID) | NASAL | 2 refills | Status: DC

## 2022-10-26 NOTE — ED Provider Notes (Signed)
[facility]    CSN: [id number] Arrival date & time: 10/26/22  0909      History   Chief Complaint Chief Complaint  Patient presents with   Sore Throat    HPI Stephanie Cook is a 36 y.o. female.   Presenting today with 3-day history sore throat, congestion, cough, fever, chills, body aches.  Denies chest pain, shortness of breath, abdominal pain, nausea vomiting or diarrhea.  So far trying Mucinex and Tylenol with minimal relief.  Multiple sick contacts recently.  No known history of chronic pulmonary disease.    Past Medical History:  Diagnosis Date   Anxiety    Depression    Frequent urination 09/28/2015   HSV-2 (herpes simplex virus 2) infection    Hx of chlamydia infection    multiple times   Hx of gonorrhea    Hx of trichomoniasis    recurrent, 4 times   Hypertension    Missed period 09/28/2015   Pregnant    Preterm labor    STD (female)    chlamydia, trichomonas, gonorrhea, HPV, HSV    Patient Active Problem List   Diagnosis Date Noted   Anxiety and depression 07/17/2022   Acute pain of left knee 06/21/2022   Encounter for surveillance of injectable contraceptive 05/22/2022   Anxiety 05/22/2022   Smoker 05/22/2022   Encounter for well woman exam with routine gynecological exam 05/22/2022   BV (bacterial vaginosis) 12/16/2020   Irregular bleeding 12/16/2020   Pregnancy test negative 12/16/2020   Screening cholesterol level 05/11/2020   Urine pregnancy test negative 05/11/2020   Hypertension 05/11/2020   Menorrhagia with regular cycle 05/11/2020   Dysmenorrhea 05/11/2020   Encounter for initial prescription of injectable contraceptive 05/11/2020   Preterm labor 01/24/2019   Supervision of high risk pregnancy, antepartum 08/08/2018   Essential hypertension, benign 08/01/2016   Obesity 08/01/2016   Cigarette nicotine dependence without complication 08/01/2016   Major depressive disorder, single episode, severe, specified as with  psychotic behavior 03/12/2014   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 Authorizing Provider  acetaminophen (TYLENOL) 500 MG tablet Take 500 mg by mouth every 6 (six) hours as needed.   Yes [provider]  dextromethorphan-guaiFENesin (MUCINEX DM) 30-600 MG 12hr tablet Take 1 tablet by mouth 2 (two) times daily.   Yes [provider]  fluticasone (FLONASE) 50 MCG/ACT nasal spray Place 1 spray into both nostrils 2 (two) times daily. 10/26/22  Yes Particia Nearing, PA-C  promethazine-dextromethorphan (PROMETHAZINE-DM) 6.25-15 MG/5ML syrup Take 5 mLs by mouth 4 (four) times daily as needed. 10/26/22  Yes Particia Nearing, PA-C  amLODipine (NORVASC) 10 MG tablet  Take 1 tablet (10 mg total) by mouth daily. 05/22/22   Adline Potter, NP  busPIRone (BUSPAR) 5 MG tablet Take 1 tablet (5 mg total) by mouth 3 (three) times daily. 08/28/22   Adline Potter, NP  medroxyPROGESTERone (DEPO-PROVERA) 150 MG/ML injection INJECT INTO THE MUSCLE ONCE EVERY 3 MONTHS. 05/22/22   Adline Potter, NP  PARoxetine (PAXIL) 10 MG tablet Take 1 tablet (10 mg total) by mouth daily. 08/28/22   Adline Potter, NP    Family History Family History  Problem Relation Age of Onset   Lupus Mother    Cirrhosis Mother    ADD / ADHD Mother    Schizophrenia Mother    Heart  disease Father        had open heart surgery , not sure of what age and type of heart surgery   Hypertension Father    Hypertension Maternal Grandmother    Colon cancer Neg Hx    Breast cancer Neg Hx     Social History Social History   Tobacco Use   Smoking status: Every Day    Packs/day: 0.25    Years: 8.00    Total pack years: 2.00    Types: Cigarettes   Smokeless tobacco: Never  Vaping Use   Vaping Use: Never used  Substance Use Topics   Alcohol use: Yes    Comment: occasionally   Drug use: Not Currently     Allergies   Coconut fatty acids, Ibuprofen, and Tramadol hcl   Review of Systems Review of Systems HPI  Physical Exam Triage Vital Signs ED Triage Vitals  Enc Vitals Group     BP 10/26/22 0956 (!) 129/91     Pulse Rate 10/26/22 0956 75     Resp 10/26/22 0956 17     Temp 10/26/22 0956 98.9 F (37.2 C)     Temp Source 10/26/22 0956 Oral     SpO2 10/26/22 0956 98 %     Weight --      Height --      Head Circumference --      Peak Flow --      Pain Score 10/26/22 0954 10     Pain Loc --      Pain Edu? --      Excl. in GC? --    No data found.  Updated Vital Signs BP (!) 129/91 (BP Location: Right Arm)   Pulse 75   Temp 98.9 F (37.2 C) (Oral)   Resp 17   SpO2 98%   Visual Acuity Right Eye Distance:   Left Eye Distance:   Bilateral Distance:    Right Eye Near:   Left Eye Near:    Bilateral Near:     Physical Exam Vitals and nursing note reviewed.  Constitutional:      Appearance: Normal appearance.  HENT:     Head: Atraumatic.     Right Ear: Tympanic membrane and external ear normal.     Left Ear: Tympanic membrane and external ear normal.     Nose: Congestion present.     Mouth/Throat:     Mouth: Mucous membranes are moist.     Pharynx: Posterior oropharyngeal erythema present.  Eyes:     Extraocular Movements: Extraocular movements intact.     Conjunctiva/sclera: Conjunctivae normal.  Cardiovascular:     Rate and Rhythm:  Normal rate and regular rhythm.     Heart sounds: Normal heart sounds.  Pulmonary:  Effort: Pulmonary effort is normal.     Breath sounds: Normal breath sounds. No wheezing or rales.  Musculoskeletal:        General: Normal range of motion.     Cervical back: Normal range of motion and neck supple.  Skin:    General: Skin is warm and dry.  Neurological:     Mental Status: She is alert and oriented to person, place, and time.     Motor: No weakness.     Gait: Gait normal.  Psychiatric:        Mood and Affect: Mood normal.        Thought Content: Thought content normal.      UC Treatments / Results  Labs (all labs ordered are listed, but only abnormal results are displayed) Labs Reviewed  RESP PANEL BY RT-PCR (FLU A&B, COVID) ARPGX2  POCT RAPID STREP A (OFFICE)    EKG   Radiology No results found.  Procedures Procedures (including critical care time)  Medications Ordered in UC Medications - No data to display  Initial Impression / Assessment and Plan / UC Course  I have reviewed the triage vital signs and the nursing notes.  Pertinent labs & imaging results that were available during my care of the patient were reviewed by me and considered in my medical decision making (see chart for details).     Vital signs and exam overall reassuring today and suggestive of a viral upper respiratory infection.  Treat with Phenergan DM, Flonase, over-the-counter cold and congestion medications and supportive home care.  Respiratory panel pending, rapid strep negative.  Return for worsening symptoms.  Work note given.  Final Clinical Impressions(s) / UC Diagnoses   Final diagnoses:  Viral URI with cough   Discharge Instructions   None    ED Prescriptions     Medication Sig Dispense Auth. Provider   promethazine-dextromethorphan (PROMETHAZINE-DM) 6.25-15 MG/5ML syrup Take 5 mLs by mouth 4 (four) times daily as needed. 100 mL Particia Nearing, PA-C   fluticasone  [facility]) 50 MCG/ACT nasal spray Place 1 spray into both nostrils 2 (two) times daily. 16 g Particia Nearing, [place]      PDMP not reviewed this encounter.   Particia Nearing, [place] 10/26/22 [id number]

## 2022-10-26 NOTE — ED Triage Notes (Signed)
Pt reports sore throat and congestion x 3 days. Mucinex and Tylenol gives no relief.

## 2022-10-27 LAB — RESP PANEL BY RT-PCR (FLU A&B, COVID) ARPGX2
Influenza A by PCR: NEGATIVE
Influenza B by PCR: NEGATIVE
SARS Coronavirus 2 by RT PCR: NEGATIVE

## 2022-11-08 ENCOUNTER — Encounter (HOSPITAL_COMMUNITY): Payer: Self-pay

## 2022-11-08 ENCOUNTER — Other Ambulatory Visit: Payer: Self-pay

## 2022-11-08 ENCOUNTER — Emergency Department (HOSPITAL_COMMUNITY)
Admission: EM | Admit: 2022-11-08 | Discharge: 2022-11-08 | Disposition: A | Discharge status: Home / Self Care | Payer: Medicaid Other | Attending: Emergency Medicine | Admitting: Emergency Medicine

## 2022-11-08 DIAGNOSIS — J020 Streptococcal pharyngitis: Secondary | ICD-10-CM | POA: Diagnosis not present

## 2022-11-08 DIAGNOSIS — Z20822 Contact with and (suspected) exposure to covid-19: Secondary | ICD-10-CM | POA: Insufficient documentation

## 2022-11-08 DIAGNOSIS — J029 Acute pharyngitis, unspecified: Secondary | ICD-10-CM | POA: Diagnosis present

## 2022-11-08 LAB — SARS CORONAVIRUS 2 BY RT PCR: SARS Coronavirus 2 by RT PCR: NEGATIVE

## 2022-11-08 LAB — GROUP A STREP BY PCR: Group A Strep by PCR: DETECTED — AB

## 2022-11-08 MED ORDER — MAGIC MOUTHWASH W/LIDOCAINE: 10.0000 mL | Freq: Four times a day (QID) | ORAL | 0 refills | Status: DC | PRN

## 2022-11-08 MED ORDER — PENICILLIN G BENZATHINE 1200000 UNIT/2ML IM SUSY
1.2000 10*6.[IU] | PREFILLED_SYRINGE | Freq: Once | INTRAMUSCULAR | Status: AC
  Administered 2022-11-08: 1.2 10*6.[IU] via INTRAMUSCULAR
  Filled 2022-11-08: qty 2

## 2022-11-08 NOTE — ED Triage Notes (Signed)
Patient cam in from home with a "real bad sore throat". Started 3 days ago and getting progressively worse. Swabbed for Covid.

## 2022-11-08 NOTE — ED Provider Notes (Signed)
[facility] EMERGENCY DEPARTMENT Provider Note   CSN: [id number] Arrival date & time: 11/08/22  3559     History  Chief Complaint  Patient presents with   Sore Throat    States has a bad sore throat started 3 days ago    Stephanie Cook is a 36 y.o. female.  The history is provided by the patient.  Sore Throat This is a new problem. The current episode started more than 2 days ago (Symptoms started 3 days ago.  Pt states her wife was diagnosed with strep throat this past week.). The problem occurs constantly. The problem has not changed since onset.Pertinent negatives include no chest pain, no abdominal pain and no shortness of breath. The symptoms are aggravated by swallowing. Nothing relieves the symptoms. She has tried acetaminophen for the symptoms. The treatment provided no relief.       Home Medications Prior to Admission medications   Medication Sig Start Date End Date Taking? Authorizing Provider  magic mouthwash w/lidocaine SOLN Take 10 mLs by mouth 4 (four) times daily as needed (throat pain). 11/08/22  Yes Rosaisela Jamroz, Raynelle Fanning, PA-C  acetaminophen (TYLENOL) 500 MG tablet Take 500 mg by mouth every 6 (six) hours as needed for moderate pain.    [provider]  amLODipine (NORVASC) 10 MG tablet Take 1 tablet (10 mg total) by mouth daily. 05/22/22   Adline Potter, NP  busPIRone (BUSPAR) 5 MG tablet Take 1 tablet (5 mg total) by mouth 3 (three) times daily. 08/28/22   Adline Potter, NP  dextromethorphan-guaiFENesin (MUCINEX DM) 30-600 MG 12hr tablet Take 1 tablet by mouth daily as needed for cough.    [provider]  fluticasone (FLONASE) 50 MCG/ACT nasal spray Place 1 spray into both nostrils 2 (two) times daily. Patient not taking: Reported on 11/07/2022 10/26/22   Particia Nearing, PA-C  medroxyPROGESTERone (DEPO-PROVERA) 150 MG/ML injection INJECT INTO THE MUSCLE ONCE EVERY 3 MONTHS. 05/22/22   Adline Potter, NP  PARoxetine (PAXIL)  10 MG tablet Take 1 tablet (10 mg total) by mouth daily. 10/26/22   Adline Potter, NP  promethazine-dextromethorphan (PROMETHAZINE-DM) 6.25-15 MG/5ML syrup Take 5 mLs by mouth 4 (four) times daily as needed. Patient not taking: Reported on 11/07/2022 10/26/22   Particia Nearing, PA-C      Allergies    Coconut fatty acids, Ibuprofen, and Tramadol hcl    Review of Systems   Review of Systems  Constitutional:  Positive for chills. Negative for fever.  HENT:  Positive for sore throat. Negative for congestion, ear pain, rhinorrhea, sinus pressure, trouble swallowing and voice change.   Eyes:  Negative for discharge.  Respiratory:  Negative for cough, shortness of breath, wheezing and stridor.   Cardiovascular:  Negative for chest pain.  Gastrointestinal:  Negative for abdominal pain, nausea and vomiting.  Genitourinary: Negative.   All other systems reviewed and are negative.   Physical Exam Updated Vital Signs BP (!) 133/100 (BP Location: Left Arm)   Pulse 82   Temp 99.6 F (37.6 C) (Oral)   Resp 20   Ht 5\' 2"  (1.575 m)   Wt 99.8 kg   SpO2 99%   BMI 40.24 kg/m  Physical Exam Constitutional:      Appearance: She is well-developed.  HENT:     Head: Normocephalic and atraumatic.     Nose: Mucosal edema present. No rhinorrhea.     Mouth/Throat:     Mouth: Mucous membranes are moist.  Pharynx: Uvula midline. Posterior oropharyngeal erythema present. No oropharyngeal exudate.     Tonsils: Tonsillar exudate present. No tonsillar abscesses. 1+ on the right. 1+ on the left.  Eyes:     Conjunctiva/sclera: Conjunctivae normal.  Cardiovascular:     Rate and Rhythm: Normal rate.     Heart sounds: Normal heart sounds.  Pulmonary:     Effort: Pulmonary effort is normal. No respiratory distress.     Breath sounds: No wheezing or rales.  Abdominal:     Palpations: Abdomen is soft.     Tenderness: There is no abdominal tenderness.  Musculoskeletal:        General: Normal  range of motion.  Lymphadenopathy:     Head:     Right side of head: Tonsillar adenopathy present.     Left side of head: Tonsillar adenopathy present.  Skin:    General: Skin is warm and dry.     Findings: No rash.  Neurological:     Mental Status: She is alert and oriented to person, place, and time.     ED Results / Procedures / Treatments   Labs (all labs ordered are listed, but only abnormal results are displayed) Labs Reviewed  GROUP A STREP BY PCR  SARS CORONAVIRUS 2 BY RT PCR    EKG None  Radiology No results found.  Procedures Procedures    Medications Ordered in ED Medications  penicillin g benzathine (BICILLIN LA) 1200000 UNIT/2ML injection 1.2 Million Units (1.2 Million Units Intramuscular Given 11/08/22 0953)    ED Course/ Medical Decision Making/ A&P                           Medical Decision Making Patient's exam and history is consistent with strep pharyngitis, especially in the setting of close contact with strep throat.  No other significant URI type symptoms suggesting a viral process.  Patient treated empirically for strep pharyngitis.  She preferred injection over oral medications.  She was also prescribed Magic mouthwash for symptom relief, advised Tylenol additionally as needed for symptom relief.  Risk Prescription drug management.           Final Clinical Impression(s) / ED Diagnoses Final diagnoses:  Strep pharyngitis    Rx / DC Orders ED Discharge Orders          Ordered    magic mouthwash w/lidocaine SOLN  4 times daily PRN        11/08/22 1000              Evalee Jefferson, PA-C 11/08/22 1002    Milton Ferguson, MD 11/11/22 1012

## 2022-11-08 NOTE — Discharge Instructions (Signed)
You have been treated for strep throat today with the injection you received.  Rest and make sure you are drinking plenty of fluids.  You have been prescribed medication that may help with your sore throat pain.  I also recommend Tylenol as needed for fever and pain as well.

## 2022-11-08 NOTE — ED Notes (Signed)
Patient left ambulatory DC instructions given by teach back and script for sore throat pain.

## 2022-11-14 NOTE — Patient Instructions (Signed)
Stephanie Cook  11/14/2022     @PREFPERIOPPHARMACY @   Your procedure is scheduled on 11/17/2022.   Report to [facility] at  0600  A.M.   Call this number if you have problems the morning of surgery:  [phone]  If you experience any cold or flu symptoms such as cough, fever, chills, shortness of breath, etc. between now and your scheduled surgery, please notify [phone] at the above number.   Remember:  Do not eat or drink after midnight.      Take these medicines the morning of surgery with A SIP OF WATER                             amlodipine, buspar, paxil.     Do not wear jewelry, make-up or nail polish.  Do not wear lotions, powders, or perfumes, or deodorant.  Do not shave 48 hours prior to surgery.  Men may shave face and neck.  Do not bring valuables to the hospital.  [facility] is not responsible for any belongings or valuables.  Contacts, dentures or bridgework may not be worn into surgery.  Leave your suitcase in the car.  After surgery it may be brought to your room.  For patients admitted to the hospital, discharge time will be determined by your treatment team.  Patients discharged the day of surgery will not be allowed to drive home and must have someone with them for 24 hours.    Special instructions:   DO NOT smoke tobacco or vape for 24 hours before your procedure.  Please read over the following fact sheets that you were given. Coughing and Deep Breathing, Surgical Site Infection Prevention, Anesthesia Post-op Instructions, and Care and Recovery After Surgery        Pilonidal Cyst Drainage, Care After The following information offers guidance on how to care for yourself after your procedure. Your health care provider may also give you more specific instructions. If you have problems or questions, contact your health care provider. What can I expect after the procedure? After the procedure, it is common to have: Pain that gets better  when you take medicine. Some fluid or blood coming from your wound. Follow these instructions at home: Medicines Take over-the-counter and prescription medicines only as told by your health care provider. If you were prescribed antibiotics, take them as told by your health care provider. Do not stop using the antibiotic even if you start to feel better. Ask your health care provider if the medicine prescribed to you: Requires you to avoid driving or using machinery. Can cause constipation. You may need to take these actions to prevent or treat constipation: Drink enough fluid to keep your urine pale yellow. Take over-the-counter or prescription medicines. Eat foods that are high in fiber, such as beans, whole grains, and fresh fruits and vegetables. Limit foods that are high in fat and processed sugars, such as fried or sweet foods. Bathing Do not take baths or showers, swim, or use a hot tub until your health care provider approves. You may only be allowed to take sponge baths. When you can return to bathing will depend on the type of wound that you have. If your wound was packed with a germ-free packing material, keep the area dry until your packing has been removed. After the packing has been removed, you may start taking showers when your health care provider approves.  If the edges of the incision around your wound were stitched to your skin (marsupialization), you may start taking showers the day after surgery, or when your health care provider approves. Let the water from the shower moisten your bandage (dressing). This will make it easier to take off. Remove your dressing before you shower. While bathing, clean your buttocks area gently with soap and water. After bathing: Pat the area dry with a soft, clean towel. If directed, cover the area with a clean dressing. Wound care  You may need to have a caregiver help you with wound care and dressing changes. Follow instructions from your  health care provider about how to take care of your wound. Make sure you: Wash your hands with soap and water for at least 20 seconds before and after you change your dressing. If soap and water are not available, use hand sanitizer. Change your dressing as told by your health care provider. Leave stitches (sutures), skin glue, or adhesive strips in place. These skin closures may need to stay in place for 2 weeks or longer. If adhesive strip edges start to loosen and curl up, you may trim the loose edges. Do not remove adhesive strips completely unless your health care provider tells you to do that. Check your wound every day for signs of infection. Check for: Redness, swelling, or more pain. More fluid or blood. Warmth. Pus or a bad smell. Follow any additional instructions from your health care provider on how to care for your wound, such as wound cleaning, wound flushing (irrigation), or packing your wound with a dressing. If you had marsupialization, ask your health care provider when you can stop using a dressing. Lifestyle Do not do activities that irritate or put pressure on your buttocks for about 2 weeks, or as long as told by your health care provider. These activities include bike riding, running, and anything that involves a twisting motion. Rest as told by your health care provider. Do not sit for a long time without moving. Get up to take short walks every 1-2 hours. This will improve blood flow and breathing. Ask for help if you feel weak or unsteady. Sleep on your side instead of your back. Avoid wearing tight underwear and tight pants. Return to your normal activities as told by your health care provider. Ask your health care provider what activities are safe for you. General instructions Do not use any products that contain nicotine or tobacco. These products include cigarettes, chewing tobacco, and vaping devices, such as e-cigarettes. These can delay wound healing after  surgery. If you need help quitting, ask your health care provider. Keep all follow-up visits. This is important to monitor healing. If you had an incision and drainage procedure with wound packing, your packing may be changed or removed at follow-up visits. Contact a health care provider if: You have pain that does not get better with medicine. You have any of these signs of infection: More redness, swelling, or pain around your incision. More fluid or blood coming from your incision. Warmth coming from your incision. Pus or a bad smell coming from your incision. A fever. You have muscle aches. You feel generally sick. You are dizzy. Summary A pilonidal cyst is a fluid-filled sac that forms under the skin near the tailbone. It is common to have some fluid or blood coming from your wound after a procedure to drain a pilonidal cyst. If you were prescribed antibiotics, take them as told by your health care  provider. Do not stop taking the antibiotic even if you start to feel better. You may need to have a caregiver help you with wound care and dressing changes. Return to your health care provider as instructed to have any packing material changed or removed. This information is not intended to replace advice given to you by your health care provider. Make sure you discuss any questions you have with your health care provider. Document Revised: 03/01/2022 Document Reviewed: 03/01/2022 Elsevier Patient Education  2023 [employer] General Anesthesia, Adult, Care After The following information offers guidance on how to care for yourself after your procedure. Your health care provider may also give you more specific instructions. If you have problems or questions, contact your health care provider. What can I expect after the procedure? After the procedure, it is common for people to: Have pain or discomfort at the IV site. Have nausea or vomiting. Have a sore throat or hoarseness. Have  trouble concentrating. Feel cold or chills. Feel weak, sleepy, or tired (fatigue). Have soreness and body aches. These can affect parts of the body that were not involved in surgery. Follow these instructions at home: For the time period you were told by your health care provider:  Rest. Do not participate in activities where you could fall or become injured. Do not drive or use machinery. Do not drink alcohol. Do not take sleeping pills or medicines that cause drowsiness. Do not make important decisions or sign legal documents. Do not take care of children on your own. General instructions Drink enough fluid to keep your urine pale yellow. If you have sleep apnea, surgery and certain medicines can increase your risk for breathing problems. Follow instructions from your health care provider about wearing your sleep device: Anytime you are sleeping, including during daytime naps. While taking prescription pain medicines, sleeping medicines, or medicines that make you drowsy. Return to your normal activities as told by your health care provider. Ask your health care provider what activities are safe for you. Take over-the-counter and prescription medicines only as told by your health care provider. Do not use any products that contain nicotine or tobacco. These products include cigarettes, chewing tobacco, and vaping devices, such as e-cigarettes. These can delay incision healing after surgery. If you need help quitting, ask your health care provider. Contact a health care provider if: You have nausea or vomiting that does not get better with medicine. You vomit every time you eat or drink. You have pain that does not get better with medicine. You cannot urinate or have bloody urine. You develop a skin rash. You have a fever. Get help right away if: You have trouble breathing. You have chest pain. You vomit blood. These symptoms may be an emergency. Get help right away. Call 911. Do  not wait to see if the symptoms will go away. Do not drive yourself to the hospital. Summary After the procedure, it is common to have a sore throat, hoarseness, nausea, vomiting, or to feel weak, sleepy, or fatigue. For the time period you were told by your health care provider, do not drive or use machinery. Get help right away if you have difficulty breathing, have chest pain, or vomit blood. These symptoms may be an emergency. This information is not intended to replace advice given to you by your health care provider. Make sure you discuss any questions you have with your health care provider. Document Revised: 03/03/2022 Document Reviewed: 03/03/2022 Elsevier Patient Education  2023 [employer]  How to Use Chlorhexidine Before Surgery Chlorhexidine gluconate (CHG) is a germ-killing (antiseptic) solution that is used to clean the skin. It can get rid of the bacteria that normally live on the skin and can keep them away for about 24 hours. To clean your skin with CHG, you may be given: A CHG solution to use in the shower or as part of a sponge bath. A prepackaged cloth that contains CHG. Cleaning your skin with CHG may help lower the risk for infection: While you are staying in the intensive care unit of the hospital. If you have a vascular access, such as a central line, to provide short-term or long-term access to your veins. If you have a catheter to drain urine from your bladder. If you are on a ventilator. A ventilator is a machine that helps you breathe by moving air in and out of your lungs. After surgery. What are the risks? Risks of using CHG include: A skin reaction. Hearing loss, if CHG gets in your ears and you have a perforated eardrum. Eye injury, if CHG gets in your eyes and is not rinsed out. The CHG product catching fire. Make sure that you avoid smoking and flames after applying CHG to your skin. Do not use CHG: If you have a chlorhexidine allergy or have previously  reacted to chlorhexidine. On babies younger than 75 months of age. How to use CHG solution Use CHG only as told by your health care provider, and follow the instructions on the label. Use the full amount of CHG as directed. Usually, this is one bottle. During a shower Follow these steps when using CHG solution during a shower (unless your health care provider gives you different instructions): Start the shower. Use your normal soap and shampoo to wash your face and hair. Turn off the shower or move out of the shower stream. Pour the CHG onto a clean washcloth. Do not use any type of brush or rough-edged sponge. Starting at your neck, lather your body down to your toes. Make sure you follow these instructions: If you will be having surgery, pay special attention to the part of your body where you will be having surgery. Scrub this area for at least 1 minute. Do not use CHG on your head or face. If the solution gets into your ears or eyes, rinse them well with water. Avoid your genital area. Avoid any areas of skin that have broken skin, cuts, or scrapes. Scrub your back and under your arms. Make sure to wash skin folds. Let the lather sit on your skin for 1-2 minutes or as long as told by your health care provider. Thoroughly rinse your entire body in the shower. Make sure that all body creases and crevices are rinsed well. Dry off with a clean towel. Do not put any substances on your body afterward--such as powder, lotion, or perfume--unless you are told to do so by your health care provider. Only use lotions that are recommended by the manufacturer. Put on clean clothes or pajamas. If it is the night before your surgery, sleep in clean sheets.  During a sponge bath Follow these steps when using CHG solution during a sponge bath (unless your health care provider gives you different instructions): Use your normal soap and shampoo to wash your face and hair. Pour the CHG onto a clean  washcloth. Starting at your neck, lather your body down to your toes. Make sure you follow these instructions: If you will be having  surgery, pay special attention to the part of your body where you will be having surgery. Scrub this area for at least 1 minute. Do not use CHG on your head or face. If the solution gets into your ears or eyes, rinse them well with water. Avoid your genital area. Avoid any areas of skin that have broken skin, cuts, or scrapes. Scrub your back and under your arms. Make sure to wash skin folds. Let the lather sit on your skin for 1-2 minutes or as long as told by your health care provider. Using a different clean, wet washcloth, thoroughly rinse your entire body. Make sure that all body creases and crevices are rinsed well. Dry off with a clean towel. Do not put any substances on your body afterward--such as powder, lotion, or perfume--unless you are told to do so by your health care provider. Only use lotions that are recommended by the manufacturer. Put on clean clothes or pajamas. If it is the night before your surgery, sleep in clean sheets. How to use CHG prepackaged cloths Only use CHG cloths as told by your health care provider, and follow the instructions on the label. Use the CHG cloth on clean, dry skin. Do not use the CHG cloth on your head or face unless your health care provider tells you to. When washing with the CHG cloth: Avoid your genital area. Avoid any areas of skin that have broken skin, cuts, or scrapes. Before surgery Follow these steps when using a CHG cloth to clean before surgery (unless your health care provider gives you different instructions): Using the CHG cloth, vigorously scrub the part of your body where you will be having surgery. Scrub using a back-and-forth motion for 3 minutes. The area on your body should be completely wet with CHG when you are done scrubbing. Do not rinse. Discard the cloth and let the area air-dry. Do not put  any substances on the area afterward, such as powder, lotion, or perfume. Put on clean clothes or pajamas. If it is the night before your surgery, sleep in clean sheets.  For general bathing Follow these steps when using CHG cloths for general bathing (unless your health care provider gives you different instructions). Use a separate CHG cloth for each area of your body. Make sure you wash between any folds of skin and between your fingers and toes. Wash your body in the following order, switching to a new cloth after each step: The front of your neck, shoulders, and chest. Both of your arms, under your arms, and your hands. Your stomach and groin area, avoiding the genitals. Your right leg and foot. Your left leg and foot. The back of your neck, your back, and your buttocks. Do not rinse. Discard the cloth and let the area air-dry. Do not put any substances on your body afterward--such as powder, lotion, or perfume--unless you are told to do so by your health care provider. Only use lotions that are recommended by the manufacturer. Put on clean clothes or pajamas. Contact a health care provider if: Your skin gets irritated after scrubbing. You have questions about using your solution or cloth. You swallow any chlorhexidine. Call your local poison control center ([phone] in the [place] Get help right away if: Your eyes itch badly, or they become very red or swollen. Your skin itches badly and is red or swollen. Your hearing changes. You have trouble seeing. You have swelling or tingling in your mouth or throat. You have trouble breathing.  These symptoms may represent a serious problem that is an emergency. Do not wait to see if the symptoms will go away. Get medical help right away. Call your local emergency services (911 in the [place] Do not drive yourself to the hospital. Summary Chlorhexidine gluconate (CHG) is a germ-killing (antiseptic) solution that is used to clean the skin.  Cleaning your skin with CHG may help to lower your risk for infection. You may be given CHG to use for bathing. It may be in a bottle or in a prepackaged cloth to use on your skin. Carefully follow your health care provider's instructions and the instructions on the product label. Do not use CHG if you have a chlorhexidine allergy. Contact your health care provider if your skin gets irritated after scrubbing. This information is not intended to replace advice given to you by your health care provider. Make sure you discuss any questions you have with your health care provider. Document Revised: 04/03/2022 Document Reviewed: 02/14/2021 Elsevier Patient Education  2023 [employer].

## 2022-11-15 ENCOUNTER — Encounter (HOSPITAL_COMMUNITY)
Admission: RE | Admit: 2022-11-15 | Discharge: 2022-11-15 | Disposition: A | Discharge status: Home / Self Care | Payer: Medicaid Other | Source: Ambulatory Visit | Attending: Surgery | Admitting: Surgery

## 2022-11-15 ENCOUNTER — Encounter (HOSPITAL_COMMUNITY): Payer: Self-pay

## 2022-11-15 DIAGNOSIS — Z01818 Encounter for other preprocedural examination: Secondary | ICD-10-CM

## 2022-11-15 DIAGNOSIS — O10019 Pre-existing essential hypertension complicating pregnancy, unspecified trimester: Secondary | ICD-10-CM

## 2022-11-15 NOTE — Pre-Procedure Instructions (Signed)
Christina Six,LPN notified that patient did not show for her pre-op today.

## 2022-11-16 ENCOUNTER — Telehealth (INDEPENDENT_AMBULATORY_CARE_PROVIDER_SITE_OTHER): Payer: Medicaid Other | Admitting: Surgery

## 2022-11-16 DIAGNOSIS — L0591 Pilonidal cyst without abscess: Secondary | ICD-10-CM

## 2022-11-16 MED ORDER — SULFAMETHOXAZOLE-TRIMETHOPRIM 400-80 MG PO TABS: 1.0000 | ORAL_TABLET | Freq: Two times a day (BID) | ORAL | 0 refills | Status: AC

## 2022-11-16 NOTE — Telephone Encounter (Signed)
Patient called the office about rescheduling her pilonidal cyst excision.  She also states that she believes it is starting to get inflamed at this time.  Advised my office staff that I will send in a prescription for Bactrim, and put her on the schedule next Wednesday for follow-up with me to discuss rescheduling surgery and to reevaluate this area.  Theophilus Kinds, DO [facility] [address] [place], [place] [address] [phone] (office)

## 2022-11-17 ENCOUNTER — Encounter (HOSPITAL_COMMUNITY): Admission: RE | Payer: Self-pay | Source: Ambulatory Visit

## 2022-11-17 ENCOUNTER — Ambulatory Visit (HOSPITAL_COMMUNITY): Admission: RE | Admit: 2022-11-17 | Payer: Medicaid Other | Source: Ambulatory Visit | Admitting: Surgery

## 2022-11-17 SURGERY — EXCISION, SIMPLE PILONIDAL CYST
Anesthesia: Choice

## 2022-11-22 ENCOUNTER — Other Ambulatory Visit: Payer: Self-pay

## 2022-11-22 ENCOUNTER — Encounter: Payer: Self-pay | Admitting: Surgery

## 2022-11-22 ENCOUNTER — Ambulatory Visit: Payer: Medicaid Other | Admitting: Surgery

## 2022-11-22 VITALS — BP 124/86 | HR 83 | Temp 98.0°F | Resp 18 | Ht 62.0 in | Wt 215.0 lb

## 2022-11-22 DIAGNOSIS — L0591 Pilonidal cyst without abscess: Secondary | ICD-10-CM

## 2022-11-22 NOTE — Progress Notes (Signed)
[facility] Note   HPI:  36 y.o. Female presents to clinic for follow-up evaluation of her pilonidal cyst.  She was scheduled for surgery on 12/1 to reschedule.  On 12/2, she began to have increasing pain and inflammation of the pilonidal cyst.  I sent in a prescription for Bactrim which she has been taking and has 2 days.  She states that the cyst opened and began draining.  Since then her pain has resolved.  She did apply warm compresses to the cyst as well.  She denies any fevers or chills.  She is tolerating a diet without issue and moving her bowels.  Review of Systems:  All other review of systems: otherwise negative   Vital Signs:  BP 124/86   Pulse 83   Temp 98 F (36.7 C) (Oral)   Resp 18   Ht 5\' 2"  (1.575 m)   Wt 215 lb (97.5 kg)   SpO2 96%   BMI 39.32 kg/m    Physical Exam:  Physical Exam Vitals reviewed.  Constitutional:      Appearance: Normal appearance.  Skin:    Comments: Pilonidal cyst on left buttock with surrounding induration and punctate opening, no active drainage, no fluctuance, nontender to palpation  Neurological:     Mental Status: She is alert.    Laboratory studies: None   Imaging:  None  Assessment:  36 y.o. yo Female who presents for follow-up evaluation of her pilonidal cyst after concern for inflammation and possible infection  Plan:  -Given that the area had drained, is currently nontender, and has no area of fluctuance, no need for incision and drainage at this time -However, given her current induration, I recommend that we wait a couple of weeks for the induration and inflammation to improve prior to surgical excision -Advised her to take some Tylenol for anti-inflammatory properties and to continue with sitz bath's -Finish the remaining course of antibiotics -Patient tentatively scheduled for surgery pilonidal cyst excision on 12/21 -If the area worsens or progresses, she should call the office and follow-up.  I also  advised her to call 1/22 if the induration fails to improve by our scheduled surgery date  All of the above recommendations were discussed with the patient , and all of patient's were answered to her expressed satisfaction.  [place], DO [facility] [address] [place], [place] [phone] (office)

## 2022-11-22 NOTE — H&P (Signed)
[facility] History and Physical   Reason for Referral: Pilonidal cyst Referring Physician: Edwin Dada, FNP   Chief Complaint   New Patient (Initial Visit)        Stephanie Cook is a 36 y.o. female.  HPI: Patient presents for evaluation of pilonidal cyst.  It has been present since 2019, and she has required incision and drainage and antibiotics for different times.  The most recent time being in August of this year.  When the area flares, she has been pain associated with it.  She denies any issues since the last time it flared in August.  Her past medical history is significant for hypertension and mood disorder.  She denies any history of surgeries.  She denies use of blood thinning medications.  She smokes 1/2 pack of cigarettes per day and drinks average of 5 beers per day.  She denies use of any illicit drugs.  She was scheduled for surgery on 12/1 to reschedule.  On 12/2, she began to have increasing pain and inflammation of the pilonidal cyst.  I sent in a prescription for Bactrim which she has been taking and has 2 days.  She states that the cyst opened and began draining.  Since then her pain has resolved.  She did apply warm compresses to the cyst as well.  She denies any fevers or chills.  She is tolerating a diet without issue and moving her bowels.        Past Medical History:  Diagnosis Date   Anxiety     Depression     Frequent urination 09/28/2015   HSV-2 (herpes simplex virus 2) infection     Hx of chlamydia infection      multiple times   Hx of gonorrhea     Hx of trichomoniasis      recurrent, 4 times   Hypertension     Missed period 09/28/2015   Pregnant     Preterm labor     STD (female)      chlamydia, trichomonas, gonorrhea, HPV, HSV           Past Surgical History:  Procedure Laterality Date   NO PAST SURGERIES               Family History  Problem Relation Age of Onset   Lupus Mother     Cirrhosis Mother     ADD /  ADHD Mother     Schizophrenia Mother     Heart disease Father          had open heart surgery , not sure of what age and type of heart surgery   Hypertension Father     Hypertension Maternal Grandmother     Colon cancer Neg Hx     Breast cancer Neg Hx        Social History         Tobacco Use   Smoking status: Every Day      Packs/day: 0.25      Years: 8.00      Total pack years: 2.00      Types: Cigarettes   Smokeless tobacco: Never  Vaping Use   Vaping Use: Never used  Substance Use Topics   Alcohol use: Yes      Comment: occasionally   Drug use: Not Currently      Medications: I have reviewed the patient's current medications. Allergies as of 10/03/2022         Reactions  Coconut Fatty Acids      Ibuprofen Hives    Other reaction(s): Hives, Hives    Tramadol Hcl      headache            Medication List           Accurate as of October 03, 2022  1:09 PM. If you have any questions, ask your nurse or doctor.              amLODipine 10 MG tablet Commonly known as: NORVASC Take 1 tablet (10 mg total) by mouth daily.    busPIRone 5 MG tablet Commonly known as: BUSPAR Take 1 tablet (5 mg total) by mouth 3 (three) times daily.    medroxyPROGESTERone 150 MG/ML injection Commonly known as: DEPO-PROVERA INJECT INTO THE MUSCLE ONCE EVERY 3 MONTHS.    PARoxetine 10 MG tablet Commonly known as: Paxil Take 1 tablet (10 mg total) by mouth daily.               ROS:  Constitutional: negative for chills, fatigue, and fevers Eyes: negative for visual disturbance and pain Ears, nose, mouth, throat, and face: negative for ear drainage, sore throat, and sinus problems Respiratory: negative for cough, wheezing, and shortness of breath Cardiovascular: negative for chest pain and palpitations Gastrointestinal: positive for reflux symptoms, negative for abdominal pain, nausea, and vomiting Genitourinary:negative for dysuria, frequency, and urinary  retention Integument/breast: negative for dryness and rash Hematologic/lymphatic: negative for bleeding and lymphadenopathy Musculoskeletal:positive for back pain, negative for neck pain Neurological: negative for dizziness and tremors Endocrine: negative for temperature intolerance   BP 124/86   Pulse 83   Temp 98 F (36.7 C) (Oral)   Resp 18   Ht 5\' 2"  (1.575 m)   Wt 215 lb (97.5 kg)   SpO2 96%   BMI 39.32 kg/m    Physical Exam Vitals reviewed.  HENT:     Head: Normocephalic and atraumatic.  Eyes:     Extraocular Movements: Extraocular movements intact.     Pupils: Pupils are equal, round, and reactive to light.  Cardiovascular:     Rate and Rhythm: Normal rate and regular rhythm.  Pulmonary:     Effort: Pulmonary effort is normal.     Breath sounds: Normal breath sounds.  Abdominal:     General: There is no distension.     Palpations: Abdomen is soft.     Tenderness: There is no abdominal tenderness.  Musculoskeletal:        General: Normal range of motion.     Cervical back: Normal range of motion.  Skin:    General: Skin is warm and dry.     Comments: Pilonidal cyst on left buttock with surrounding induration and punctate opening, no active drainage, no fluctuance, nontender to palpation; small pit noted midline  Neurological:     General: No focal deficit present.     Mental Status: She is alert and oriented to person, place, and time.  Psychiatric:        Mood and Affect: Mood normal.        Behavior: Behavior normal.        Results: No results found for this or any previous visit (from the past 48 hour(s)).   No results found.     Assessment & Plan:  [name] is a 36 y.o. female who presents for evaluation of pilonidal cyst   -Given that the area had drained, is currently nontender, and has no  area of fluctuance, no need for incision and drainage at this time -However, given her current induration, I recommend that we wait a couple of weeks  for the induration and inflammation to improve prior to surgical excision -Advised her to take some Tylenol for anti-inflammatory properties and to continue with sitz bath's -Finish the remaining course of antibiotics -We again discussed that pilonidal cysts/ sinuses are abnormal tissue under the skin that is prone to infections. It is more common in patients that are overweight, family history of a pilonidal cyst, deep gluteal cleft and thicker body hair, especially in the region of the gluteal cleft. We discussed that the exact cause of the disease is unknown but could be related to ingrown hairs/ and inflammation in the area coupled with mechanical / shear forces. We discussed that these cysts and sinuses can be surgically removed but that they often recur.   -We discussed that surgery has a high risk of recurrence (as high as 50%). -The risk and benefits of pilonidal cyst excision were discussed including but not limited to bleeding, infection, injury to surrounding structures, cyst recurrence, and need for additional procedures.  After careful consideration, AYSIA LOWDER has decided to proceed with pilonidal cyst excision.  -Patient tentatively scheduled for surgery pilonidal cyst excision on 12/21 -If the area worsens or progresses, she should call the office and follow-up.  I also advised her to call us if the induration fails to improve by our scheduled surgery date    All questions were answered to the satisfaction of the patient and family.     Theophilus Kinds, DO [facility] [address] [place], [place] [address] [phone] (office)

## 2022-11-23 ENCOUNTER — Ambulatory Visit (INDEPENDENT_AMBULATORY_CARE_PROVIDER_SITE_OTHER): Payer: Medicaid Other | Admitting: *Deleted

## 2022-11-23 DIAGNOSIS — Z3042 Encounter for surveillance of injectable contraceptive: Secondary | ICD-10-CM | POA: Diagnosis not present

## 2022-11-23 MED ORDER — MEDROXYPROGESTERONE ACETATE 150 MG/ML IM SUSP
150.0000 mg | Freq: Once | INTRAMUSCULAR | Status: AC
  Administered 2022-11-23: 150 mg via INTRAMUSCULAR

## 2022-11-23 NOTE — Progress Notes (Signed)
   NURSE VISIT- INJECTION  SUBJECTIVE:  Stephanie Cook is a 36 y.o. [phone] female here for a Depo Provera for contraception/period management. She is a GYN patient.   OBJECTIVE:  There were no vitals taken for this visit.  Appears well, in no apparent distress  Injection administered in: Right upper quad. gluteus  Meds ordered this encounter  Medications   medroxyPROGESTERone (DEPO-PROVERA) injection 150 mg    ASSESSMENT: GYN patient Depo Provera for contraception/period management PLAN: Follow-up: in 11-13 weeks for next Depo   Malachy Mood  11/23/2022 9:36 AM

## 2022-11-27 ENCOUNTER — Ambulatory Visit: Payer: Medicaid Other | Admitting: Adult Health

## 2022-12-01 NOTE — Patient Instructions (Signed)
Stephanie Cook  12/01/2022     @PREFPERIOPPHARMACY @   Your procedure is scheduled on  12/07/2022.   Report to 12/09/2022 at  1045 A.M.   Call this number if you have problems the morning of surgery:  [phone]  If you experience any cold or flu symptoms such as cough, fever, chills, shortness of breath, etc. between now and your scheduled surgery, please notify [phone] at the above number.   Remember:  Do not eat or drink after midnight.      Take these medicines the morning of surgery with A SIP OF WATER                     Amlodipine, buspirone, paxil.    Do not wear jewelry, make-up or nail polish.  Do not wear lotions, powders, or perfumes, or deodorant.  Do not shave 48 hours prior to surgery.  Men may shave face and neck.  Do not bring valuables to the hospital.  [facility] is not responsible for any belongings or valuables.  Contacts, dentures or bridgework may not be worn into surgery.  Leave your suitcase in the car.  After surgery it may be brought to your room.  For patients admitted to the hospital, discharge time will be determined by your treatment team.  Patients discharged the day of surgery will not be allowed to drive home and must have someone with them for 24 hours    Special instructions:   DO NOT smoke tobacco or vape for 24 hours before your procedure.  Please read over the following fact sheets that you were given. Pain Booklet, Coughing and Deep Breathing, Surgical Site Infection Prevention, Anesthesia Post-op Instructions, and Care and Recovery After Surgery      Pilonidal Cyst Drainage, Care After The following information offers guidance on how to care for yourself after your procedure. Your health care provider may also give you more specific instructions. If you have problems or questions, contact your health care provider. What can I expect after the procedure? After the procedure, it is common to have: Pain that gets  better when you take medicine. Some fluid or blood coming from your wound. Follow these instructions at home: Medicines Take over-the-counter and prescription medicines only as told by your health care provider. If you were prescribed antibiotics, take them as told by your health care provider. Do not stop using the antibiotic even if you start to feel better. Ask your health care provider if the medicine prescribed to you: Requires you to avoid driving or using machinery. Can cause constipation. You may need to take these actions to prevent or treat constipation: Drink enough fluid to keep your urine pale yellow. Take over-the-counter or prescription medicines. Eat foods that are high in fiber, such as beans, whole grains, and fresh fruits and vegetables. Limit foods that are high in fat and processed sugars, such as fried or sweet foods. Bathing Do not take baths or showers, swim, or use a hot tub until your health care provider approves. You may only be allowed to take sponge baths. When you can return to bathing will depend on the type of wound that you have. If your wound was packed with a germ-free packing material, keep the area dry until your packing has been removed. After the packing has been removed, you may start taking showers when your health care provider approves. If the edges of the incision around your wound  were stitched to your skin (marsupialization), you may start taking showers the day after surgery, or when your health care provider approves. Let the water from the shower moisten your bandage (dressing). This will make it easier to take off. Remove your dressing before you shower. While bathing, clean your buttocks area gently with soap and water. After bathing: Pat the area dry with a soft, clean towel. If directed, cover the area with a clean dressing. Wound care  You may need to have a caregiver help you with wound care and dressing changes. Follow instructions  from your health care provider about how to take care of your wound. Make sure you: Wash your hands with soap and water for at least 20 seconds before and after you change your dressing. If soap and water are not available, use hand sanitizer. Change your dressing as told by your health care provider. Leave stitches (sutures), skin glue, or adhesive strips in place. These skin closures may need to stay in place for 2 weeks or longer. If adhesive strip edges start to loosen and curl up, you may trim the loose edges. Do not remove adhesive strips completely unless your health care provider tells you to do that. Check your wound every day for signs of infection. Check for: Redness, swelling, or more pain. More fluid or blood. Warmth. Pus or a bad smell. Follow any additional instructions from your health care provider on how to care for your wound, such as wound cleaning, wound flushing (irrigation), or packing your wound with a dressing. If you had marsupialization, ask your health care provider when you can stop using a dressing. Lifestyle Do not do activities that irritate or put pressure on your buttocks for about 2 weeks, or as long as told by your health care provider. These activities include bike riding, running, and anything that involves a twisting motion. Rest as told by your health care provider. Do not sit for a long time without moving. Get up to take short walks every 1-2 hours. This will improve blood flow and breathing. Ask for help if you feel weak or unsteady. Sleep on your side instead of your back. Avoid wearing tight underwear and tight pants. Return to your normal activities as told by your health care provider. Ask your health care provider what activities are safe for you. General instructions Do not use any products that contain nicotine or tobacco. These products include cigarettes, chewing tobacco, and vaping devices, such as e-cigarettes. These can delay wound healing  after surgery. If you need help quitting, ask your health care provider. Keep all follow-up visits. This is important to monitor healing. If you had an incision and drainage procedure with wound packing, your packing may be changed or removed at follow-up visits. Contact a health care provider if: You have pain that does not get better with medicine. You have any of these signs of infection: More redness, swelling, or pain around your incision. More fluid or blood coming from your incision. Warmth coming from your incision. Pus or a bad smell coming from your incision. A fever. You have muscle aches. You feel generally sick. You are dizzy. Summary A pilonidal cyst is a fluid-filled sac that forms under the skin near the tailbone. It is common to have some fluid or blood coming from your wound after a procedure to drain a pilonidal cyst. If you were prescribed antibiotics, take them as told by your health care provider. Do not stop taking the antibiotic even if  you start to feel better. You may need to have a caregiver help you with wound care and dressing changes. Return to your health care provider as instructed to have any packing material changed or removed. This information is not intended to replace advice given to you by your health care provider. Make sure you discuss any questions you have with your health care provider. Document Revised: 03/01/2022 Document Reviewed: 03/01/2022 Elsevier Patient Education  2023 [employer] General Anesthesia, Adult, Care After The following information offers guidance on how to care for yourself after your procedure. Your health care provider may also give you more specific instructions. If you have problems or questions, contact your health care provider. What can I expect after the procedure? After the procedure, it is common for people to: Have pain or discomfort at the IV site. Have nausea or vomiting. Have a sore throat or hoarseness. Have  trouble concentrating. Feel cold or chills. Feel weak, sleepy, or tired (fatigue). Have soreness and body aches. These can affect parts of the body that were not involved in surgery. Follow these instructions at home: For the time period you were told by your health care provider:  Rest. Do not participate in activities where you could fall or become injured. Do not drive or use machinery. Do not drink alcohol. Do not take sleeping pills or medicines that cause drowsiness. Do not make important decisions or sign legal documents. Do not take care of children on your own. General instructions Drink enough fluid to keep your urine pale yellow. If you have sleep apnea, surgery and certain medicines can increase your risk for breathing problems. Follow instructions from your health care provider about wearing your sleep device: Anytime you are sleeping, including during daytime naps. While taking prescription pain medicines, sleeping medicines, or medicines that make you drowsy. Return to your normal activities as told by your health care provider. Ask your health care provider what activities are safe for you. Take over-the-counter and prescription medicines only as told by your health care provider. Do not use any products that contain nicotine or tobacco. These products include cigarettes, chewing tobacco, and vaping devices, such as e-cigarettes. These can delay incision healing after surgery. If you need help quitting, ask your health care provider. Contact a health care provider if: You have nausea or vomiting that does not get better with medicine. You vomit every time you eat or drink. You have pain that does not get better with medicine. You cannot urinate or have bloody urine. You develop a skin rash. You have a fever. Get help right away if: You have trouble breathing. You have chest pain. You vomit blood. These symptoms may be an emergency. Get help right away. Call 911. Do  not wait to see if the symptoms will go away. Do not drive yourself to the hospital. Summary After the procedure, it is common to have a sore throat, hoarseness, nausea, vomiting, or to feel weak, sleepy, or fatigue. For the time period you were told by your health care provider, do not drive or use machinery. Get help right away if you have difficulty breathing, have chest pain, or vomit blood. These symptoms may be an emergency. This information is not intended to replace advice given to you by your health care provider. Make sure you discuss any questions you have with your health care provider. Document Revised: 03/03/2022 Document Reviewed: 03/03/2022 Elsevier Patient Education  2023 [employer].

## 2022-12-04 ENCOUNTER — Encounter (HOSPITAL_COMMUNITY): Payer: Self-pay

## 2022-12-04 ENCOUNTER — Encounter (HOSPITAL_COMMUNITY)
Admission: RE | Admit: 2022-12-04 | Discharge: 2022-12-04 | Disposition: A | Discharge status: Home / Self Care | Payer: Medicaid Other | Source: Ambulatory Visit | Attending: Surgery | Admitting: Surgery

## 2022-12-05 ENCOUNTER — Encounter (HOSPITAL_COMMUNITY): Payer: Self-pay

## 2022-12-05 ENCOUNTER — Encounter (HOSPITAL_COMMUNITY)
Admission: RE | Admit: 2022-12-05 | Discharge: 2022-12-05 | Disposition: A | Discharge status: Home / Self Care | Payer: Medicaid Other | Source: Ambulatory Visit | Attending: Surgery | Admitting: Surgery

## 2022-12-05 ENCOUNTER — Ambulatory Visit: Payer: Medicaid Other | Admitting: Family Medicine

## 2022-12-05 DIAGNOSIS — Z01818 Encounter for other preprocedural examination: Secondary | ICD-10-CM | POA: Insufficient documentation

## 2022-12-05 DIAGNOSIS — I498 Other specified cardiac arrhythmias: Secondary | ICD-10-CM | POA: Diagnosis not present

## 2022-12-05 LAB — POCT PREGNANCY, URINE: Preg Test, Ur: NEGATIVE

## 2022-12-07 ENCOUNTER — Encounter (HOSPITAL_COMMUNITY): Payer: Self-pay | Admitting: Surgery

## 2022-12-07 ENCOUNTER — Ambulatory Visit (HOSPITAL_BASED_OUTPATIENT_CLINIC_OR_DEPARTMENT_OTHER): Payer: Medicaid Other | Admitting: Certified Registered"

## 2022-12-07 ENCOUNTER — Ambulatory Visit (HOSPITAL_COMMUNITY): Payer: Medicaid Other | Admitting: Certified Registered"

## 2022-12-07 ENCOUNTER — Encounter (HOSPITAL_COMMUNITY)
Admission: RE | Disposition: A | Discharge status: Home / Self Care | Payer: Self-pay | Source: Ambulatory Visit | Attending: Surgery

## 2022-12-07 ENCOUNTER — Other Ambulatory Visit: Payer: Self-pay

## 2022-12-07 ENCOUNTER — Ambulatory Visit (HOSPITAL_COMMUNITY)
Admission: RE | Admit: 2022-12-07 | Discharge: 2022-12-07 | Disposition: A | Discharge status: Home / Self Care | Payer: Medicaid Other | Source: Ambulatory Visit | Attending: Surgery | Admitting: Surgery

## 2022-12-07 DIAGNOSIS — Z6839 Body mass index (BMI) 39.0-39.9, adult: Secondary | ICD-10-CM | POA: Diagnosis not present

## 2022-12-07 DIAGNOSIS — F32A Depression, unspecified: Secondary | ICD-10-CM | POA: Diagnosis not present

## 2022-12-07 DIAGNOSIS — F419 Anxiety disorder, unspecified: Secondary | ICD-10-CM | POA: Insufficient documentation

## 2022-12-07 DIAGNOSIS — I10 Essential (primary) hypertension: Secondary | ICD-10-CM | POA: Insufficient documentation

## 2022-12-07 DIAGNOSIS — L0591 Pilonidal cyst without abscess: Secondary | ICD-10-CM | POA: Insufficient documentation

## 2022-12-07 DIAGNOSIS — F17210 Nicotine dependence, cigarettes, uncomplicated: Secondary | ICD-10-CM

## 2022-12-07 DIAGNOSIS — F418 Other specified anxiety disorders: Secondary | ICD-10-CM

## 2022-12-07 HISTORY — PX: PILONIDAL CYST EXCISION: SHX744

## 2022-12-07 SURGERY — EXCISION, SIMPLE PILONIDAL CYST
Anesthesia: General | Site: Back

## 2022-12-07 MED ORDER — METHYLENE BLUE 1 % INJ SOLN
INTRAVENOUS | Status: AC
  Filled 2022-12-07: qty 10

## 2022-12-07 MED ORDER — BUPIVACAINE LIPOSOME 1.3 % IJ SUSP
INTRAMUSCULAR | Status: AC
  Filled 2022-12-07: qty 20

## 2022-12-07 MED ORDER — CHLORHEXIDINE GLUCONATE CLOTH 2 % EX PADS: 6.0000 | MEDICATED_PAD | Freq: Once | CUTANEOUS | Status: DC

## 2022-12-07 MED ORDER — CEFAZOLIN SODIUM-DEXTROSE 2-4 GM/100ML-% IV SOLN
INTRAVENOUS | Status: AC
  Filled 2022-12-07: qty 100

## 2022-12-07 MED ORDER — FENTANYL CITRATE PF 50 MCG/ML IJ SOSY: 25.0000 ug | PREFILLED_SYRINGE | INTRAMUSCULAR | Status: DC | PRN

## 2022-12-07 MED ORDER — BUPIVACAINE LIPOSOME 1.3 % IJ SUSP
INTRAMUSCULAR | Status: DC | PRN
  Administered 2022-12-07: 20 mL

## 2022-12-07 MED ORDER — CHLORHEXIDINE GLUCONATE 0.12 % MT SOLN: 15.0000 mL | Freq: Once | OROMUCOSAL | Status: DC

## 2022-12-07 MED ORDER — SODIUM CHLORIDE 0.9 % IR SOLN
Status: DC | PRN
  Administered 2022-12-07: 1000 mL

## 2022-12-07 MED ORDER — BACITRACIN-NEOMYCIN-POLYMYXIN OINTMENT TUBE
TOPICAL_OINTMENT | CUTANEOUS | Status: DC | PRN
  Administered 2022-12-07: 1 via TOPICAL

## 2022-12-07 MED ORDER — DEXMEDETOMIDINE HCL IN NACL 80 MCG/20ML IV SOLN
INTRAVENOUS | Status: DC | PRN
  Administered 2022-12-07: 4 ug via INTRAVENOUS
  Administered 2022-12-07 (×2): 8 ug via INTRAVENOUS

## 2022-12-07 MED ORDER — PROPOFOL 10 MG/ML IV BOLUS
INTRAVENOUS | Status: DC | PRN
  Administered 2022-12-07: 50 mg via INTRAVENOUS
  Administered 2022-12-07: 150 mg via INTRAVENOUS

## 2022-12-07 MED ORDER — MIDAZOLAM HCL 2 MG/2ML IJ SOLN
INTRAMUSCULAR | Status: AC
  Filled 2022-12-07: qty 2

## 2022-12-07 MED ORDER — LACTATED RINGERS IV SOLN: INTRAVENOUS | Status: DC | PRN

## 2022-12-07 MED ORDER — FENTANYL CITRATE (PF) 250 MCG/5ML IJ SOLN
INTRAMUSCULAR | Status: DC | PRN
  Administered 2022-12-07 (×4): 50 ug via INTRAVENOUS
  Administered 2022-12-07: 100 ug via INTRAVENOUS
  Administered 2022-12-07: 50 ug via INTRAVENOUS

## 2022-12-07 MED ORDER — LACTATED RINGERS IV SOLN: INTRAVENOUS | Status: DC

## 2022-12-07 MED ORDER — OXYCODONE HCL 5 MG/5ML PO SOLN: 5.0000 mg | Freq: Once | ORAL | Status: DC | PRN

## 2022-12-07 MED ORDER — FENTANYL CITRATE (PF) 100 MCG/2ML IJ SOLN
INTRAMUSCULAR | Status: AC
  Filled 2022-12-07: qty 2

## 2022-12-07 MED ORDER — ONDANSETRON HCL 4 MG/2ML IJ SOLN
INTRAMUSCULAR | Status: AC
  Filled 2022-12-07: qty 2

## 2022-12-07 MED ORDER — TRIPLE ANTIBIOTIC 5-400-5000 EX OINT: TOPICAL_OINTMENT | Freq: Four times a day (QID) | CUTANEOUS | 0 refills | Status: DC

## 2022-12-07 MED ORDER — OXYCODONE HCL 5 MG PO TABS: 5.0000 mg | ORAL_TABLET | Freq: Once | ORAL | Status: DC | PRN

## 2022-12-07 MED ORDER — TRIPLE ANTIBIOTIC 3.5-400-5000 EX OINT
TOPICAL_OINTMENT | CUTANEOUS | Status: AC
  Filled 2022-12-07: qty 1

## 2022-12-07 MED ORDER — MIDAZOLAM HCL 2 MG/2ML IJ SOLN
INTRAMUSCULAR | Status: DC | PRN
  Administered 2022-12-07: 2 mg via INTRAVENOUS

## 2022-12-07 MED ORDER — ESMOLOL HCL 100 MG/10ML IV SOLN
INTRAVENOUS | Status: DC | PRN
  Administered 2022-12-07: 20 ug via INTRAVENOUS

## 2022-12-07 MED ORDER — ORAL CARE MOUTH RINSE: 15.0000 mL | Freq: Once | OROMUCOSAL | Status: DC

## 2022-12-07 MED ORDER — CEFAZOLIN SODIUM-DEXTROSE 2-4 GM/100ML-% IV SOLN
2.0000 g | INTRAVENOUS | Status: AC
  Administered 2022-12-07: 2 g via INTRAVENOUS

## 2022-12-07 MED ORDER — ACETAMINOPHEN 500 MG PO TABS: 1000.0000 mg | ORAL_TABLET | Freq: Four times a day (QID) | ORAL | 0 refills | Status: AC

## 2022-12-07 MED ORDER — OXYCODONE HCL 5 MG PO TABS: 5.0000 mg | ORAL_TABLET | Freq: Four times a day (QID) | ORAL | 0 refills | Status: DC | PRN

## 2022-12-07 MED ORDER — LIDOCAINE HCL (CARDIAC) PF 100 MG/5ML IV SOSY
PREFILLED_SYRINGE | INTRAVENOUS | Status: DC | PRN
  Administered 2022-12-07: 100 mg via INTRAVENOUS

## 2022-12-07 MED ORDER — PROPOFOL 10 MG/ML IV BOLUS
INTRAVENOUS | Status: AC
  Filled 2022-12-07: qty 20

## 2022-12-07 MED ORDER — DEXAMETHASONE SODIUM PHOSPHATE 10 MG/ML IJ SOLN
INTRAMUSCULAR | Status: DC | PRN
  Administered 2022-12-07: 10 mg via INTRAVENOUS

## 2022-12-07 MED ORDER — SUCCINYLCHOLINE CHLORIDE 200 MG/10ML IV SOSY
PREFILLED_SYRINGE | INTRAVENOUS | Status: AC
  Filled 2022-12-07: qty 20

## 2022-12-07 MED ORDER — LIDOCAINE HCL (PF) 2 % IJ SOLN
INTRAMUSCULAR | Status: AC
  Filled 2022-12-07: qty 5

## 2022-12-07 MED ORDER — FENTANYL CITRATE (PF) 250 MCG/5ML IJ SOLN
INTRAMUSCULAR | Status: AC
  Filled 2022-12-07: qty 5

## 2022-12-07 MED ORDER — DEXAMETHASONE SODIUM PHOSPHATE 10 MG/ML IJ SOLN
INTRAMUSCULAR | Status: AC
  Filled 2022-12-07: qty 1

## 2022-12-07 MED ORDER — ONDANSETRON HCL 4 MG/2ML IJ SOLN: 4.0000 mg | Freq: Once | INTRAMUSCULAR | Status: DC | PRN

## 2022-12-07 MED ORDER — DOCUSATE SODIUM 100 MG PO CAPS: 100.0000 mg | ORAL_CAPSULE | Freq: Two times a day (BID) | ORAL | 2 refills | Status: DC

## 2022-12-07 MED ORDER — SUCCINYLCHOLINE CHLORIDE 200 MG/10ML IV SOSY
PREFILLED_SYRINGE | INTRAVENOUS | Status: DC | PRN
  Administered 2022-12-07: 100 mg via INTRAVENOUS

## 2022-12-07 MED ORDER — GLYCOPYRROLATE PF 0.2 MG/ML IJ SOSY
PREFILLED_SYRINGE | INTRAMUSCULAR | Status: AC
  Filled 2022-12-07: qty 1

## 2022-12-07 MED ORDER — GLYCOPYRROLATE PF 0.2 MG/ML IJ SOSY
PREFILLED_SYRINGE | INTRAMUSCULAR | Status: DC | PRN
  Administered 2022-12-07: .2 mg via INTRAVENOUS

## 2022-12-07 MED ORDER — ONDANSETRON HCL 4 MG/2ML IJ SOLN
INTRAMUSCULAR | Status: DC | PRN
  Administered 2022-12-07: 4 mg via INTRAVENOUS

## 2022-12-07 SURGICAL SUPPLY — 28 items
CANNULA VESSEL 3MM 2 BLNT TIP (CANNULA) ×2 IMPLANT
CLOTH BEACON ORANGE TIMEOUT ST (SAFETY) ×1 IMPLANT
COVER LIGHT HANDLE STERIS (MISCELLANEOUS) ×2 IMPLANT
ELECT REM PT RETURN 9FT ADLT (ELECTROSURGICAL) ×1
ELECTRODE REM PT RTRN 9FT ADLT (ELECTROSURGICAL) ×1 IMPLANT
GAUZE PETROLATUM 1 X8 (GAUZE/BANDAGES/DRESSINGS) IMPLANT
GAUZE SPONGE 4X4 12PLY STRL (GAUZE/BANDAGES/DRESSINGS) ×2 IMPLANT
GLOVE BIOGEL PI IND STRL 6.5 (GLOVE) ×1 IMPLANT
GLOVE BIOGEL PI IND STRL 7.0 (GLOVE) ×2 IMPLANT
GLOVE SURG SS PI 6.5 STRL IVOR (GLOVE) ×1 IMPLANT
GOWN STRL REUS W/TWL LRG LVL3 (GOWN DISPOSABLE) ×2 IMPLANT
KIT TURNOVER KIT A (KITS) ×1 IMPLANT
MANIFOLD NEPTUNE II (INSTRUMENTS) ×1 IMPLANT
NDL HYPO 21X1.5 SAFETY (NEEDLE) ×1 IMPLANT
NEEDLE HYPO 21X1.5 SAFETY (NEEDLE) ×1 IMPLANT
NS IRRIG 1000ML POUR BTL (IV SOLUTION) ×1 IMPLANT
PACK MINOR (CUSTOM PROCEDURE TRAY) ×1 IMPLANT
PAD ARMBOARD 7.5X6 YLW CONV (MISCELLANEOUS) ×2 IMPLANT
PENCIL SMOKE EVACUATOR (MISCELLANEOUS) ×1 IMPLANT
SET BASIN LINEN APH (SET/KITS/TRAYS/PACK) ×1 IMPLANT
SPONGE T-LAP 18X18 ~~LOC~~+RFID (SPONGE) ×1 IMPLANT
SUT ETHILON 3 0 FSL (SUTURE) IMPLANT
SUT VIC AB 2-0 CT2 27 (SUTURE) IMPLANT
SUT VIC AB 3-0 SH 27 (SUTURE) ×1
SUT VIC AB 3-0 SH 27X BRD (SUTURE) IMPLANT
SYR 20ML LL LF (SYRINGE) ×2 IMPLANT
SYR CONTROL 10ML LL (SYRINGE) ×1 IMPLANT
TAPE CLOTH SURG 4X10 WHT LF (GAUZE/BANDAGES/DRESSINGS) IMPLANT

## 2022-12-07 NOTE — Interval H&P Note (Signed)
History and Physical Interval Note:  12/07/2022 12:23 PM  Stephanie Cook  has presented today for surgery, with the diagnosis of PILONIDAL CYST.  The various methods of treatment have been discussed with the patient and family. After consideration of risks, benefits and other options for treatment, the patient has consented to  Procedure(s): CYST EXCISION PILONIDAL SIMPLE (N/A) as a surgical intervention.  The patient's history has been reviewed, patient examined, no change in status, stable for surgery.  I have reviewed the patient's chart and labs.  Questions were answered to the patient's satisfaction.     Sheli Dorin A Jewel Venditto

## 2022-12-07 NOTE — Discharge Instructions (Signed)
Ambulatory Surgery Discharge Instructions  General Anesthesia or Sedation Do not drive or operate heavy machinery for 24 hours.  Do not consume alcohol, tranquilizers, sleeping medications, or any non-prescribed medications for 24 hours. Do not make important decisions or sign any important papers in the next 24 hours. You should have someone with you tonight at home.  Activity  You are advised to go directly home from the hospital.  Restrict your activities and rest for a day.  Resume light activity tomorrow. Try to off load and not put direct pressure on your incision site  Fluids and Diet Begin with clear liquids, bouillon, dry toast, soda crackers.  If not nauseated, you may go to a regular diet when you desire.  Greasy and spicy foods are not advised.  Medications  If you have not had a bowel movement in 24 hours, take 2 tablespoons over the counter Milk of mag.             You May resume your blood thinners tomorrow (Aspirin, coumadin, or other).  You are being discharged with prescriptions for Opioid/Narcotic Medications: There are some specific considerations for these medications that you should know. Opioid Meds have risks & benefits. Addiction to these meds is always a concern with prolonged use Take medication only as directed Do not drive while taking narcotic pain medication Do not crush tablets or capsules Do not use a different container than medication was dispensed in Lock the container of medication in a cool, dry place out of reach of children and pets. Opioid medication can cause addiction Do not share with anyone else (this is a felony) Do not store medications for future use. Dispose of them properly.     Disposal:  Find a [employer] household drug take back site near you.  If you can't get to a drug take back site, use the recipe below as a last resort to dispose of expired, unused or unwanted drugs. Disposal  (Do not dispose chemotherapy drugs this way,  talk to your prescribing doctor instead.) Step 1: Mix drugs (do not crush) with dirt, kitty litter, or used coffee grounds and add a small amount of water to dissolve any solid medications. Step 2: Seal drugs in plastic bag. Step 3: Place plastic bag in trash. Step 4: Take prescription container and scratch out personal information, then recycle or throw away.  Operative Site  You have external stitches that will be removed in office.  Apply antibiotic ointment to your incision site after showering. Ok to English as a second language teacher. Keep wound clean and dry. No baths or swimming. No lifting more than 10 pounds.  Contact Information: If you have questions or concerns, please call our office, [phone], Monday- Thursday 8AM-5PM and Friday 8AM-12Noon.  If it is after hours or on the weekend, please call [facility] Main Number, [phone], and ask to speak to the surgeon on call for Dr. Robyne Peers at [facility].   SPECIFIC COMPLICATIONS TO WATCH FOR: Inability to urinate Fever over 101? F by mouth Nausea and vomiting lasting longer than 24 hours. Pain not relieved by medication ordered Swelling around the operative site Increased redness, warmth, hardness, around operative area Numbness, tingling, or cold fingers or toes Blood -soaked dressing, (small amounts of oozing may be normal) Increasing and progressive drainage from surgical area or exam site

## 2022-12-07 NOTE — Anesthesia Procedure Notes (Signed)
Procedure Name: Intubation Date/Time: 12/07/2022 12:40 PM  Performed by: Sherian Maroon, CRNAPre-anesthesia Checklist: Patient identified, Emergency Drugs available, Suction available and Patient being monitored Patient Re-evaluated:Patient Re-evaluated prior to induction Oxygen Delivery Method: Circle system utilized Preoxygenation: Pre-oxygenation with 100% oxygen Induction Type: IV induction Ventilation: Mask ventilation without difficulty Laryngoscope Size: Mac and 3 Grade View: Grade I Tube type: Oral Tube size: 7.0 mm Number of attempts: 1 Airway Equipment and Method: Stylet Placement Confirmation: ETT inserted through vocal cords under direct vision, positive ETCO2 and breath sounds checked- equal and bilateral Tube secured with: Tape Dental Injury: Teeth and Oropharynx as per pre-operative assessment

## 2022-12-07 NOTE — Anesthesia Preprocedure Evaluation (Signed)
Anesthesia Evaluation  Patient identified by MRN, date of birth, ID band Patient awake    Reviewed: Allergy & Precautions, H&P , NPO status , Patient's Chart, lab work & pertinent test results, reviewed documented beta blocker date and time   Airway Mallampati: II  TM Distance: >3 FB Neck ROM: full    Dental no notable dental hx.    Pulmonary neg pulmonary ROS, Current Smoker and Patient abstained from smoking.   Pulmonary exam normal breath sounds clear to auscultation       Cardiovascular Exercise Tolerance: Good hypertension, negative cardio ROS  Rhythm:regular Rate:Normal     Neuro/Psych  PSYCHIATRIC DISORDERS Anxiety Depression    negative neurological ROS     GI/Hepatic negative GI ROS, Neg liver ROS,,,  Endo/Other    Morbid obesity  Renal/GU negative Renal ROS  negative genitourinary   Musculoskeletal negative musculoskeletal ROS (+)    Abdominal   Peds negative pediatric ROS (+)  Hematology negative hematology ROS (+)   Anesthesia Other Findings   Reproductive/Obstetrics negative OB ROS                             Anesthesia Physical Anesthesia Plan  ASA: 3  Anesthesia Plan: General and General LMA   Post-op Pain Management:    Induction:   PONV Risk Score and Plan: Ondansetron  Airway Management Planned:   Additional Equipment:   Intra-op Plan:   Post-operative Plan:   Informed Consent: I have reviewed the patients History and Physical, chart, labs and discussed the procedure including the risks, benefits and alternatives for the proposed anesthesia with the patient or authorized representative who has indicated his/her understanding and acceptance.     Dental Advisory Given  Plan Discussed with: CRNA  Anesthesia Plan Comments:        Anesthesia Quick Evaluation

## 2022-12-07 NOTE — Transfer of Care (Signed)
Immediate Anesthesia Transfer of Care Note  Patient: Stephanie Cook  Procedure(s) Performed: CYST EXCISION PILONIDAL SIMPLE (Back)  Patient Location: PACU  Anesthesia Type:General  Level of Consciousness: awake, alert , oriented, and patient cooperative  Airway & Oxygen Therapy: Patient Spontanous Breathing  Post-op Assessment: Report given to RN, Post -op Vital signs reviewed and stable, and Patient moving all extremities X 4  Post vital signs: Reviewed and stable  Last Vitals:  Vitals Value Taken Time  BP 123/85 12/07/22 1356  Temp 97.9   Pulse 91 12/07/22 1358  Resp 15 12/07/22 1358  SpO2 100 % 12/07/22 1358  Vitals shown include unvalidated device data.  Last Pain:  Vitals:   12/07/22 1104  TempSrc: Oral  PainSc: 0-No pain      Patients Stated Pain Goal: 8 (12/07/22 1104)  Complications: No notable events documented.

## 2022-12-07 NOTE — Progress Notes (Signed)
[facility]  Spoke with the patient's father and significant other in the consultation room.  I explained that she tolerated the procedure without difficulty.  She has external stitches that will be removed in office in 2 weeks.  I discharged her home with a prescription for narcotic pain medication that they should take as needed for pain.  I also want her taking scheduled Tylenol.  If they take the narcotic pain medication, they should take a stool softener as well.  The patient will follow-up with me in 2 weeks for suture removal.  All questions were answered to their expressed satisfaction.  Theophilus Kinds, DO [facility] [address] [place], [place] [address] [phone] (office)

## 2022-12-07 NOTE — Op Note (Signed)
[facility] Operative Note  12/07/22  Preoperative Diagnosis: Pilonidal Cyst    Postoperative Diagnosis: Same   Procedure(s) Performed:  Excision of pilonidal cyst   Surgeon: Theophilus Kinds, DO    Assistants: No qualified resident was available    Anesthesia: General endotracheal   Anesthesiologist: Windell Norfolk, MD    Specimens: Pilonidal cyst   Estimated Blood Loss: Minimal   Blood Replacement: None    Complications: None   Wound Class: Clean   Operative Indications: Patient is a 36 year old female who presents for pilonidal cyst excision.  She has required multiple incision and drainages in the past, and she would like this area excised.  She is agreeable to surgery.  All risks and benefits of performing this procedure were discussed with the patient including pain, infection, bleeding, damage to the surrounding structures, and need for more procedures or surgery. The patient voiced understanding of the procedure, all questions were sought and answered, and consent was obtained.  Findings: Pilonidal cyst with 1 tract to the skin   Procedure: The patient was taken to the operating room and placed supine. General endotracheal anesthesia was induced. Intravenous antibiotics were administered per protocol.  Patient was then positioned in a prone position. The gluteal cleft and buttocks were prepared and draped in the usual sterile fashion.  Timeout was performed.  An elliptical incision was made over the previous pilonidal cyst and incision and drainage site.  The midline pit was also included within this excision, which was slightly off of midline.  Using electrocautery, the pilonidal cyst was dissected from the subcutaneous tissues.  The pilonidal cyst was completely excised and sent to pathology for evaluation.  Hemostasis was achieved with electrocautery.  The wound was then irrigated with saline.  Bupivicaine was injected into the field.   Hemostasis was confirmed.  The deep tissue was closed with 2-0 Vicryl in an interrupted fashion.  The dermis was closed with a 3-0 Vicryl subcuticular stitch.  The skin was then closed with 3-0 nylon in a vertical mattress fashion.  Bacitracin was then applied over the incision.  The surgical site was then dressed with Vaseline gauze, 4 x 4's, and Medipore tape.  All counts were correct at the end of the case. The patient was awakened from anesthesia and extubated without complication.  The patient went to the PACU in stable condition.  Theophilus Kinds, DO  [facility] [address] [place], [place] [address] [phone] (office)

## 2022-12-08 LAB — SURGICAL PATHOLOGY

## 2022-12-08 NOTE — Anesthesia Postprocedure Evaluation (Signed)
Anesthesia Post Note  Patient: Stephanie Cook  Procedure(s) Performed: CYST EXCISION PILONIDAL SIMPLE (Back)  Patient location during evaluation: Phase II Anesthesia Type: General Level of consciousness: awake Pain management: pain level controlled Vital Signs Assessment: post-procedure vital signs reviewed and stable Respiratory status: spontaneous breathing and respiratory function stable Cardiovascular status: blood pressure returned to baseline and stable Postop Assessment: no headache and no apparent nausea or vomiting Anesthetic complications: no Comments: Late entry   No notable events documented.   Last Vitals:  Vitals:   12/07/22 1400 12/07/22 1423  BP: 122/86 (!) 127/97  Pulse: 82 82  Resp: 14 16  Temp:    SpO2: 100% 96%    Last Pain:  Vitals:   12/07/22 1423  TempSrc:   PainSc: 0-No pain                 Stephanie Cook

## 2022-12-13 ENCOUNTER — Other Ambulatory Visit: Payer: Self-pay | Admitting: Surgery

## 2022-12-13 DIAGNOSIS — L0591 Pilonidal cyst without abscess: Secondary | ICD-10-CM

## 2022-12-13 MED ORDER — OXYCODONE HCL 5 MG PO TABS: 5.0000 mg | ORAL_TABLET | Freq: Four times a day (QID) | ORAL | 0 refills | Status: DC | PRN

## 2022-12-20 ENCOUNTER — Encounter (HOSPITAL_COMMUNITY): Payer: Self-pay | Admitting: Surgery

## 2022-12-21 ENCOUNTER — Encounter: Payer: Self-pay | Admitting: Surgery

## 2022-12-21 ENCOUNTER — Ambulatory Visit (INDEPENDENT_AMBULATORY_CARE_PROVIDER_SITE_OTHER): Payer: Medicaid Other | Admitting: Surgery

## 2022-12-21 VITALS — BP 165/125 | HR 101 | Temp 98.9°F | Resp 14 | Ht 62.0 in | Wt 215.0 lb

## 2022-12-21 DIAGNOSIS — T8131XA Disruption of external operation (surgical) wound, not elsewhere classified, initial encounter: Secondary | ICD-10-CM

## 2022-12-21 MED ORDER — OXYCODONE HCL 5 MG PO TABS: 5.0000 mg | ORAL_TABLET | Freq: Four times a day (QID) | ORAL | 0 refills | Status: DC | PRN

## 2022-12-21 NOTE — Progress Notes (Signed)
[facility] Note   HPI:  37 y.o. Female presents to clinic for post-op follow-up status post pilonidal cyst excision on 12/21.  About 1 week postop, patient states that the surgical site opened and began draining pink-colored fluid.  Patient was very concerned that this area was infected.  She has pain associated with this site, for which she is still taking the narcotic pain medications.  She is tolerating a diet without nausea and vomiting, and moving her bowels without issue.  She denies any fevers or chills.  Review of Systems:  All other review of systems: otherwise negative   Vital Signs:  BP (!) 165/125   Pulse (!) 101   Temp 98.9 F (37.2 C) (Oral)   Resp 14   Ht 5\' 2"  (1.575 m)   Wt 215 lb (97.5 kg)   SpO2 95%   BMI 39.32 kg/m    Physical Exam:  Physical Exam Vitals reviewed.  Constitutional:      Appearance: Normal appearance.  Skin:    Comments: Pilonidal cyst excision site with 2.5 cm area of dehiscence, pink granulation tissue at the base with a small amount of fibrinous exudate, no surrounding erythema or induration, no purulent drainage  Neurological:     Mental Status: She is alert.    Laboratory studies: None  Imaging:  None  Pathology: A. PILONIDAL CYST, EXCISION:  - Consistent with clinically stated pilonidal cyst   Assessment:  37 y.o. yo Female who presents for follow-up status post pilonidal cyst excision on 12/07/2022.  Plan:  -Sutures removed -I explained to the patient that given this is a high tension area, incision dehiscence can occur.  There is no evidence of infection at her surgical site -Discussed that this area will heal by secondary intention.  Advised her to keep the area clean and dry.  Will pack with dry gauze daily and as needed for saturation. -Advised her to call the office if she begins to have fever, chills, worsening pain or erythema around surgical site, or purulent drainage -Refill provided for her  narcotic pain medication -Follow up with me in 1 month  All of the above recommendations were discussed with the patient and patient's family, and all of patient's and family's questions were answered to their expressed satisfaction.  Graciella Freer, DO [facility] [address] [place], [place] [address] [phone] (office)

## 2023-01-24 ENCOUNTER — Encounter: Payer: Medicaid Other | Admitting: Surgery

## 2023-01-30 ENCOUNTER — Ambulatory Visit (INDEPENDENT_AMBULATORY_CARE_PROVIDER_SITE_OTHER): Payer: Medicaid Other | Admitting: Surgery

## 2023-01-30 ENCOUNTER — Encounter: Payer: Self-pay | Admitting: Surgery

## 2023-01-30 VITALS — BP 131/94 | HR 66 | Temp 98.3°F | Resp 12 | Ht 62.0 in | Wt 216.0 lb

## 2023-01-30 DIAGNOSIS — T8130XA Disruption of wound, unspecified, initial encounter: Secondary | ICD-10-CM

## 2023-01-30 NOTE — Progress Notes (Signed)
[facility] Note   HPI:  37 y.o. Female presents to clinic for post-op follow-up s/p pilonidal cyst excision on 12/21 with dehiscence of the wound.  She has been doing well since her last visit.  She is no longer packing the wound, as it is small in size.  She denies any fevers, chills, or surrounding erythema.  There is a small amount of white drainage.  Review of Systems:  All other review of systems: otherwise negative   Vital Signs:  BP (!) 131/94   Pulse 66   Temp 98.3 F (36.8 C) (Oral)   Resp 12   Ht 5' 2"$  (1.575 m)   Wt 216 lb (98 kg)   SpO2 96%   BMI 39.51 kg/m    Physical Exam:  Physical Exam Vitals reviewed.  Constitutional:      Appearance: Normal appearance.  Skin:    Comments: Previous surgical site with small, 1 cm area of granulation tissue, no evidence of purulence, or surrounding erythema or induration  Neurological:     Mental Status: She is alert.     Laboratory studies: None   Imaging:  None   Assessment:  37 y.o. yo Female who presents for follow-up status post pilonidal cyst excision on 12/21 with dehiscence of her wound.  Plan:  -Continue to keep the area clean and dry. -Apply antibiotic ointment after showering -Call if it begins to have surrounding redness, swelling, or purulent drainage -Follow up with me in 1 month to check the area  All of the above recommendations were discussed with the patient and patient's family, and all of patient's and family's questions were answered to their expressed satisfaction.  Graciella Freer, DO [facility] [address] [place], [place] [address] [phone] (office)

## 2023-02-12 ENCOUNTER — Ambulatory Visit: Payer: Medicaid Other

## 2023-02-14 ENCOUNTER — Ambulatory Visit (INDEPENDENT_AMBULATORY_CARE_PROVIDER_SITE_OTHER): Payer: Medicaid Other | Admitting: *Deleted

## 2023-02-14 DIAGNOSIS — Z3042 Encounter for surveillance of injectable contraceptive: Secondary | ICD-10-CM | POA: Diagnosis not present

## 2023-02-14 MED ORDER — MEDROXYPROGESTERONE ACETATE 150 MG/ML IM SUSY
150.0000 mg | PREFILLED_SYRINGE | Freq: Once | INTRAMUSCULAR | Status: AC
  Administered 2023-02-14: 150 mg via INTRAMUSCULAR

## 2023-02-14 NOTE — Progress Notes (Signed)
   NURSE VISIT- INJECTION  SUBJECTIVE:  Stephanie Cook is a 37 y.o. [phone] female here for a Depo Provera for contraception/period management. She is a GYN patient.   OBJECTIVE:  There were no vitals taken for this visit.  Appears well, in no apparent distress  Injection administered in: Left upper quad. gluteus  Meds ordered this encounter  Medications   medroxyPROGESTERone Acetate SUSY 150 mg    ASSESSMENT: GYN patient Depo Provera for contraception/period management PLAN: Follow-up: in 11-13 weeks for next Depo   Alice Rieger  02/14/2023 3:19 PM

## 2023-02-19 ENCOUNTER — Ambulatory Visit: Payer: Medicaid Other | Admitting: Adult Health

## 2023-02-22 ENCOUNTER — Encounter: Payer: Self-pay | Admitting: Adult Health

## 2023-02-22 ENCOUNTER — Ambulatory Visit (INDEPENDENT_AMBULATORY_CARE_PROVIDER_SITE_OTHER): Payer: Medicaid Other | Admitting: Adult Health

## 2023-02-22 VITALS — BP 132/99 | HR 86 | Ht 63.0 in | Wt 219.0 lb

## 2023-02-22 DIAGNOSIS — F32A Depression, unspecified: Secondary | ICD-10-CM | POA: Diagnosis not present

## 2023-02-22 DIAGNOSIS — R454 Irritability and anger: Secondary | ICD-10-CM | POA: Insufficient documentation

## 2023-02-22 DIAGNOSIS — F419 Anxiety disorder, unspecified: Secondary | ICD-10-CM | POA: Diagnosis not present

## 2023-02-22 MED ORDER — FLUOXETINE HCL 20 MG PO CAPS: 20.0000 mg | ORAL_CAPSULE | Freq: Every day | ORAL | 3 refills | Status: DC

## 2023-02-22 MED ORDER — HYDROXYZINE HCL 10 MG PO TABS: 10.0000 mg | ORAL_TABLET | Freq: Three times a day (TID) | ORAL | 3 refills | Status: DC | PRN

## 2023-02-22 NOTE — Progress Notes (Signed)
Subjective:     Patient ID: Stephanie Cook, female   DOB: 16-Jan-1986, 37 y.o.   MRN: [id number]  Stephanie Cook is a 66 year old black female, married, [id number], in with her wife, complaining of anxiety and depression and now anger. She has stopped Buspar and Paxil.   Last pap was negative HPV and NILM, 05/11/20.  Review of Systems +anxiety and depression and now anger She stopped Paxil and Buspar Reviewed past medical,surgical, social and family history. Reviewed medications and allergies.     Objective:   Physical Exam BP (!) 132/99 (BP Location: Right Arm, Patient Position: Sitting, Cuff Size: Large)   Pulse 86   Ht '5\' 3"'$  (1.6 m)   Wt 219 lb (99.3 kg)   BMI 38.79 kg/m   Skin warm and dry. Lungs: clear to ausculation bilaterally. Cardiovascular: regular rate and rhythm.     Fall risk is low    02/22/2023   10:27 AM 08/28/2022    8:56 AM 07/17/2022    9:08 AM  Depression screen PHQ 2/9  Decreased Interest '3 1 2  '$ Down, Depressed, Hopeless '3 1 3  '$ PHQ - 2 Score '6 2 5  '$ Altered sleeping '3 3 3  '$ Tired, decreased energy '3 2 3  '$ Change in appetite '3 1 1  '$ Feeling bad or failure about yourself  '3 3 3  '$ Trouble concentrating 0 0 1  Moving slowly or fidgety/restless 1 0 2  Suicidal thoughts 2 0 2  PHQ-9 Score '21 11 20       '$ 02/22/2023   10:29 AM 08/28/2022    8:58 AM 07/17/2022    9:10 AM 05/24/2022    9:05 AM  GAD 7 : Generalized Anxiety Score  Nervous, Anxious, on Edge '3 1 3 2  '$ Control/stop worrying '3 2 3 3  '$ Worry too much - different things '3 2 3 3  '$ Trouble relaxing '3 2 3 3  '$ Restless '3 1 2 3  '$ Easily annoyed or irritable '3 2 3 3  '$ Afraid - awful might happen 0 0 3 0  Total GAD 7 Score '18 10 20 17  '$ Anxiety Difficulty    Somewhat difficult      Upstream - 02/22/23 1027       Pregnancy Intention Screening   Does the patient want to become pregnant in the next year? No    Does the patient's partner want to become pregnant in the next year? No    Would the patient like to  discuss contraceptive options today? No      Contraception Wrap Up   Current Method Hormonal Injection    End Method Hormonal Injection    Contraception Counseling Provided No             Assessment:     1. Anxiety and depression She is having anxiety and depression and more anger, she stopped Buspar and Paxil, they were not helping She wants to try another medication Will rx Prozac and refer to [facility], and will rx vistaril for prn use  Meds ordered this encounter  Medications   FLUoxetine (PROZAC) 20 MG capsule    Sig: Take 1 capsule (20 mg total) by mouth daily.    Dispense:  30 capsule    Refill:  3    Order Specific Question:   Supervising Provider    Answer:   Tania Ade H [[id number]]   hydrOXYzine (ATARAX) 10 MG tablet    Sig: Take 1 tablet (10 mg total) by mouth  3 (three) times daily as needed.    Dispense:  30 tablet    Refill:  3    Order Specific Question:   Supervising Provider    Answer:   Florian Buff [[id number]]    If has not heard from [facility] in 2  weeks call office and speak with Terrence Dupont to hasten appt.   2. Anger Will try Prozac and refer to [facility]   Try walking more   Plan:     Follow up in 8 weeks for ROS

## 2023-02-27 ENCOUNTER — Ambulatory Visit: Payer: Medicaid Other | Admitting: Surgery

## 2023-02-28 ENCOUNTER — Encounter: Payer: Medicaid Other | Admitting: Surgery

## 2023-03-06 ENCOUNTER — Ambulatory Visit: Payer: Medicaid Other | Admitting: Family Medicine

## 2023-03-23 ENCOUNTER — Ambulatory Visit: Payer: Self-pay | Admitting: Physician Assistant

## 2023-04-26 ENCOUNTER — Ambulatory Visit (HOSPITAL_COMMUNITY): Payer: Medicaid Other | Admitting: Psychiatry

## 2023-04-26 ENCOUNTER — Encounter (HOSPITAL_COMMUNITY): Payer: Self-pay

## 2023-04-27 ENCOUNTER — Encounter: Payer: Self-pay | Admitting: Adult Health

## 2023-04-27 ENCOUNTER — Other Ambulatory Visit (HOSPITAL_COMMUNITY)
Admission: RE | Admit: 2023-04-27 | Discharge: 2023-04-27 | Disposition: A | Discharge status: Home / Self Care | Payer: Medicaid Other | Source: Ambulatory Visit | Attending: Adult Health | Admitting: Adult Health

## 2023-04-27 ENCOUNTER — Ambulatory Visit (INDEPENDENT_AMBULATORY_CARE_PROVIDER_SITE_OTHER): Payer: Medicaid Other | Admitting: Adult Health

## 2023-04-27 VITALS — BP 134/95 | HR 80 | Ht 63.0 in | Wt 216.0 lb

## 2023-04-27 DIAGNOSIS — N9489 Other specified conditions associated with female genital organs and menstrual cycle: Secondary | ICD-10-CM | POA: Diagnosis not present

## 2023-04-27 DIAGNOSIS — Z3042 Encounter for surveillance of injectable contraceptive: Secondary | ICD-10-CM | POA: Diagnosis not present

## 2023-04-27 DIAGNOSIS — Z124 Encounter for screening for malignant neoplasm of cervix: Secondary | ICD-10-CM | POA: Insufficient documentation

## 2023-04-27 DIAGNOSIS — N926 Irregular menstruation, unspecified: Secondary | ICD-10-CM

## 2023-04-27 MED ORDER — MEDROXYPROGESTERONE ACETATE 150 MG/ML IM SUSP: INTRAMUSCULAR | 4 refills | Status: DC

## 2023-04-27 MED ORDER — MEGESTROL ACETATE 40 MG PO TABS: ORAL_TABLET | ORAL | 0 refills | Status: DC

## 2023-04-27 NOTE — Progress Notes (Signed)
  Subjective:     Patient ID: Stephanie Cook, female   DOB: October 03, 1986, 37 y.o.   MRN: [id number]  HPI Stephanie Cook is a 37 year old black female, married, (has female partner), [phone] in complaining of bleeding on depo for about 4 days and cramping. She needs pap too.   Review of Systems Bleeding for 4 days on depo +cramping Reviewed past medical,surgical, social and family history. Reviewed medications and allergies.     Objective:   Physical Exam BP (!) 134/95 (BP Location: Left Arm, Patient Position: Sitting, Cuff Size: Large)   Pulse 80   Ht 5\' 3"  (1.6 m)   Wt 216 lb (98 kg)   LMP 04/24/2023   BMI 38.26 kg/m     Skin warm and dry.Pelvic: external genitalia is normal in appearance no lesions, vagina: +blood, no odor,urethra has no lesions or masses noted, cervix:smooth and bulbous,pap with HR HPV genotyping and GC/CHL performed,  uterus: normal size, shape and contour, non tender, no masses felt, adnexa: no masses or tenderness noted. Bladder is non tender and no masses felt.  Fall risk is low  Upstream - 04/27/23 1016       Pregnancy Intention Screening   Does the patient want to become pregnant in the next year? No    Does the patient's partner want to become pregnant in the next year? No    Would the patient like to discuss contraceptive options today? No      Contraception Wrap Up   Current Method Hormonal Injection    End Method Hormonal Injection            Examination chaperoned by Malachy Mood LPN  Assessment:     1. Routine Papanicolaou smear Pap sent Pap in 3 years if normal - Cytology - PAP( [facility])  2. Irregular bleeding Bleeding for 4 days on depo Will rx megace to stop bleeding  Meds ordered this encounter  Medications   medroxyPROGESTERone (DEPO-PROVERA) 150 MG/ML injection    Sig: INJECT INTO THE MUSCLE ONCE EVERY 3 MONTHS.    Dispense:  1 mL    Refill:  4    Order Specific Question:   Supervising Provider    Answer:   Despina Hidden,  LUTHER H [[id number]]   megestrol (MEGACE) 40 MG tablet    Sig: Take 3 x 5 days then 2 x 5 days then 1 daily till bleeding    Dispense:  45 tablet    Refill:  0    Order Specific Question:   Supervising Provider    Answer:   Despina Hidden, LUTHER H [[id number]]     3. Uterine cramping  4. Encounter for surveillance of injectable contraceptive Will refill depo Next injection scheduled for 05/04/23     Plan:     Return as scheduled 05/04/23 for depo

## 2023-04-30 LAB — CYTOLOGY - PAP
Chlamydia: NEGATIVE
Comment: NEGATIVE
Comment: NEGATIVE
Comment: NORMAL
Diagnosis: NEGATIVE
High risk HPV: NEGATIVE
Neisseria Gonorrhea: NEGATIVE

## 2023-05-04 ENCOUNTER — Ambulatory Visit: Payer: Medicaid Other

## 2023-05-08 ENCOUNTER — Ambulatory Visit (INDEPENDENT_AMBULATORY_CARE_PROVIDER_SITE_OTHER): Payer: Medicaid Other | Admitting: *Deleted

## 2023-05-08 DIAGNOSIS — Z3042 Encounter for surveillance of injectable contraceptive: Secondary | ICD-10-CM | POA: Diagnosis not present

## 2023-05-08 MED ORDER — MEDROXYPROGESTERONE ACETATE 150 MG/ML IM SUSY
150.0000 mg | PREFILLED_SYRINGE | Freq: Once | INTRAMUSCULAR | Status: AC
  Administered 2023-05-08: 150 mg via INTRAMUSCULAR

## 2023-05-08 NOTE — Progress Notes (Signed)
   NURSE VISIT- INJECTION  SUBJECTIVE:  Stephanie Cook is a 37 y.o. [phone] female here for a Depo Provera for contraception/period management. She is a GYN patient.   OBJECTIVE:  LMP 04/24/2023   Appears well, in no apparent distress  Injection administered in: Right upper quad. gluteus  Meds ordered this encounter  Medications   medroxyPROGESTERone Acetate SUSY 150 mg    ASSESSMENT: GYN patient Depo Provera for contraception/period management PLAN: Follow-up: in 11-13 weeks for next Depo   Jobe Marker  05/08/2023 10:39 AM

## 2023-05-18 ENCOUNTER — Other Ambulatory Visit: Payer: Self-pay | Admitting: Adult Health

## 2023-07-31 ENCOUNTER — Ambulatory Visit: Payer: Medicaid Other

## 2023-08-03 ENCOUNTER — Ambulatory Visit (INDEPENDENT_AMBULATORY_CARE_PROVIDER_SITE_OTHER): Payer: Medicaid Other | Admitting: *Deleted

## 2023-08-03 DIAGNOSIS — Z3042 Encounter for surveillance of injectable contraceptive: Secondary | ICD-10-CM | POA: Diagnosis not present

## 2023-08-03 MED ORDER — MEDROXYPROGESTERONE ACETATE 150 MG/ML IM SUSY
150.0000 mg | PREFILLED_SYRINGE | Freq: Once | INTRAMUSCULAR | Status: AC
  Administered 2023-08-03: 150 mg via INTRAMUSCULAR

## 2023-08-03 NOTE — Progress Notes (Signed)
   NURSE VISIT- INJECTION  SUBJECTIVE:  Stephanie Cook is a 37 y.o. [phone] female here for a Depo Provera for contraception/period management. She is a GYN patient.   OBJECTIVE:  There were no vitals taken for this visit.  Appears well, in no apparent distress  Injection administered in: Left upper quad. gluteus  Meds ordered this encounter  Medications   medroxyPROGESTERone Acetate SUSY 150 mg    ASSESSMENT: GYN patient Depo Provera for contraception/period management PLAN: Follow-up: in 11-13 weeks for next Depo   Annamarie Dawley  08/03/2023 8:53 AM

## 2023-09-07 ENCOUNTER — Ambulatory Visit: Payer: Medicaid Other | Admitting: Family Medicine

## 2023-10-11 ENCOUNTER — Telehealth: Payer: Self-pay | Admitting: Adult Health

## 2023-10-11 MED ORDER — AMLODIPINE BESYLATE 10 MG PO TABS: 10.0000 mg | ORAL_TABLET | Freq: Every day | ORAL | 1 refills | Status: DC

## 2023-10-11 NOTE — Telephone Encounter (Signed)
Pt needs a refill on Norvasc 10 mg. Thanks! JSY

## 2023-10-11 NOTE — Addendum Note (Signed)
Addended by: Cyril Mourning A on: 10/11/2023 01:21 PM   Modules accepted: Orders

## 2023-10-11 NOTE — Telephone Encounter (Signed)
Patient called and stated she needed a refill on amLODipine (NORVASC) 10 MG tablet. She has since moved to [place] and would like her medication sent to the [facility] in [place]. Please advise.

## 2023-10-11 NOTE — Telephone Encounter (Signed)
Refilled norvasc

## 2023-10-25 ENCOUNTER — Ambulatory Visit: Payer: Medicaid Other

## 2023-10-25 DIAGNOSIS — Z3042 Encounter for surveillance of injectable contraceptive: Secondary | ICD-10-CM

## 2023-10-25 MED ORDER — MEDROXYPROGESTERONE ACETATE 150 MG/ML IM SUSY
150.0000 mg | PREFILLED_SYRINGE | Freq: Once | INTRAMUSCULAR | Status: AC
  Administered 2023-10-25: 150 mg via INTRAMUSCULAR

## 2023-10-25 NOTE — Progress Notes (Signed)
   NURSE VISIT- INJECTION  SUBJECTIVE:  Stephanie Cook is a 37 y.o. [phone] female here for a Depo Provera for contraception/period management. She is a GYN patient.   OBJECTIVE:  There were no vitals taken for this visit.  Appears well, in no apparent distress  Injection administered in: Right upper quad. gluteus  Meds ordered this encounter  Medications   medroxyPROGESTERone Acetate SUSY 150 mg    ASSESSMENT: GYN patient Depo Provera for contraception/period management PLAN: Follow-up: in 11-13 weeks for next Depo   Caralyn Guile  10/25/2023 3:46 PM

## 2023-11-26 ENCOUNTER — Other Ambulatory Visit: Payer: Self-pay | Admitting: Adult Health

## 2023-11-26 MED ORDER — ACYCLOVIR 400 MG PO TABS: 400.0000 mg | ORAL_TABLET | Freq: Three times a day (TID) | ORAL | 99 refills | Status: AC

## 2023-11-26 NOTE — Progress Notes (Signed)
Refilled zovirax  

## 2023-12-21 ENCOUNTER — Other Ambulatory Visit: Payer: Self-pay | Admitting: Adult Health

## 2024-01-03 ENCOUNTER — Ambulatory Visit: Payer: Medicaid Other | Admitting: Family Medicine

## 2024-01-17 ENCOUNTER — Ambulatory Visit: Payer: Medicaid Other

## 2024-01-18 ENCOUNTER — Ambulatory Visit: Payer: Medicaid Other | Admitting: *Deleted

## 2024-01-18 DIAGNOSIS — Z3042 Encounter for surveillance of injectable contraceptive: Secondary | ICD-10-CM

## 2024-01-18 MED ORDER — MEDROXYPROGESTERONE ACETATE 150 MG/ML IM SUSY
150.0000 mg | PREFILLED_SYRINGE | Freq: Once | INTRAMUSCULAR | Status: AC
  Administered 2024-01-18: 150 mg via INTRAMUSCULAR

## 2024-01-18 NOTE — Progress Notes (Addendum)
   NURSE VISIT- INJECTION  SUBJECTIVE:  Stephanie Cook is a 38 y.o. [phone] female here for a Depo Provera for contraception/period management. She is a GYN patient.   OBJECTIVE:  There were no vitals taken for this visit.  Appears well, in no apparent distress  Injection administered in: Left upper quad. gluteus  Meds ordered this encounter  Medications   medroxyPROGESTERone Acetate SUSY 150 mg    ASSESSMENT: GYN patient Depo Provera for contraception/period management PLAN: Follow-up: in 11-13 weeks for next Depo   Jobe Marker  01/18/2024 10:33 AM

## 2024-01-31 ENCOUNTER — Ambulatory Visit: Payer: Medicaid Other | Admitting: Family Medicine

## 2024-02-19 ENCOUNTER — Ambulatory Visit: Payer: Self-pay | Admitting: Nurse Practitioner

## 2024-04-11 ENCOUNTER — Ambulatory Visit: Payer: Medicaid Other

## 2024-04-14 ENCOUNTER — Ambulatory Visit

## 2024-04-15 ENCOUNTER — Ambulatory Visit: Admitting: *Deleted

## 2024-04-15 DIAGNOSIS — Z3042 Encounter for surveillance of injectable contraceptive: Secondary | ICD-10-CM

## 2024-04-15 MED ORDER — MEDROXYPROGESTERONE ACETATE 150 MG/ML IM SUSY
150.0000 mg | PREFILLED_SYRINGE | Freq: Once | INTRAMUSCULAR | Status: AC
  Administered 2024-04-15: 150 mg via INTRAMUSCULAR

## 2024-04-15 NOTE — Progress Notes (Signed)
   NURSE VISIT- INJECTION  SUBJECTIVE:  Stephanie Cook is a 38 y.o. [phone] female here for a Depo Provera  for contraception/period management. She is a GYN patient.   OBJECTIVE:  There were no vitals taken for this visit.  Appears well, in no apparent distress  Injection administered in: Right upper quad. gluteus  Meds ordered this encounter  Medications   medroxyPROGESTERone  Acetate SUSY 150 mg    ASSESSMENT: GYN patient Depo Provera  for contraception/period management PLAN: Follow-up: in 11-13 weeks for next Depo. Pt needs refills on Depo.    Stephanie Cook  04/15/2024 9:44 AM

## 2024-05-07 ENCOUNTER — Other Ambulatory Visit (HOSPITAL_COMMUNITY)
Admission: RE | Admit: 2024-05-07 | Discharge: 2024-05-07 | Disposition: A | Discharge status: Home / Self Care | Source: Ambulatory Visit | Attending: Obstetrics & Gynecology | Admitting: Obstetrics & Gynecology

## 2024-05-07 ENCOUNTER — Ambulatory Visit

## 2024-05-07 DIAGNOSIS — N898 Other specified noninflammatory disorders of vagina: Secondary | ICD-10-CM

## 2024-05-07 NOTE — Progress Notes (Signed)
   NURSE VISIT- VAGINITIS SUBJECTIVE:  Stephanie Cook is a 38 y.o. [id number] GYN patientfemale here for a vaginal swab for vaginitis screening.  She reports the following symptoms: odor for 1 week. Patient states she has a fishy odor even after showering. Denies any reason for STD screening.  Denies abnormal vaginal bleeding, significant pelvic pain, fever, or UTI symptoms.  OBJECTIVE:  There were no vitals taken for this visit.  Appears well, in no apparent distress  ASSESSMENT: Vaginal swab for vaginitis screening  PLAN: Self-collected vaginal probe for Bacterial Vaginosis, Yeast sent to lab Treatment: to be determined once results are received Follow-up as needed if symptoms persist/worsen, or new symptoms develop  Alyssa Jumper  05/07/2024 3:35 PM

## 2024-05-08 LAB — CERVICOVAGINAL ANCILLARY ONLY
Bacterial Vaginitis (gardnerella): POSITIVE — AB
Candida Glabrata: NEGATIVE
Candida Vaginitis: NEGATIVE
Comment: NEGATIVE
Comment: NEGATIVE
Comment: NEGATIVE

## 2024-05-09 MED ORDER — METRONIDAZOLE 0.75 % VA GEL: 1.0000 | Freq: Every day | VAGINAL | 5 refills | Status: AC

## 2024-05-13 ENCOUNTER — Ambulatory Visit: Payer: Self-pay | Admitting: Adult Health

## 2024-05-13 MED ORDER — METRONIDAZOLE 500 MG PO TABS: 500.0000 mg | ORAL_TABLET | Freq: Two times a day (BID) | ORAL | 0 refills | Status: AC

## 2024-06-23 ENCOUNTER — Other Ambulatory Visit: Payer: Self-pay | Admitting: Adult Health

## 2024-07-08 ENCOUNTER — Ambulatory Visit

## 2024-07-08 DIAGNOSIS — Z3042 Encounter for surveillance of injectable contraceptive: Secondary | ICD-10-CM | POA: Diagnosis not present

## 2024-07-08 MED ORDER — MEDROXYPROGESTERONE ACETATE 150 MG/ML IM SUSY
150.0000 mg | PREFILLED_SYRINGE | Freq: Once | INTRAMUSCULAR | Status: AC
  Administered 2024-07-08: 150 mg via INTRAMUSCULAR

## 2024-07-08 NOTE — Progress Notes (Signed)
   NURSE VISIT- INJECTION  SUBJECTIVE:  Stephanie Cook is a 38 y.o. [phone] female here for a Depo Provera  for contraception/period management. She is a GYN patient.   OBJECTIVE:  There were no vitals taken for this visit.  Appears well, in no apparent distress  Injection administered in: Left upper quad. gluteus  Meds ordered this encounter  Medications   medroxyPROGESTERone  Acetate SUSY 150 mg    ASSESSMENT: GYN patient Depo Provera  for contraception/period management PLAN: Follow-up: in 11-13 weeks for next Depo   Stephanie Cook Blase  07/08/2024 9:26 AM

## 2024-07-20 ENCOUNTER — Other Ambulatory Visit: Payer: Self-pay | Admitting: Medical Genetics

## 2024-07-22 ENCOUNTER — Ambulatory Visit: Admitting: Nurse Practitioner

## 2024-09-30 ENCOUNTER — Ambulatory Visit

## 2024-10-01 ENCOUNTER — Ambulatory Visit (INDEPENDENT_AMBULATORY_CARE_PROVIDER_SITE_OTHER)

## 2024-10-01 DIAGNOSIS — Z3042 Encounter for surveillance of injectable contraceptive: Secondary | ICD-10-CM | POA: Diagnosis not present

## 2024-10-01 MED ORDER — MEDROXYPROGESTERONE ACETATE 150 MG/ML IM SUSY
150.0000 mg | PREFILLED_SYRINGE | Freq: Once | INTRAMUSCULAR | Status: AC
  Administered 2024-10-01: 150 mg via INTRAMUSCULAR

## 2024-10-01 NOTE — Progress Notes (Signed)
   NURSE VISIT- INJECTION  SUBJECTIVE:  Stephanie Cook is a 38 y.o. [phone] female here for a Depo Provera  for contraception/period management. She is a GYN patient.   OBJECTIVE:  There were no vitals taken for this visit.  Appears well, in no apparent distress  Injection administered in: Right upper quad. gluteus  Meds ordered this encounter  Medications   medroxyPROGESTERone  Acetate SUSY 150 mg    ASSESSMENT: GYN patient Depo Provera  for contraception/period management PLAN: Follow-up: in 11-13 weeks for next Depo   Aleck FORBES Blase  10/01/2024 10:47 AM

## 2024-10-03 ENCOUNTER — Other Ambulatory Visit: Payer: Self-pay | Admitting: Medical Genetics

## 2024-10-03 DIAGNOSIS — Z006 Encounter for examination for normal comparison and control in clinical research program: Secondary | ICD-10-CM

## 2024-10-22 ENCOUNTER — Other Ambulatory Visit: Payer: Self-pay | Admitting: Adult Health

## 2024-12-24 ENCOUNTER — Ambulatory Visit: Admitting: *Deleted

## 2024-12-24 DIAGNOSIS — Z3042 Encounter for surveillance of injectable contraceptive: Secondary | ICD-10-CM

## 2024-12-24 MED ORDER — MEDROXYPROGESTERONE ACETATE 150 MG/ML IM SUSY
150.0000 mg | PREFILLED_SYRINGE | Freq: Once | INTRAMUSCULAR | Status: AC
  Administered 2024-12-24: 150 mg via INTRAMUSCULAR

## 2024-12-24 NOTE — Progress Notes (Signed)
" ° °  NURSE VISIT- INJECTION  SUBJECTIVE:  Stephanie Cook is a 39 y.o. [phone] female here for a Depo Provera  for contraception/period management. She is a GYN patient.   OBJECTIVE:  There were no vitals taken for this visit.  Appears well, in no apparent distress  Injection administered in: Left upper quad. gluteus  Meds ordered this encounter  Medications   medroxyPROGESTERone  Acetate SUSY 150 mg    ASSESSMENT: GYN patient Depo Provera  for contraception/period management PLAN: Follow-up: in 11-13 weeks for next Depo   Alan LITTIE Fischer  12/24/2024 10:25 AM  "

## 2025-03-18 ENCOUNTER — Ambulatory Visit
